# Patient Record
Sex: Female | Born: 1955 | Race: White | Hispanic: No | Marital: Married | Smoking: Former smoker
Health system: Southern US, Community
[De-identification: ages and names within clinical notes are randomized; demographics above are authoritative.]

## PROBLEM LIST (undated history)

## (undated) DIAGNOSIS — Z87442 Personal history of urinary calculi: Secondary | ICD-10-CM

## (undated) DIAGNOSIS — H269 Unspecified cataract: Secondary | ICD-10-CM

## (undated) DIAGNOSIS — H409 Unspecified glaucoma: Secondary | ICD-10-CM

## (undated) DIAGNOSIS — M810 Age-related osteoporosis without current pathological fracture: Secondary | ICD-10-CM

## (undated) DIAGNOSIS — F32A Depression, unspecified: Secondary | ICD-10-CM

## (undated) DIAGNOSIS — F329 Major depressive disorder, single episode, unspecified: Secondary | ICD-10-CM

## (undated) HISTORY — DX: Age-related osteoporosis without current pathological fracture: M81.0

## (undated) HISTORY — PX: HEMORRHOID SURGERY: SHX153

## (undated) HISTORY — PX: ABDOMINAL HYSTERECTOMY: SHX81

## (undated) HISTORY — DX: Major depressive disorder, single episode, unspecified: F32.9

## (undated) HISTORY — PX: CATARACT EXTRACTION: SUR2

## (undated) HISTORY — DX: Depression, unspecified: F32.A

## (undated) HISTORY — DX: Unspecified glaucoma: H40.9

## (undated) HISTORY — DX: Unspecified cataract: H26.9

---
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
## 2009-06-26 LAB — HM COLONOSCOPY

## 2010-05-29 ENCOUNTER — Ambulatory Visit: Payer: Self-pay | Admitting: Family Medicine

## 2010-06-12 ENCOUNTER — Ambulatory Visit: Payer: Self-pay | Admitting: Family Medicine

## 2010-06-20 ENCOUNTER — Ambulatory Visit: Payer: Self-pay | Admitting: Family Medicine

## 2011-07-01 ENCOUNTER — Ambulatory Visit: Payer: Self-pay | Admitting: Family Medicine

## 2011-07-03 ENCOUNTER — Ambulatory Visit: Payer: Self-pay | Admitting: Family Medicine

## 2012-01-08 ENCOUNTER — Ambulatory Visit: Payer: Self-pay | Admitting: Family Medicine

## 2012-07-28 ENCOUNTER — Ambulatory Visit: Payer: Self-pay | Admitting: Family Medicine

## 2013-07-12 ENCOUNTER — Ambulatory Visit: Payer: Self-pay | Admitting: Neurology

## 2013-07-29 ENCOUNTER — Ambulatory Visit: Payer: Self-pay | Admitting: Family Medicine

## 2013-09-13 DIAGNOSIS — G3184 Mild cognitive impairment, so stated: Secondary | ICD-10-CM | POA: Insufficient documentation

## 2014-08-01 ENCOUNTER — Ambulatory Visit
Admit: 2014-08-01 | Disposition: A | Discharge status: Home / Self Care | Payer: Self-pay | Attending: Family Medicine | Admitting: Family Medicine

## 2014-08-01 LAB — HM MAMMOGRAPHY

## 2014-10-28 ENCOUNTER — Other Ambulatory Visit: Payer: Self-pay | Admitting: Family Medicine

## 2015-01-18 ENCOUNTER — Encounter: Payer: Self-pay | Admitting: Family Medicine

## 2015-01-18 ENCOUNTER — Ambulatory Visit (INDEPENDENT_AMBULATORY_CARE_PROVIDER_SITE_OTHER): Payer: 59 | Admitting: Family Medicine

## 2015-01-18 VITALS — BP 130/83 | HR 67 | Temp 98.3°F | Ht 62.0 in | Wt 140.6 lb

## 2015-01-18 DIAGNOSIS — F329 Major depressive disorder, single episode, unspecified: Secondary | ICD-10-CM | POA: Diagnosis not present

## 2015-01-18 DIAGNOSIS — F32A Depression, unspecified: Secondary | ICD-10-CM

## 2015-01-18 DIAGNOSIS — Z23 Encounter for immunization: Secondary | ICD-10-CM

## 2015-01-18 DIAGNOSIS — F339 Major depressive disorder, recurrent, unspecified: Secondary | ICD-10-CM | POA: Insufficient documentation

## 2015-01-18 MED ORDER — OLANZAPINE 2.5 MG PO TABS: 2.5000 mg | ORAL_TABLET | Freq: Every day | ORAL | Status: DC

## 2015-01-18 MED ORDER — FLUOXETINE HCL 10 MG PO CAPS: 10.0000 mg | ORAL_CAPSULE | Freq: Every day | ORAL | Status: DC

## 2015-01-18 NOTE — Assessment & Plan Note (Signed)
Discussed depression care and treatment tapering medications will continue Lamictal 200 mg Will separate olanzapine and fluoxetine and 2 separate components at decreased dose to take olanzapine 2.5 mg a day fluoxetine 10 mg once a day. Patient will continue slow taper as possible.

## 2015-01-18 NOTE — Progress Notes (Signed)
BP 130/83 mmHg  Pulse 67  Temp(Src) 98.3 F (36.8 C)  Ht 5\' 2"  (1.575 m)  Wt 140 lb 9.6 oz (63.776 kg)  BMI 25.71 kg/m2  SpO2 97%   Subjective:    Patient ID: Rhonda Reeves, female    DOB: 22-Dec-1955, 59 y.o.   MRN: [id number]  HPI: Rhonda Reeves is a 59 y.o. female  Chief Complaint  Patient presents with  . Depression    Pt. reports no problems   Depression doing well. Patient sleeping well with no complaints Is retired so no job hassles. But is looking for a job. Has been tapering medications continuing Lamictal 200 mg and decreasing olanzapine and fluoxetine combination 6/25-1 tablet every other day. Wants to continue tapering as she is been doing really well. In family says she is doing well also.  Relevant past medical, surgical, family and social history reviewed and updated as indicated. Interim medical history since our last visit reviewed. Allergies and medications reviewed and updated.  Review of Systems  Constitutional: Negative.  Negative for fatigue.  HENT: Negative.   Respiratory: Negative.   Cardiovascular: Negative.   Psychiatric/Behavioral: Negative for suicidal ideas, behavioral problems, sleep disturbance, dysphoric mood, decreased concentration and agitation. The patient is not nervous/anxious.     Per HPI unless specifically indicated above     Objective:    BP 130/83 mmHg  Pulse 67  Temp(Src) 98.3 F (36.8 C)  Ht 5\' 2"  (1.575 m)  Wt 140 lb 9.6 oz (63.776 kg)  BMI 25.71 kg/m2  SpO2 97%  Wt Readings from Last 3 Encounters:  01/18/15 140 lb 9.6 oz (63.776 kg)  01/17/15 148 lb (67.132 kg)    Physical Exam  Constitutional: She is oriented to person, place, and time. She appears well-developed and well-nourished. No distress.  HENT:  Head: Normocephalic and atraumatic.  Right Ear: Hearing normal.  Left Ear: Hearing normal.  Nose: Nose normal.  Eyes: Conjunctivae and lids are normal. Right eye exhibits no discharge. Left eye exhibits  no discharge. No scleral icterus.  Cardiovascular: Normal rate, regular rhythm and normal heart sounds.   Pulmonary/Chest: Effort normal and breath sounds normal. No respiratory distress.  Musculoskeletal: Normal range of motion.  Neurological: She is alert and oriented to person, place, and time.  Skin: Skin is intact. No rash noted.  Psychiatric: She has a normal mood and affect. Her speech is normal and behavior is normal. Judgment and thought content normal. Cognition and memory are normal.    Results for orders placed or performed in visit on 01/17/15  HM MAMMOGRAPHY  Result Value Ref Range   HM Mammogram PP   HM COLONOSCOPY  Result Value Ref Range   HM Colonoscopy PP       Assessment & Plan:   Problem List Items Addressed This Visit      Other   Depression    Discussed depression care and treatment tapering medications will continue Lamictal 200 mg Will separate olanzapine and fluoxetine and 2 separate components at decreased dose to take olanzapine 2.5 mg a day fluoxetine 10 mg once a day. Patient will continue slow taper as possible.      Relevant Medications   OLANZapine (ZYPREXA) 2.5 MG tablet   FLUoxetine (PROZAC) 10 MG capsule    Other Visit Diagnoses    Need for influenza vaccination    -  Primary    Relevant Orders    Flu Vaccine QUAD 36+ mos PF IM (Fluarix & Fluzone Quad  PF) (Completed)        Follow up plan: Return in about 6 months (around 07/18/2015), or if symptoms worsen or fail to improve, for Physical Exam.

## 2015-03-29 ENCOUNTER — Ambulatory Visit (INDEPENDENT_AMBULATORY_CARE_PROVIDER_SITE_OTHER): Payer: 59

## 2015-03-29 DIAGNOSIS — Z23 Encounter for immunization: Secondary | ICD-10-CM

## 2015-04-05 ENCOUNTER — Ambulatory Visit: Payer: 59

## 2015-07-03 ENCOUNTER — Ambulatory Visit (INDEPENDENT_AMBULATORY_CARE_PROVIDER_SITE_OTHER): Payer: BLUE CROSS/BLUE SHIELD | Admitting: Family Medicine

## 2015-07-03 ENCOUNTER — Encounter: Payer: Self-pay | Admitting: Family Medicine

## 2015-07-03 VITALS — BP 124/80 | HR 70 | Temp 98.0°F | Ht 61.3 in | Wt 132.0 lb

## 2015-07-03 DIAGNOSIS — F329 Major depressive disorder, single episode, unspecified: Secondary | ICD-10-CM

## 2015-07-03 DIAGNOSIS — F32A Depression, unspecified: Secondary | ICD-10-CM

## 2015-07-03 MED ORDER — FLUOXETINE HCL 10 MG PO CAPS: 10.0000 mg | ORAL_CAPSULE | Freq: Every day | ORAL | Status: DC

## 2015-07-03 MED ORDER — OLANZAPINE 2.5 MG PO TABS: 2.5000 mg | ORAL_TABLET | Freq: Every day | ORAL | Status: DC

## 2015-07-03 MED ORDER — LAMOTRIGINE 200 MG PO TABS: ORAL_TABLET | ORAL | Status: DC

## 2015-07-03 NOTE — Assessment & Plan Note (Signed)
Patient stable doing well will stop Zyprexa after review with patient patient will hold Zyprexa in her medicine cabinet and if gets any signs of increasing irritability and poor sleep etc. Will restart Continue other medications long-term

## 2015-07-03 NOTE — Progress Notes (Signed)
   BP 124/80 mmHg  Pulse 70  Temp(Src) 98 F (36.7 C)  Ht 5' 1.3" (1.557 m)  Wt 132 lb (59.875 kg)  BMI 24.70 kg/m2  SpO2 99%   Subjective:    Patient ID: Rhonda Reeves, female    DOB: 1956/03/03, 60 y.o.   MRN: [id number]  HPI: Rhonda Reeves is a 60 y.o. female  Chief Complaint  Patient presents with  . 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 Patient wants to continue reducing dose. Relevant past medical, surgical, family and social history reviewed and updated as indicated. Interim medical history since our last visit reviewed. Allergies and medications reviewed and updated.  Review of Systems  Constitutional: Negative.   Respiratory: Negative.   Cardiovascular: Negative.     Per HPI unless specifically indicated above     Objective:    BP 124/80 mmHg  Pulse 70  Temp(Src) 98 F (36.7 C)  Ht 5' 1.3" (1.557 m)  Wt 132 lb (59.875 kg)  BMI 24.70 kg/m2  SpO2 99%  Wt Readings from Last 3 Encounters:  07/03/15 132 lb (59.875 kg)  01/18/15 140 lb 9.6 oz (63.776 kg)  01/17/15 148 lb (67.132 kg)    Physical Exam  Constitutional: She is oriented to person, place, and time. She appears well-developed and well-nourished. No distress.  HENT:  Head: Normocephalic and atraumatic.  Right Ear: Hearing normal.  Left Ear: Hearing normal.  Nose: Nose normal.  Eyes: Conjunctivae and lids are normal. Right eye exhibits no discharge. Left eye exhibits no discharge. No scleral icterus.  Cardiovascular: Normal rate, regular rhythm and normal heart sounds.   Pulmonary/Chest: Effort normal and breath sounds normal. No respiratory distress.  Musculoskeletal: Normal range of  motion.  Neurological: She is alert and oriented to person, place, and time.  Skin: Skin is intact. No rash noted.  Psychiatric: She has a normal mood and affect. Her speech is normal and behavior is normal. Judgment and thought content normal. Cognition and memory are normal.    Results for orders placed or performed in visit on 01/17/15  HM MAMMOGRAPHY  Result Value Ref Range   HM Mammogram PP   HM COLONOSCOPY  Result Value Ref Range   HM Colonoscopy PP       Assessment & Plan:   Problem List Items Addressed This Visit      Other   Depression - Primary    Patient stable doing well will stop Zyprexa after review with patient patient will hold Zyprexa in her medicine cabinet and if gets any signs of increasing irritability and poor sleep etc. Will restart Continue other medications long-term      Relevant Medications   FLUoxetine (PROZAC) 10 MG capsule   OLANZapine (ZYPREXA) 2.5 MG tablet       Follow up plan: Return for Physical Exam spring.

## 2015-07-05 ENCOUNTER — Encounter: Payer: 59 | Admitting: Family Medicine

## 2015-07-20 ENCOUNTER — Encounter: Payer: Self-pay | Admitting: Family Medicine

## 2015-10-18 ENCOUNTER — Encounter: Payer: BLUE CROSS/BLUE SHIELD | Admitting: Family Medicine

## 2016-01-08 ENCOUNTER — Encounter (INDEPENDENT_AMBULATORY_CARE_PROVIDER_SITE_OTHER): Payer: Self-pay

## 2016-01-30 ENCOUNTER — Encounter: Payer: Self-pay | Admitting: Family Medicine

## 2016-01-30 ENCOUNTER — Ambulatory Visit (INDEPENDENT_AMBULATORY_CARE_PROVIDER_SITE_OTHER): Payer: BLUE CROSS/BLUE SHIELD | Admitting: Family Medicine

## 2016-01-30 VITALS — BP 108/67 | HR 78 | Temp 98.0°F | Ht 61.5 in | Wt 126.8 lb

## 2016-01-30 DIAGNOSIS — Z0000 Encounter for general adult medical examination without abnormal findings: Secondary | ICD-10-CM

## 2016-01-30 DIAGNOSIS — F3341 Major depressive disorder, recurrent, in partial remission: Secondary | ICD-10-CM | POA: Diagnosis not present

## 2016-01-30 DIAGNOSIS — Z23 Encounter for immunization: Secondary | ICD-10-CM

## 2016-01-30 LAB — MICROSCOPIC EXAMINATION

## 2016-01-30 LAB — URINALYSIS, ROUTINE W REFLEX MICROSCOPIC
Bilirubin, UA: NEGATIVE
Glucose, UA: NEGATIVE
Ketones, UA: NEGATIVE
Nitrite, UA: NEGATIVE
Protein, UA: NEGATIVE
RBC, UA: NEGATIVE
Specific Gravity, UA: 1.01 (ref 1.005–1.030)
Urobilinogen, Ur: 0.2 mg/dL (ref 0.2–1.0)
pH, UA: 7 (ref 5.0–7.5)

## 2016-01-30 MED ORDER — LAMOTRIGINE 200 MG PO TABS: ORAL_TABLET | ORAL | 4 refills | Status: DC

## 2016-01-30 MED ORDER — OLANZAPINE 2.5 MG PO TABS: 2.5000 mg | ORAL_TABLET | Freq: Every day | ORAL | 4 refills | Status: DC

## 2016-01-30 MED ORDER — FLUOXETINE HCL 10 MG PO CAPS: 10.0000 mg | ORAL_CAPSULE | Freq: Every day | ORAL | 4 refills | Status: DC

## 2016-01-30 NOTE — Assessment & Plan Note (Signed)
Discussed with patient Will get back up on Zyprexa 2.5 one a day

## 2016-01-30 NOTE — Progress Notes (Signed)
BP 108/67 (BP Location: Left Arm, Patient Position: Sitting, Cuff Size: Normal)   Pulse 78   Temp 98 F (36.7 C)   Ht 5' 1.5" (1.562 m)   Wt 126 lb 12.8 oz (57.5 kg)   SpO2 98%   BMI 23.57 kg/m    Subjective:    Patient ID: Rhonda Reeves, female    DOB: 1955/06/27, 60 y.o.   MRN: [id number]  HPI: Rhonda Reeves is a 60 y.o. female  Chief Complaint  Patient presents with  . Annual Exam   Review patient has been trying to taper off her nerve medications has been taking Zyprexa 2.5 every other day. Patient has noticed recurrence of her symptoms of irritability and poor sleep and an severe frustration. Otherwise doing well is continuing on motor Romie Minus and fluoxetine. Has done really well over the last year with with almost 20 pounds of weight loss. Relevant past medical, surgical, family and social history reviewed and updated as indicated. Interim medical history since our last visit reviewed. Allergies and medications reviewed and updated.  Review of Systems  Constitutional: Negative.   HENT: Negative.   Eyes: Negative.   Respiratory: Negative.   Cardiovascular: Negative.   Gastrointestinal: Negative.   Endocrine: Negative.   Genitourinary: Negative.   Musculoskeletal: Negative.   Skin: Negative.   Allergic/Immunologic: Negative.   Neurological: Negative.   Hematological: Negative.   Psychiatric/Behavioral: Negative.     Per HPI unless specifically indicated above     Objective:    BP 108/67 (BP Location: Left Arm, Patient Position: Sitting, Cuff Size: Normal)   Pulse 78   Temp 98 F (36.7 C)   Ht 5' 1.5" (1.562 m)   Wt 126 lb 12.8 oz (57.5 kg)   SpO2 98%   BMI 23.57 kg/m   Wt Readings from Last 3 Encounters:  01/30/16 126 lb 12.8 oz (57.5 kg)  07/03/15 132 lb (59.9 kg)  01/18/15 140 lb 9.6 oz (63.8 kg)    Physical Exam  Constitutional: She is oriented to person, place, and time. She appears well-developed and well-nourished.  HENT:  Head:  Normocephalic and atraumatic.  Right Ear: External ear normal.  Left Ear: External ear normal.  Nose: Nose normal.  Mouth/Throat: Oropharynx is clear and moist.  Eyes: Conjunctivae and EOM are normal. Pupils are equal, round, and reactive to light.  Neck: Normal range of motion. Neck supple. Carotid bruit is not present.  Cardiovascular: Normal rate, regular rhythm and normal heart sounds.   No murmur heard. Pulmonary/Chest: Effort normal and breath sounds normal. She exhibits no mass. Right breast exhibits no mass, no skin change and no tenderness. Left breast exhibits no mass, no skin change and no tenderness. Breasts are symmetrical.  Abdominal: Soft. Bowel sounds are normal. There is no hepatosplenomegaly.  Musculoskeletal: Normal range of motion.  Neurological: She is alert and oriented to person, place, and time.  Skin: No rash noted.  Psychiatric: She has a normal mood and affect. Her behavior is normal. Judgment and thought content normal.        Assessment & Plan:   Problem List Items Addressed This Visit      Other   Depression    Discussed with patient Will get back up on Zyprexa 2.5 one a day       Relevant Medications   OLANZapine (ZYPREXA) 2.5 MG tablet   lamoTRIgine (LAMICTAL) 200 MG tablet   FLUoxetine (PROZAC) 10 MG capsule    Other Visit Diagnoses  Need for influenza vaccination    -  Primary   Relevant Orders   Flu Vaccine QUAD 36+ mos PF IM (Fluarix & Fluzone Quad PF) (Completed)   Annual physical exam       Relevant Orders   CBC with Differential/Platelet   Comprehensive metabolic panel   Lipid panel   TSH   Urinalysis, Routine w reflex microscopic (not at [facility])       Follow up plan: Return in about 6 months (around 07/30/2016) for med check.

## 2016-01-31 ENCOUNTER — Encounter: Payer: Self-pay | Admitting: Family Medicine

## 2016-01-31 LAB — CBC WITH DIFFERENTIAL/PLATELET
Basophils Absolute: 0 10*3/uL (ref 0.0–0.2)
Basos: 0 %
EOS (ABSOLUTE): 0.2 10*3/uL (ref 0.0–0.4)
Eos: 3 %
Hematocrit: 41.6 % (ref 34.0–46.6)
Hemoglobin: 14.2 g/dL (ref 11.1–15.9)
Immature Grans (Abs): 0 10*3/uL (ref 0.0–0.1)
Immature Granulocytes: 0 %
Lymphocytes Absolute: 2.5 10*3/uL (ref 0.7–3.1)
Lymphs: 35 %
MCH: 30.6 pg (ref 26.6–33.0)
MCHC: 34.1 g/dL (ref 31.5–35.7)
MCV: 90 fL (ref 79–97)
Monocytes Absolute: 0.5 10*3/uL (ref 0.1–0.9)
Monocytes: 6 %
Neutrophils Absolute: 3.9 10*3/uL (ref 1.4–7.0)
Neutrophils: 56 %
Platelets: 285 10*3/uL (ref 150–379)
RBC: 4.64 x10E6/uL (ref 3.77–5.28)
RDW: 12.8 % (ref 12.3–15.4)
WBC: 7.1 10*3/uL (ref 3.4–10.8)

## 2016-01-31 LAB — COMPREHENSIVE METABOLIC PANEL
ALT: 18 IU/L (ref 0–32)
AST: 19 IU/L (ref 0–40)
Albumin/Globulin Ratio: 2.3 — ABNORMAL HIGH (ref 1.2–2.2)
Albumin: 4.9 g/dL — ABNORMAL HIGH (ref 3.6–4.8)
Alkaline Phosphatase: 61 IU/L (ref 39–117)
BUN/Creatinine Ratio: 21 (ref 12–28)
BUN: 19 mg/dL (ref 8–27)
Bilirubin Total: 0.2 mg/dL (ref 0.0–1.2)
CO2: 26 mmol/L (ref 18–29)
Calcium: 9.7 mg/dL (ref 8.7–10.3)
Chloride: 102 mmol/L (ref 96–106)
Creatinine, Ser: 0.9 mg/dL (ref 0.57–1.00)
GFR calc Af Amer: 80 mL/min/{1.73_m2} (ref >59)
GFR calc non Af Amer: 69 mL/min/{1.73_m2} (ref >59)
Globulin, Total: 2.1 g/dL (ref 1.5–4.5)
Glucose: 79 mg/dL (ref 65–99)
Potassium: 4.2 mmol/L (ref 3.5–5.2)
Sodium: 145 mmol/L — ABNORMAL HIGH (ref 134–144)
Total Protein: 7 g/dL (ref 6.0–8.5)

## 2016-01-31 LAB — LIPID PANEL
Chol/HDL Ratio: 2.8 ratio units (ref 0.0–4.4)
Cholesterol, Total: 223 mg/dL — ABNORMAL HIGH (ref 100–199)
HDL: 79 mg/dL (ref >39)
LDL Calculated: 131 mg/dL — ABNORMAL HIGH (ref 0–99)
Triglycerides: 67 mg/dL (ref 0–149)
VLDL Cholesterol Cal: 13 mg/dL (ref 5–40)

## 2016-01-31 LAB — TSH: TSH: 1.33 u[IU]/mL (ref 0.450–4.500)

## 2016-03-10 ENCOUNTER — Telehealth: Payer: Self-pay | Admitting: Family Medicine

## 2016-03-10 NOTE — Telephone Encounter (Signed)
Rhonda Reeves from [facility] called in regards to the office visit on 01/30/16. The pt stated this was suppose to be coded as a CPE but it isn't going through to [facility] as a cpe.

## 2016-03-10 NOTE — Telephone Encounter (Signed)
Alwyn Ren can you assist with this?

## 2016-03-18 ENCOUNTER — Encounter: Payer: Self-pay | Admitting: Family Medicine

## 2016-03-18 ENCOUNTER — Ambulatory Visit (INDEPENDENT_AMBULATORY_CARE_PROVIDER_SITE_OTHER): Payer: BLUE CROSS/BLUE SHIELD | Admitting: Family Medicine

## 2016-03-18 DIAGNOSIS — F3341 Major depressive disorder, recurrent, in partial remission: Secondary | ICD-10-CM

## 2016-03-18 MED ORDER — LAMOTRIGINE 200 MG PO TABS: ORAL_TABLET | ORAL | 4 refills | Status: DC

## 2016-03-18 MED ORDER — FLUOXETINE HCL 10 MG PO CAPS: 10.0000 mg | ORAL_CAPSULE | Freq: Every day | ORAL | 4 refills | Status: DC

## 2016-03-18 MED ORDER — OLANZAPINE 2.5 MG PO TABS
2.5000 mg | ORAL_TABLET | Freq: Every day | ORAL | 4 refills | Status: DC
Start: 2016-03-18 — End: 2017-02-19

## 2016-03-18 NOTE — Assessment & Plan Note (Signed)
Reviewed medication dosing and treatment will continue current medications long-term

## 2016-03-18 NOTE — Progress Notes (Signed)
BP 122/81 (BP Location: Right Arm, Patient Position: Sitting, Cuff Size: Normal)   Pulse 82   Temp 98.6 F (37 C)   Wt 127 lb 3.2 oz (57.7 kg)   SpO2 97%   BMI 23.65 kg/m    Subjective:    Patient ID: Rhonda Reeves, female    DOB: 12-13-1955, 60 y.o.   MRN: [id number]  HPI: Rhonda Reeves is a 60 y.o. female  Chief Complaint  Patient presents with  . Medication follow up    pt states that she is changing insurances for next year and needs to change her pharmacy to a mail order pharmacy instead of a drug store,   Patient needs update on medications for above has been doing well reviewed medications. Discuss dangers of stopping medication and patient agrees and remembers the 2 previous times she stopped her medications Reviewed dosing Relevant past medical, surgical, family and social history reviewed and updated as indicated. Interim medical history since our last visit reviewed. Allergies and medications reviewed and updated.  Review of Systems  Constitutional: Negative.   Respiratory: Negative.   Cardiovascular: Negative.     Per HPI unless specifically indicated above     Objective:    BP 122/81 (BP Location: Right Arm, Patient Position: Sitting, Cuff Size: Normal)   Pulse 82   Temp 98.6 F (37 C)   Wt 127 lb 3.2 oz (57.7 kg)   SpO2 97%   BMI 23.65 kg/m   Wt Readings from Last 3 Encounters:  03/18/16 127 lb 3.2 oz (57.7 kg)  01/30/16 126 lb 12.8 oz (57.5 kg)  07/03/15 132 lb (59.9 kg)    Physical Exam  Constitutional: She is oriented to person, place, and time. She appears well-developed and well-nourished. No distress.  HENT:  Head: Normocephalic and atraumatic.  Right Ear: Hearing normal.  Left Ear: Hearing normal.  Nose: Nose normal.  Eyes: Conjunctivae and lids are normal. Right eye exhibits no discharge. Left eye exhibits no discharge. No scleral icterus.  Pulmonary/Chest: Effort normal. No respiratory distress.  Musculoskeletal: Normal range of  motion.  Neurological: She is alert and oriented to person, place, and time.  Skin: Skin is intact. No rash noted.  Psychiatric: She has a normal mood and affect. Her speech is normal and behavior is normal. Judgment and thought content normal. Cognition and memory are normal.    Results for orders placed or performed in visit on 01/30/16  Microscopic Examination  Result Value Ref Range   WBC, UA 6-10 (A) 0 - 5 /hpf   RBC, UA 0-2 0 - 2 /hpf   Epithelial Cells (non renal) 0-10 0 - 10 /hpf   Bacteria, UA Few None seen/Few  CBC with Differential/Platelet  Result Value Ref Range   WBC 7.1 3.4 - 10.8 x10E3/uL   RBC 4.64 3.77 - 5.28 x10E6/uL   Hemoglobin 14.2 11.1 - 15.9 g/dL   Hematocrit 41.6 34.0 - 46.6 %   MCV 90 79 - 97 fL   MCH 30.6 26.6 - 33.0 pg   MCHC 34.1 31.5 - 35.7 g/dL   RDW 12.8 12.3 - 15.4 %   Platelets 285 150 - 379 x10E3/uL   Neutrophils 56 Not Estab. %   Lymphs 35 Not Estab. %   Monocytes 6 Not Estab. %   Eos 3 Not Estab. %   Basos 0 Not Estab. %   Neutrophils Absolute 3.9 1.4 - 7.0 x10E3/uL   Lymphocytes Absolute 2.5 0.7 - 3.1 x10E3/uL  Monocytes Absolute 0.5 0.1 - 0.9 x10E3/uL   EOS (ABSOLUTE) 0.2 0.0 - 0.4 x10E3/uL   Basophils Absolute 0.0 0.0 - 0.2 x10E3/uL   Immature Granulocytes 0 Not Estab. %   Immature Grans (Abs) 0.0 0.0 - 0.1 x10E3/uL  Comprehensive metabolic panel  Result Value Ref Range   Glucose 79 65 - 99 mg/dL   BUN 19 8 - 27 mg/dL   Creatinine, Ser 0.90 0.57 - 1.00 mg/dL   GFR calc non Af Amer 69 >59 mL/min/1.73   GFR calc Af Amer 80 >59 mL/min/1.73   BUN/Creatinine Ratio 21 12 - 28   Sodium 145 (H) 134 - 144 mmol/L   Potassium 4.2 3.5 - 5.2 mmol/L   Chloride 102 96 - 106 mmol/L   CO2 26 18 - 29 mmol/L   Calcium 9.7 8.7 - 10.3 mg/dL   Total Protein 7.0 6.0 - 8.5 g/dL   Albumin 4.9 (H) 3.6 - 4.8 g/dL   Globulin, Total 2.1 1.5 - 4.5 g/dL   Albumin/Globulin Ratio 2.3 (H) 1.2 - 2.2   Bilirubin Total 0.2 0.0 - 1.2 mg/dL   Alkaline  Phosphatase 61 39 - 117 IU/L   AST 19 0 - 40 IU/L   ALT 18 0 - 32 IU/L  Lipid panel  Result Value Ref Range   Cholesterol, Total 223 (H) 100 - 199 mg/dL   Triglycerides 67 0 - 149 mg/dL   HDL 79 >39 mg/dL   VLDL Cholesterol Cal 13 5 - 40 mg/dL   LDL Calculated 131 (H) 0 - 99 mg/dL   Chol/HDL Ratio 2.8 0.0 - 4.4 ratio units  TSH  Result Value Ref Range   TSH 1.330 0.450 - 4.500 uIU/mL  Urinalysis, Routine w reflex microscopic (not at [facility])  Result Value Ref Range   Specific Gravity, UA 1.010 1.005 - 1.030   pH, UA 7.0 5.0 - 7.5   Color, UA Yellow Yellow   Appearance Ur Clear Clear   Leukocytes, UA 3+ (A) Negative   Protein, UA Negative Negative/Trace   Glucose, UA Negative Negative   Ketones, UA Negative Negative   RBC, UA Negative Negative   Bilirubin, UA Negative Negative   Urobilinogen, Ur 0.2 0.2 - 1.0 mg/dL   Nitrite, UA Negative Negative   Microscopic Examination See below:       Assessment & Plan:   Problem List Items Addressed This Visit      Other   Depression    Reviewed medication dosing and treatment will continue current medications long-term      Relevant Medications   OLANZapine (ZYPREXA) 2.5 MG tablet   lamoTRIgine (LAMICTAL) 200 MG tablet   FLUoxetine (PROZAC) 10 MG capsule       Follow up plan: Return in about 6 months (around 09/15/2016) for BMP.

## 2016-07-16 ENCOUNTER — Encounter: Payer: Self-pay | Admitting: Family Medicine

## 2016-07-30 ENCOUNTER — Ambulatory Visit: Payer: BLUE CROSS/BLUE SHIELD | Admitting: Family Medicine

## 2016-08-05 ENCOUNTER — Encounter: Payer: Self-pay | Admitting: Family Medicine

## 2016-08-05 ENCOUNTER — Ambulatory Visit (INDEPENDENT_AMBULATORY_CARE_PROVIDER_SITE_OTHER): Payer: Self-pay | Admitting: Family Medicine

## 2016-08-05 VITALS — BP 112/75 | HR 73 | Ht 62.0 in | Wt 128.0 lb

## 2016-08-05 DIAGNOSIS — Z79899 Other long term (current) drug therapy: Secondary | ICD-10-CM

## 2016-08-05 DIAGNOSIS — F3342 Major depressive disorder, recurrent, in full remission: Secondary | ICD-10-CM

## 2016-08-05 NOTE — Assessment & Plan Note (Signed)
The current medical regimen is effective;  continue present plan and medications.  

## 2016-08-05 NOTE — Progress Notes (Signed)
BP 112/75   Pulse 73   Ht 5\' 2"  (1.575 m)   Wt 128 lb (58.1 kg)   SpO2 97%   BMI 23.41 kg/m    Subjective:    Patient ID: Rhonda Reeves, female    DOB: October 23, 1955, 61 y.o.   MRN: [id number]  HPI: Rhonda Reeves is a 61 y.o. female  Chief Complaint  Patient presents with  . Follow-up  Patient doing well with medications in spite of incredible home stress. Able to sleep and get through the day without problems. Patient looking for a new job parents moved into an assisted living facility.  Relevant past medical, surgical, family and social history reviewed and updated as indicated. Interim medical history since our last visit reviewed. Allergies and medications reviewed and updated.  Review of Systems  Constitutional: Negative.   Respiratory: Negative.   Cardiovascular: Negative.     Per HPI unless specifically indicated above     Objective:    BP 112/75   Pulse 73   Ht 5\' 2"  (1.575 m)   Wt 128 lb (58.1 kg)   SpO2 97%   BMI 23.41 kg/m   Wt Readings from Last 3 Encounters:  08/05/16 128 lb (58.1 kg)  03/18/16 127 lb 3.2 oz (57.7 kg)  01/30/16 126 lb 12.8 oz (57.5 kg)    Physical Exam  Constitutional: She is oriented to person, place, and time. She appears well-developed and well-nourished.  HENT:  Head: Normocephalic and atraumatic.  Eyes: Conjunctivae and EOM are normal.  Neck: Normal range of motion.  Cardiovascular: Normal rate, regular rhythm and normal heart sounds.   Pulmonary/Chest: Effort normal and breath sounds normal.  Musculoskeletal: Normal range of motion.  Neurological: She is alert and oriented to person, place, and time.  Skin: No erythema.  Psychiatric: She has a normal mood and affect. Her behavior is normal. Judgment and thought content normal.    Results for orders placed or performed in visit on 01/30/16  Microscopic Examination  Result Value Ref Range   WBC, UA 6-10 (A) 0 - 5 /hpf   RBC, UA 0-2 0 - 2 /hpf   Epithelial Cells (non  renal) 0-10 0 - 10 /hpf   Bacteria, UA Few None seen/Few  CBC with Differential/Platelet  Result Value Ref Range   WBC 7.1 3.4 - 10.8 x10E3/uL   RBC 4.64 3.77 - 5.28 x10E6/uL   Hemoglobin 14.2 11.1 - 15.9 g/dL   Hematocrit 41.6 34.0 - 46.6 %   MCV 90 79 - 97 fL   MCH 30.6 26.6 - 33.0 pg   MCHC 34.1 31.5 - 35.7 g/dL   RDW 12.8 12.3 - 15.4 %   Platelets 285 150 - 379 x10E3/uL   Neutrophils 56 Not Estab. %   Lymphs 35 Not Estab. %   Monocytes 6 Not Estab. %   Eos 3 Not Estab. %   Basos 0 Not Estab. %   Neutrophils Absolute 3.9 1.4 - 7.0 x10E3/uL   Lymphocytes Absolute 2.5 0.7 - 3.1 x10E3/uL   Monocytes Absolute 0.5 0.1 - 0.9 x10E3/uL   EOS (ABSOLUTE) 0.2 0.0 - 0.4 x10E3/uL   Basophils Absolute 0.0 0.0 - 0.2 x10E3/uL   Immature Granulocytes 0 Not Estab. %   Immature Grans (Abs) 0.0 0.0 - 0.1 x10E3/uL  Comprehensive metabolic panel  Result Value Ref Range   Glucose 79 65 - 99 mg/dL   BUN 19 8 - 27 mg/dL   Creatinine, Ser 0.90 0.57 - 1.00 mg/dL  GFR calc non Af Amer 69 >59 mL/min/1.73   GFR calc Af Amer 80 >59 mL/min/1.73   BUN/Creatinine Ratio 21 12 - 28   Sodium 145 (H) 134 - 144 mmol/L   Potassium 4.2 3.5 - 5.2 mmol/L   Chloride 102 96 - 106 mmol/L   CO2 26 18 - 29 mmol/L   Calcium 9.7 8.7 - 10.3 mg/dL   Total Protein 7.0 6.0 - 8.5 g/dL   Albumin 4.9 (H) 3.6 - 4.8 g/dL   Globulin, Total 2.1 1.5 - 4.5 g/dL   Albumin/Globulin Ratio 2.3 (H) 1.2 - 2.2   Bilirubin Total 0.2 0.0 - 1.2 mg/dL   Alkaline Phosphatase 61 39 - 117 IU/L   AST 19 0 - 40 IU/L   ALT 18 0 - 32 IU/L  Lipid panel  Result Value Ref Range   Cholesterol, Total 223 (H) 100 - 199 mg/dL   Triglycerides 67 0 - 149 mg/dL   HDL 79 >39 mg/dL   VLDL Cholesterol Cal 13 5 - 40 mg/dL   LDL Calculated 131 (H) 0 - 99 mg/dL   Chol/HDL Ratio 2.8 0.0 - 4.4 ratio units  TSH  Result Value Ref Range   TSH 1.330 0.450 - 4.500 uIU/mL  Urinalysis, Routine w reflex microscopic (not at [facility])  Result Value Ref Range    Specific Gravity, UA 1.010 1.005 - 1.030   pH, UA 7.0 5.0 - 7.5   Color, UA Yellow Yellow   Appearance Ur Clear Clear   Leukocytes, UA 3+ (A) Negative   Protein, UA Negative Negative/Trace   Glucose, UA Negative Negative   Ketones, UA Negative Negative   RBC, UA Negative Negative   Bilirubin, UA Negative Negative   Urobilinogen, Ur 0.2 0.2 - 1.0 mg/dL   Nitrite, UA Negative Negative   Microscopic Examination See below:       Assessment & Plan:   Problem List Items Addressed This Visit      Other   Depression    The current medical regimen is effective;  continue present plan and medications.        Other Visit Diagnoses    Medication management    -  Primary       Follow up plan: Return in about 6 months (around 02/04/2017) for Medicine  check.

## 2017-02-05 ENCOUNTER — Ambulatory Visit: Payer: Self-pay | Admitting: Family Medicine

## 2017-02-19 ENCOUNTER — Ambulatory Visit (INDEPENDENT_AMBULATORY_CARE_PROVIDER_SITE_OTHER): Payer: 59 | Admitting: Family Medicine

## 2017-02-19 ENCOUNTER — Encounter: Payer: Self-pay | Admitting: Family Medicine

## 2017-02-19 DIAGNOSIS — F419 Anxiety disorder, unspecified: Secondary | ICD-10-CM | POA: Diagnosis not present

## 2017-02-19 DIAGNOSIS — F3341 Major depressive disorder, recurrent, in partial remission: Secondary | ICD-10-CM

## 2017-02-19 MED ORDER — LAMOTRIGINE 200 MG PO TABS: ORAL_TABLET | ORAL | 1 refills | Status: DC

## 2017-02-19 MED ORDER — FLUOXETINE HCL 10 MG PO CAPS: 10.0000 mg | ORAL_CAPSULE | Freq: Every day | ORAL | 1 refills | Status: DC

## 2017-02-19 MED ORDER — OLANZAPINE 2.5 MG PO TABS: 2.5000 mg | ORAL_TABLET | Freq: Every day | ORAL | 1 refills | Status: DC

## 2017-02-19 NOTE — Addendum Note (Signed)
Addended by: Golden Pop A on: 02/19/2017 04:57 PM   Modules accepted: Orders

## 2017-02-19 NOTE — Progress Notes (Signed)
BP 130/84   Pulse 65   Wt 131 lb (59.4 kg)   SpO2 98%   BMI 23.96 kg/m    Subjective:    Patient ID: Rhonda Reeves, female    DOB: 23-Apr-1955, 61 y.o.   MRN: [id number]  HPI: Rhonda Reeves is a 61 y.o. female  Chief Complaint  Patient presents with  . Follow-up  Chronic depression anxiety. Patient doing well on medication. Having a lot of stress with father who is dying not eating in a nursing home mother also with dementia in the same nursing home. Patient's weight is staying stable is in a stable job now with benefits and ready to schedule for physical exam.  Relevant past medical, surgical, family and social history reviewed and updated as indicated. Interim medical history since our last visit reviewed. Allergies and medications reviewed and updated.  Review of Systems  Constitutional: Negative.   Respiratory: Negative.   Cardiovascular: Negative.     Per HPI unless specifically indicated above     Objective:    BP 130/84   Pulse 65   Wt 131 lb (59.4 kg)   SpO2 98%   BMI 23.96 kg/m   Wt Readings from Last 3 Encounters:  02/19/17 131 lb (59.4 kg)  08/05/16 128 lb (58.1 kg)  03/18/16 127 lb 3.2 oz (57.7 kg)    Physical Exam  Constitutional: She is oriented to person, place, and time. She appears well-developed and well-nourished.  HENT:  Head: Normocephalic and atraumatic.  Eyes: Conjunctivae and EOM are normal.  Neck: Normal range of motion.  Cardiovascular: Normal rate, regular rhythm and normal heart sounds.   Pulmonary/Chest: Effort normal and breath sounds normal.  Musculoskeletal: Normal range of motion.  Neurological: She is alert and oriented to person, place, and time.  Skin: No erythema.  Psychiatric: She has a normal mood and affect. Her behavior is normal. Judgment and thought content normal.    Results for orders placed or performed in visit on 01/30/16  Microscopic Examination  Result Value Ref Range   WBC, UA 6-10 (A) 0 - 5 /hpf    RBC, UA 0-2 0 - 2 /hpf   Epithelial Cells (non renal) 0-10 0 - 10 /hpf   Bacteria, UA Few None seen/Few  CBC with Differential/Platelet  Result Value Ref Range   WBC 7.1 3.4 - 10.8 x10E3/uL   RBC 4.64 3.77 - 5.28 x10E6/uL   Hemoglobin 14.2 11.1 - 15.9 g/dL   Hematocrit 41.6 34.0 - 46.6 %   MCV 90 79 - 97 fL   MCH 30.6 26.6 - 33.0 pg   MCHC 34.1 31.5 - 35.7 g/dL   RDW 12.8 12.3 - 15.4 %   Platelets 285 150 - 379 x10E3/uL   Neutrophils 56 Not Estab. %   Lymphs 35 Not Estab. %   Monocytes 6 Not Estab. %   Eos 3 Not Estab. %   Basos 0 Not Estab. %   Neutrophils Absolute 3.9 1.4 - 7.0 x10E3/uL   Lymphocytes Absolute 2.5 0.7 - 3.1 x10E3/uL   Monocytes Absolute 0.5 0.1 - 0.9 x10E3/uL   EOS (ABSOLUTE) 0.2 0.0 - 0.4 x10E3/uL   Basophils Absolute 0.0 0.0 - 0.2 x10E3/uL   Immature Granulocytes 0 Not Estab. %   Immature Grans (Abs) 0.0 0.0 - 0.1 x10E3/uL  Comprehensive metabolic panel  Result Value Ref Range   Glucose 79 65 - 99 mg/dL   BUN 19 8 - 27 mg/dL   Creatinine, Ser 0.90 0.57 -  1.00 mg/dL   GFR calc non Af Amer 69 >59 mL/min/1.73   GFR calc Af Amer 80 >59 mL/min/1.73   BUN/Creatinine Ratio 21 12 - 28   Sodium 145 (H) 134 - 144 mmol/L   Potassium 4.2 3.5 - 5.2 mmol/L   Chloride 102 96 - 106 mmol/L   CO2 26 18 - 29 mmol/L   Calcium 9.7 8.7 - 10.3 mg/dL   Total Protein 7.0 6.0 - 8.5 g/dL   Albumin 4.9 (H) 3.6 - 4.8 g/dL   Globulin, Total 2.1 1.5 - 4.5 g/dL   Albumin/Globulin Ratio 2.3 (H) 1.2 - 2.2   Bilirubin Total 0.2 0.0 - 1.2 mg/dL   Alkaline Phosphatase 61 39 - 117 IU/L   AST 19 0 - 40 IU/L   ALT 18 0 - 32 IU/L  Lipid panel  Result Value Ref Range   Cholesterol, Total 223 (H) 100 - 199 mg/dL   Triglycerides 67 0 - 149 mg/dL   HDL 79 >39 mg/dL   VLDL Cholesterol Cal 13 5 - 40 mg/dL   LDL Calculated 131 (H) 0 - 99 mg/dL   Chol/HDL Ratio 2.8 0.0 - 4.4 ratio units  TSH  Result Value Ref Range   TSH 1.330 0.450 - 4.500 uIU/mL  Urinalysis, Routine w reflex  microscopic (not at [facility])  Result Value Ref Range   Specific Gravity, UA 1.010 1.005 - 1.030   pH, UA 7.0 5.0 - 7.5   Color, UA Yellow Yellow   Appearance Ur Clear Clear   Leukocytes, UA 3+ (A) Negative   Protein, UA Negative Negative/Trace   Glucose, UA Negative Negative   Ketones, UA Negative Negative   RBC, UA Negative Negative   Bilirubin, UA Negative Negative   Urobilinogen, Ur 0.2 0.2 - 1.0 mg/dL   Nitrite, UA Negative Negative   Microscopic Examination See below:       Assessment & Plan:   Problem List Items Addressed This Visit      Other   Depression    The current medical regimen is effective;  continue present plan and medications.       Relevant Medications   FLUoxetine (PROZAC) 10 MG capsule   lamoTRIgine (LAMICTAL) 200 MG tablet   OLANZapine (ZYPREXA) 2.5 MG tablet   Anxiety    Discuss anxiety around caregiving death and dying parents. Patient has positive care and hospice involved already. Reviewed care with dementia also.      Relevant Medications   FLUoxetine (PROZAC) 10 MG capsule       Follow up plan: Return for Physical Exam.

## 2017-02-19 NOTE — Assessment & Plan Note (Signed)
Discuss anxiety around caregiving death and dying parents. Patient has positive care and hospice involved already. Reviewed care with dementia also.

## 2017-02-19 NOTE — Assessment & Plan Note (Signed)
The current medical regimen is effective;  continue present plan and medications.  

## 2017-03-31 ENCOUNTER — Ambulatory Visit: Payer: 59 | Admitting: Family Medicine

## 2017-03-31 ENCOUNTER — Encounter: Payer: Self-pay | Admitting: Family Medicine

## 2017-03-31 VITALS — BP 134/84 | HR 67 | Ht 62.99 in | Wt 131.0 lb

## 2017-03-31 DIAGNOSIS — Z114 Encounter for screening for human immunodeficiency virus [HIV]: Secondary | ICD-10-CM

## 2017-03-31 DIAGNOSIS — F3341 Major depressive disorder, recurrent, in partial remission: Secondary | ICD-10-CM

## 2017-03-31 DIAGNOSIS — Z1239 Encounter for other screening for malignant neoplasm of breast: Secondary | ICD-10-CM

## 2017-03-31 DIAGNOSIS — F419 Anxiety disorder, unspecified: Secondary | ICD-10-CM

## 2017-03-31 DIAGNOSIS — F3342 Major depressive disorder, recurrent, in full remission: Secondary | ICD-10-CM

## 2017-03-31 DIAGNOSIS — Z1159 Encounter for screening for other viral diseases: Secondary | ICD-10-CM

## 2017-03-31 DIAGNOSIS — Z0000 Encounter for general adult medical examination without abnormal findings: Secondary | ICD-10-CM

## 2017-03-31 LAB — URINALYSIS, ROUTINE W REFLEX MICROSCOPIC
Bilirubin, UA: NEGATIVE
Glucose, UA: NEGATIVE
Nitrite, UA: NEGATIVE
Protein, UA: NEGATIVE
RBC, UA: NEGATIVE
Specific Gravity, UA: 1.01 (ref 1.005–1.030)
Urobilinogen, Ur: 0.2 mg/dL (ref 0.2–1.0)
pH, UA: 7 (ref 5.0–7.5)

## 2017-03-31 LAB — MICROSCOPIC EXAMINATION: Bacteria, UA: NONE SEEN

## 2017-03-31 MED ORDER — OLANZAPINE 2.5 MG PO TABS: 2.5000 mg | ORAL_TABLET | Freq: Every day | ORAL | 4 refills | Status: DC

## 2017-03-31 MED ORDER — FLUOXETINE HCL 10 MG PO CAPS: 10.0000 mg | ORAL_CAPSULE | Freq: Every day | ORAL | 4 refills | Status: DC

## 2017-03-31 MED ORDER — LAMOTRIGINE 200 MG PO TABS: ORAL_TABLET | ORAL | 4 refills | Status: DC

## 2017-03-31 NOTE — Assessment & Plan Note (Signed)
The current medical regimen is effective;  continue present plan and medications.  

## 2017-03-31 NOTE — Progress Notes (Signed)
BP 134/84   Pulse 67   Ht 5' 2.99" (1.6 m)   Wt 131 lb (59.4 kg)   SpO2 99%   BMI 23.21 kg/m    Subjective:    Patient ID: Rhonda Reeves, female    DOB: 1955-07-11, 61 y.o.   MRN: [id number]  HPI: Rhonda Reeves is a 61 y.o. female  Chief Complaint  Patient presents with  . Annual Exam  Patient all in all doing well stress anxiety nerves are stable. Patient's had an extra stressful time with the passing of her father. Patient with no side effects from medications. Weight is been stable on Zyprexa. Sleep is doing okay.   Relevant past medical, surgical, family and social history reviewed and updated as indicated. Interim medical history since our last visit reviewed. Allergies and medications reviewed and updated.  Review of Systems  Constitutional: Negative.   HENT: Negative.   Eyes: Negative.   Respiratory: Negative.   Cardiovascular: Negative.   Gastrointestinal: Negative.   Endocrine: Negative.   Genitourinary: Negative.   Musculoskeletal: Negative.   Skin: Negative.   Allergic/Immunologic: Negative.   Neurological: Negative.   Hematological: Negative.   Psychiatric/Behavioral: Negative.     Per HPI unless specifically indicated above     Objective:    BP 134/84   Pulse 67   Ht 5' 2.99" (1.6 m)   Wt 131 lb (59.4 kg)   SpO2 99%   BMI 23.21 kg/m   Wt Readings from Last 3 Encounters:  03/31/17 131 lb (59.4 kg)  02/19/17 131 lb (59.4 kg)  08/05/16 128 lb (58.1 kg)    Physical Exam  Constitutional: She is oriented to person, place, and time. She appears well-developed and well-nourished.  HENT:  Head: Normocephalic and atraumatic.  Right Ear: External ear normal.  Left Ear: External ear normal.  Nose: Nose normal.  Mouth/Throat: Oropharynx is clear and moist.  Eyes: Conjunctivae and EOM are normal. Pupils are equal, round, and reactive to light.  Neck: Normal range of motion. Neck supple. Carotid bruit is not present.  Cardiovascular: Normal  rate, regular rhythm and normal heart sounds.  No murmur heard. Pulmonary/Chest: Effort normal and breath sounds normal. She exhibits no mass. Right breast exhibits no mass, no skin change and no tenderness. Left breast exhibits no mass, no skin change and no tenderness. Breasts are symmetrical.  Abdominal: Soft. Bowel sounds are normal. There is no hepatosplenomegaly.  Musculoskeletal: Normal range of motion.  Neurological: She is alert and oriented to person, place, and time.  Skin: No rash noted.  Psychiatric: She has a normal mood and affect. Her behavior is normal. Judgment and thought content normal.    Results for orders placed or performed in visit on 01/30/16  Microscopic Examination  Result Value Ref Range   WBC, UA 6-10 (A) 0 - 5 /hpf   RBC, UA 0-2 0 - 2 /hpf   Epithelial Cells (non renal) 0-10 0 - 10 /hpf   Bacteria, UA Few None seen/Few  CBC with Differential/Platelet  Result Value Ref Range   WBC 7.1 3.4 - 10.8 x10E3/uL   RBC 4.64 3.77 - 5.28 x10E6/uL   Hemoglobin 14.2 11.1 - 15.9 g/dL   Hematocrit 41.6 34.0 - 46.6 %   MCV 90 79 - 97 fL   MCH 30.6 26.6 - 33.0 pg   MCHC 34.1 31.5 - 35.7 g/dL   RDW 12.8 12.3 - 15.4 %   Platelets 285 150 - 379 x10E3/uL   Neutrophils  56 Not Estab. %   Lymphs 35 Not Estab. %   Monocytes 6 Not Estab. %   Eos 3 Not Estab. %   Basos 0 Not Estab. %   Neutrophils Absolute 3.9 1.4 - 7.0 x10E3/uL   Lymphocytes Absolute 2.5 0.7 - 3.1 x10E3/uL   Monocytes Absolute 0.5 0.1 - 0.9 x10E3/uL   EOS (ABSOLUTE) 0.2 0.0 - 0.4 x10E3/uL   Basophils Absolute 0.0 0.0 - 0.2 x10E3/uL   Immature Granulocytes 0 Not Estab. %   Immature Grans (Abs) 0.0 0.0 - 0.1 x10E3/uL  Comprehensive metabolic panel  Result Value Ref Range   Glucose 79 65 - 99 mg/dL   BUN 19 8 - 27 mg/dL   Creatinine, Ser 0.90 0.57 - 1.00 mg/dL   GFR calc non Af Amer 69 >59 mL/min/1.73   GFR calc Af Amer 80 >59 mL/min/1.73   BUN/Creatinine Ratio 21 12 - 28   Sodium 145 (H) 134 - 144  mmol/L   Potassium 4.2 3.5 - 5.2 mmol/L   Chloride 102 96 - 106 mmol/L   CO2 26 18 - 29 mmol/L   Calcium 9.7 8.7 - 10.3 mg/dL   Total Protein 7.0 6.0 - 8.5 g/dL   Albumin 4.9 (H) 3.6 - 4.8 g/dL   Globulin, Total 2.1 1.5 - 4.5 g/dL   Albumin/Globulin Ratio 2.3 (H) 1.2 - 2.2   Bilirubin Total 0.2 0.0 - 1.2 mg/dL   Alkaline Phosphatase 61 39 - 117 IU/L   AST 19 0 - 40 IU/L   ALT 18 0 - 32 IU/L  Lipid panel  Result Value Ref Range   Cholesterol, Total 223 (H) 100 - 199 mg/dL   Triglycerides 67 0 - 149 mg/dL   HDL 79 >39 mg/dL   VLDL Cholesterol Cal 13 5 - 40 mg/dL   LDL Calculated 131 (H) 0 - 99 mg/dL   Chol/HDL Ratio 2.8 0.0 - 4.4 ratio units  TSH  Result Value Ref Range   TSH 1.330 0.450 - 4.500 uIU/mL  Urinalysis, Routine w reflex microscopic (not at [facility])  Result Value Ref Range   Specific Gravity, UA 1.010 1.005 - 1.030   pH, UA 7.0 5.0 - 7.5   Color, UA Yellow Yellow   Appearance Ur Clear Clear   Leukocytes, UA 3+ (A) Negative   Protein, UA Negative Negative/Trace   Glucose, UA Negative Negative   Ketones, UA Negative Negative   RBC, UA Negative Negative   Bilirubin, UA Negative Negative   Urobilinogen, Ur 0.2 0.2 - 1.0 mg/dL   Nitrite, UA Negative Negative   Microscopic Examination See below:       Assessment & Plan:   Problem List Items Addressed This Visit      Other   Depression - Primary    The current medical regimen is effective;  continue present plan and medications.       Relevant Medications   lamoTRIgine (LAMICTAL) 200 MG tablet   OLANZapine (ZYPREXA) 2.5 MG tablet   FLUoxetine (PROZAC) 10 MG capsule   Anxiety    The current medical regimen is effective;  continue present plan and medications.       Relevant Medications   FLUoxetine (PROZAC) 10 MG capsule    Other Visit Diagnoses    Annual physical exam       Relevant Orders   CBC with Differential/Platelet   Comprehensive metabolic panel   Lipid panel   TSH   Urinalysis, Routine w  reflex microscopic   Breast cancer  screening       Relevant Orders   MM DIGITAL SCREENING BILATERAL   Encounter for screening for HIV       Relevant Orders   HIV antibody (with reflex)   Need for hepatitis C screening test       Relevant Orders   Hepatitis C Antibody       Follow up plan: Return in about 6 months (around 09/29/2017) for BMP.

## 2017-04-01 ENCOUNTER — Encounter: Payer: Self-pay | Admitting: Family Medicine

## 2017-04-01 LAB — HIV ANTIBODY (ROUTINE TESTING W REFLEX): HIV Screen 4th Generation wRfx: NONREACTIVE

## 2017-04-01 LAB — COMPREHENSIVE METABOLIC PANEL
ALT: 19 IU/L (ref 0–32)
AST: 21 IU/L (ref 0–40)
Albumin/Globulin Ratio: 1.9 (ref 1.2–2.2)
Albumin: 4.8 g/dL (ref 3.6–4.8)
Alkaline Phosphatase: 75 IU/L (ref 39–117)
BUN/Creatinine Ratio: 13 (ref 12–28)
BUN: 12 mg/dL (ref 8–27)
Bilirubin Total: 0.3 mg/dL (ref 0.0–1.2)
CO2: 27 mmol/L (ref 20–29)
Calcium: 10.3 mg/dL (ref 8.7–10.3)
Chloride: 98 mmol/L (ref 96–106)
Creatinine, Ser: 0.94 mg/dL (ref 0.57–1.00)
GFR calc Af Amer: 75 mL/min/{1.73_m2} (ref >59)
GFR calc non Af Amer: 65 mL/min/{1.73_m2} (ref >59)
Globulin, Total: 2.5 g/dL (ref 1.5–4.5)
Glucose: 82 mg/dL (ref 65–99)
Potassium: 4.2 mmol/L (ref 3.5–5.2)
Sodium: 141 mmol/L (ref 134–144)
Total Protein: 7.3 g/dL (ref 6.0–8.5)

## 2017-04-01 LAB — CBC WITH DIFFERENTIAL/PLATELET
Basophils Absolute: 0 10*3/uL (ref 0.0–0.2)
Basos: 0 %
EOS (ABSOLUTE): 0.1 10*3/uL (ref 0.0–0.4)
Eos: 1 %
Hematocrit: 44 % (ref 34.0–46.6)
Hemoglobin: 15 g/dL (ref 11.1–15.9)
Immature Grans (Abs): 0 10*3/uL (ref 0.0–0.1)
Immature Granulocytes: 0 %
Lymphocytes Absolute: 2.7 10*3/uL (ref 0.7–3.1)
Lymphs: 38 %
MCH: 30 pg (ref 26.6–33.0)
MCHC: 34.1 g/dL (ref 31.5–35.7)
MCV: 88 fL (ref 79–97)
Monocytes Absolute: 0.4 10*3/uL (ref 0.1–0.9)
Monocytes: 5 %
Neutrophils Absolute: 3.8 10*3/uL (ref 1.4–7.0)
Neutrophils: 56 %
Platelets: 399 10*3/uL — ABNORMAL HIGH (ref 150–379)
RBC: 5 x10E6/uL (ref 3.77–5.28)
RDW: 12.6 % (ref 12.3–15.4)
WBC: 7.1 10*3/uL (ref 3.4–10.8)

## 2017-04-01 LAB — LIPID PANEL
Chol/HDL Ratio: 2.7 ratio (ref 0.0–4.4)
Cholesterol, Total: 248 mg/dL — ABNORMAL HIGH (ref 100–199)
HDL: 93 mg/dL (ref >39)
LDL Calculated: 140 mg/dL — ABNORMAL HIGH (ref 0–99)
Triglycerides: 73 mg/dL (ref 0–149)
VLDL Cholesterol Cal: 15 mg/dL (ref 5–40)

## 2017-04-01 LAB — TSH: TSH: 1.7 u[IU]/mL (ref 0.450–4.500)

## 2017-04-01 LAB — HEPATITIS C ANTIBODY: Hep C Virus Ab: 0.1 s/co ratio (ref 0.0–0.9)

## 2017-07-29 ENCOUNTER — Encounter (INDEPENDENT_AMBULATORY_CARE_PROVIDER_SITE_OTHER): Payer: Self-pay | Admitting: Ophthalmology

## 2017-08-04 ENCOUNTER — Encounter (INDEPENDENT_AMBULATORY_CARE_PROVIDER_SITE_OTHER): Payer: PRIVATE HEALTH INSURANCE | Admitting: Ophthalmology

## 2017-08-04 DIAGNOSIS — H35372 Puckering of macula, left eye: Secondary | ICD-10-CM | POA: Diagnosis not present

## 2017-08-04 DIAGNOSIS — H2513 Age-related nuclear cataract, bilateral: Secondary | ICD-10-CM

## 2017-08-04 DIAGNOSIS — H43813 Vitreous degeneration, bilateral: Secondary | ICD-10-CM

## 2017-08-17 ENCOUNTER — Other Ambulatory Visit: Payer: Self-pay | Admitting: Family Medicine

## 2017-08-17 DIAGNOSIS — F3341 Major depressive disorder, recurrent, in partial remission: Secondary | ICD-10-CM

## 2017-08-18 NOTE — Telephone Encounter (Addendum)
Fluoxetine (Prozac) refill Last OV: 03/31/17 Last Refill:#90 4 RF 03/31/18 [facility]acy:[facility] [place] [address]  Dr. Jeananne Rama  Olanzapine refill Last OV: 03/31/17 Last Refill:#90 4 RF 03/31/17  Lamotrigine (Lamictal) refill Last OV: 03/31/17 Last Refill:#90 tab 4 RF 03/31/17

## 2017-08-20 ENCOUNTER — Other Ambulatory Visit: Payer: Self-pay | Admitting: Family Medicine

## 2017-08-20 DIAGNOSIS — F3341 Major depressive disorder, recurrent, in partial remission: Secondary | ICD-10-CM

## 2017-08-21 ENCOUNTER — Other Ambulatory Visit: Payer: Self-pay | Admitting: Family Medicine

## 2017-08-21 ENCOUNTER — Encounter: Payer: Self-pay | Admitting: Family Medicine

## 2017-08-21 ENCOUNTER — Telehealth: Payer: Self-pay | Admitting: Family Medicine

## 2017-08-21 DIAGNOSIS — F3341 Major depressive disorder, recurrent, in partial remission: Secondary | ICD-10-CM

## 2017-08-21 MED ORDER — FLUOXETINE HCL 10 MG PO CAPS: 10.0000 mg | ORAL_CAPSULE | Freq: Every day | ORAL | 1 refills | Status: DC

## 2017-08-21 MED ORDER — OLANZAPINE 2.5 MG PO TABS: 2.5000 mg | ORAL_TABLET | Freq: Every day | ORAL | 1 refills | Status: DC

## 2017-08-21 MED ORDER — LAMOTRIGINE 200 MG PO TABS: ORAL_TABLET | ORAL | 1 refills | Status: DC

## 2017-08-21 NOTE — Telephone Encounter (Signed)
Copied from [place] [id number]. Topic: Inquiry >> Aug 21, 2017  1:33 PM Pricilla Handler wrote: Reason for CRM: Patient called requesting refills for three medications. Patient is almost out of all three. Patient states that the pharmacy faxed over requests twice this week, but have not received a response. Patient needs:  FLUoxetine (PROZAC) 10 MG capsule, LamoTRIgine (LAMICTAL) 200 MG tablet, and OLANZapine (ZYPREXA) 2.5 MG tablet Refilled. Patient's preferred pharmacy is [id number] [address], [place] [phone] (Phone) [phone] (Fax).       Thank You!!!

## 2017-08-21 NOTE — Telephone Encounter (Signed)
Call placed to pt.  Left voice message to inquire if she wants to have the Rx's of Fluoxetine, Olanzapine, and Lamotrigine [facility] [address] [facility] [place], [facility] [place].  LMTCB.

## 2017-08-21 NOTE — Telephone Encounter (Signed)
Already filled in another encounter

## 2017-09-27 ENCOUNTER — Encounter: Payer: Self-pay | Admitting: Family Medicine

## 2017-10-01 ENCOUNTER — Ambulatory Visit: Payer: 59 | Admitting: Family Medicine

## 2017-11-18 ENCOUNTER — Ambulatory Visit: Payer: Self-pay | Admitting: *Deleted

## 2017-11-18 ENCOUNTER — Other Ambulatory Visit: Payer: Self-pay

## 2017-11-18 ENCOUNTER — Emergency Department: Payer: 59

## 2017-11-18 ENCOUNTER — Inpatient Hospital Stay
Admission: EM | Admit: 2017-11-18 | Discharge: 2017-11-22 | DRG: 419 | Disposition: A | Discharge status: Home / Self Care | Payer: 59 | Attending: Internal Medicine | Admitting: Internal Medicine

## 2017-11-18 DIAGNOSIS — R1011 Right upper quadrant pain: Secondary | ICD-10-CM

## 2017-11-18 DIAGNOSIS — K8050 Calculus of bile duct without cholangitis or cholecystitis without obstruction: Secondary | ICD-10-CM | POA: Diagnosis not present

## 2017-11-18 DIAGNOSIS — Z87891 Personal history of nicotine dependence: Secondary | ICD-10-CM | POA: Diagnosis not present

## 2017-11-18 DIAGNOSIS — M810 Age-related osteoporosis without current pathological fracture: Secondary | ICD-10-CM | POA: Diagnosis present

## 2017-11-18 DIAGNOSIS — K8051 Calculus of bile duct without cholangitis or cholecystitis with obstruction: Secondary | ICD-10-CM

## 2017-11-18 DIAGNOSIS — K8063 Calculus of gallbladder and bile duct with acute cholecystitis with obstruction: Principal | ICD-10-CM | POA: Diagnosis present

## 2017-11-18 DIAGNOSIS — F329 Major depressive disorder, single episode, unspecified: Secondary | ICD-10-CM | POA: Diagnosis present

## 2017-11-18 LAB — COMPREHENSIVE METABOLIC PANEL
ALT: 417 U/L — ABNORMAL HIGH (ref 0–44)
AST: 425 U/L — ABNORMAL HIGH (ref 15–41)
Albumin: 4.4 g/dL (ref 3.5–5.0)
Alkaline Phosphatase: 206 U/L — ABNORMAL HIGH (ref 38–126)
Anion gap: 9 (ref 5–15)
BUN: 12 mg/dL (ref 8–23)
CO2: 24 mmol/L (ref 22–32)
Calcium: 9.2 mg/dL (ref 8.9–10.3)
Chloride: 105 mmol/L (ref 98–111)
Creatinine, Ser: 0.69 mg/dL (ref 0.44–1.00)
GFR calc Af Amer: >60 mL/min (ref >60)
GFR calc non Af Amer: >60 mL/min (ref >60)
Glucose, Bld: 137 mg/dL — ABNORMAL HIGH (ref 70–99)
Potassium: 3.7 mmol/L (ref 3.5–5.1)
Sodium: 138 mmol/L (ref 135–145)
Total Bilirubin: 2 mg/dL — ABNORMAL HIGH (ref 0.3–1.2)
Total Protein: 7.2 g/dL (ref 6.5–8.1)

## 2017-11-18 LAB — URINALYSIS, COMPLETE (UACMP) WITH MICROSCOPIC
Bacteria, UA: NONE SEEN
Bilirubin Urine: NEGATIVE
Glucose, UA: NEGATIVE mg/dL
Hgb urine dipstick: NEGATIVE
Ketones, ur: NEGATIVE mg/dL
Leukocytes, UA: NEGATIVE
Nitrite: NEGATIVE
Protein, ur: NEGATIVE mg/dL
Specific Gravity, Urine: 1.001 — ABNORMAL LOW (ref 1.005–1.030)
Squamous Epithelial / LPF: NONE SEEN (ref 0–5)
WBC, UA: NONE SEEN WBC/hpf (ref 0–5)
pH: 7 (ref 5.0–8.0)

## 2017-11-18 LAB — CBC
HCT: 42.8 % (ref 35.0–47.0)
Hemoglobin: 15 g/dL (ref 12.0–16.0)
MCH: 31.2 pg (ref 26.0–34.0)
MCHC: 34.9 g/dL (ref 32.0–36.0)
MCV: 89.4 fL (ref 80.0–100.0)
Platelets: 276 10*3/uL (ref 150–440)
RBC: 4.79 MIL/uL (ref 3.80–5.20)
RDW: 12.7 % (ref 11.5–14.5)
WBC: 7 10*3/uL (ref 3.6–11.0)

## 2017-11-18 LAB — LIPASE, BLOOD: Lipase: 49 U/L (ref 11–51)

## 2017-11-18 MED ORDER — LAMOTRIGINE 100 MG PO TABS
200.0000 mg | ORAL_TABLET | Freq: Every day | ORAL | Status: DC
  Administered 2017-11-18 – 2017-11-22 (×5): 200 mg via ORAL
  Filled 2017-11-18 (×5): qty 2

## 2017-11-18 MED ORDER — MORPHINE SULFATE (PF) 4 MG/ML IV SOLN
4.0000 mg | INTRAVENOUS | Status: DC | PRN
  Administered 2017-11-18: 4 mg via INTRAVENOUS

## 2017-11-18 MED ORDER — ONDANSETRON HCL 4 MG/2ML IJ SOLN
4.0000 mg | Freq: Four times a day (QID) | INTRAMUSCULAR | Status: DC | PRN
  Administered 2017-11-19: 4 mg via INTRAVENOUS
  Filled 2017-11-18: qty 2

## 2017-11-18 MED ORDER — POLYETHYLENE GLYCOL 3350 17 G PO PACK: 17.0000 g | PACK | Freq: Every day | ORAL | Status: DC | PRN

## 2017-11-18 MED ORDER — ACETAMINOPHEN 325 MG PO TABS
650.0000 mg | ORAL_TABLET | Freq: Four times a day (QID) | ORAL | Status: DC | PRN
  Administered 2017-11-19 – 2017-11-21 (×4): 650 mg via ORAL
  Filled 2017-11-18 (×4): qty 2

## 2017-11-18 MED ORDER — SODIUM CHLORIDE 0.9 % IV BOLUS
1000.0000 mL | Freq: Once | INTRAVENOUS | Status: AC
  Administered 2017-11-18: 1000 mL via INTRAVENOUS

## 2017-11-18 MED ORDER — OLANZAPINE 2.5 MG PO TABS
2.5000 mg | ORAL_TABLET | Freq: Every day | ORAL | Status: DC
  Administered 2017-11-18 – 2017-11-21 (×4): 2.5 mg via ORAL
  Filled 2017-11-18 (×5): qty 1

## 2017-11-18 MED ORDER — SODIUM CHLORIDE 0.9 % IV SOLN
INTRAVENOUS | Status: DC
  Administered 2017-11-18 – 2017-11-19 (×2): via INTRAVENOUS

## 2017-11-18 MED ORDER — OXYCODONE HCL 5 MG PO TABS
5.0000 mg | ORAL_TABLET | ORAL | Status: DC | PRN
  Administered 2017-11-19 (×2): 5 mg via ORAL
  Filled 2017-11-18 (×2): qty 1

## 2017-11-18 MED ORDER — FLUOXETINE HCL 10 MG PO CAPS
10.0000 mg | ORAL_CAPSULE | Freq: Every day | ORAL | Status: DC
  Administered 2017-11-18 – 2017-11-22 (×5): 10 mg via ORAL
  Filled 2017-11-18 (×5): qty 1

## 2017-11-18 MED ORDER — MORPHINE SULFATE (PF) 4 MG/ML IV SOLN
4.0000 mg | INTRAVENOUS | Status: DC | PRN
  Filled 2017-11-18: qty 1

## 2017-11-18 MED ORDER — ALBUTEROL SULFATE (2.5 MG/3ML) 0.083% IN NEBU: 2.5000 mg | INHALATION_SOLUTION | RESPIRATORY_TRACT | Status: DC | PRN

## 2017-11-18 MED ORDER — ONDANSETRON HCL 4 MG PO TABS: 4.0000 mg | ORAL_TABLET | Freq: Four times a day (QID) | ORAL | Status: DC | PRN

## 2017-11-18 MED ORDER — ONDANSETRON HCL 4 MG/2ML IJ SOLN
4.0000 mg | Freq: Once | INTRAMUSCULAR | Status: AC
  Administered 2017-11-18: 4 mg via INTRAVENOUS
  Filled 2017-11-18: qty 2

## 2017-11-18 MED ORDER — ENOXAPARIN SODIUM 40 MG/0.4ML ~~LOC~~ SOLN
40.0000 mg | SUBCUTANEOUS | Status: DC
  Administered 2017-11-18 – 2017-11-20 (×3): 40 mg via SUBCUTANEOUS
  Filled 2017-11-18 (×4): qty 0.4

## 2017-11-18 MED ORDER — ACETAMINOPHEN 650 MG RE SUPP: 650.0000 mg | Freq: Four times a day (QID) | RECTAL | Status: DC | PRN

## 2017-11-18 NOTE — Consult Note (Signed)
Jonathon Bellows , MD [address] [place], [place], [place], [address] [address], [place], [place], [place], [address] Phone: [phone]  Fax: [phone]  Consultation  Referring Provider:  ER  Primary Care Physician:  Guadalupe Maple, MD Primary Gastroenterologist:  None          Reason for Consultation:     Joaquim Lai in Common bile duct  Date of Admission:  11/18/2017 Date of Consultation:  11/18/2017         HPI:   ELSI STELZER is a 62 y.o. female presented to the ER 2 hours back with RUQ pain .  She states that she developed epigastric pain on last Thursday which persisted for 1 day and subsequently resolved.  She had recurrence of severe pain of similar nature which developed last night which was very intense and brought her to the hospital.  She denies any nausea vomiting at this time.  Presently the pain feels less intense.  Denies any fever.  No similar issue in the past.  No family history of gallbladder issues.  Presently no other complaints.   RUQ USG performed shows multiple gall stones , atleast 1 stone in the CBD seen with intrahepatic ductal dilation. CBD 9.3 mm .    Hepatic Function Latest Ref Rng & Units 11/18/2017 03/31/2017 01/30/2016  Total Protein 6.5 - 8.1 g/dL 7.2 7.3 7.0  Albumin 3.5 - 5.0 g/dL 4.4 4.8 4.9(H)  AST 15 - 41 U/L 425(H) 21 19  ALT 0 - 44 U/L 417(H) 19 18  Alk Phosphatase 38 - 126 U/L 206(H) 75 61  Total Bilirubin 0.3 - 1.2 mg/dL 2.0(H) 0.3 0.2     Past Medical History:  Diagnosis Date  . Depression   . Osteoporosis     Past Surgical History:  Procedure Laterality Date  . ABDOMINAL HYSTERECTOMY    . HEMORRHOID SURGERY      Prior to Admission medications   Medication Sig Start Date End Date Taking? Authorizing Provider  FLUoxetine (PROZAC) 10 MG capsule Take 1 capsule (10 mg total) by mouth daily. 08/21/17  Yes Guadalupe Maple, MD  lamoTRIgine (LAMICTAL) 200 MG tablet Take 1 tablet by mouth  daily 08/21/17  Yes Crissman, Jeannette How, MD  OLANZapine (ZYPREXA) 2.5 MG tablet Take 1 tablet (2.5 mg total) by mouth at bedtime. 08/21/17  Yes Guadalupe Maple, MD    Family History  Problem Relation Age of Onset  . Osteoporosis Mother   . Graves' disease Mother   . Osteoporosis Father   . Hernia Father   . Heart disease Father   . Kidney Stones Father   . Hypotension Father      Social History   Tobacco Use  . Smoking status: Former Research scientist (life sciences)  . Smokeless tobacco: Never Used  Substance Use Topics  . Alcohol use: Yes    Alcohol/week: 0.6 oz    Types: 1 Cans of beer per week  . Drug use: No    Allergies as of 11/18/2017 - Review Complete 11/18/2017  Allergen Reaction Noted  . Sulfa antibiotics  01/17/2015    Review of Systems:    All systems reviewed and negative except where noted in HPI.   Physical Exam:  Vital signs in last 24 hours: Temp:  [98.6 F (37 C)] 98.6 F (37 C) (07/31 0944) Pulse Rate:  [72-79] 72 (07/31 1100) Resp:  [17] 17 (07/31 0944) BP: (114-142)/(74-80) 114/80 (07/31 1100) SpO2:  [96 %] 96 % (07/31 1100)  Weight:  [126 lb (57.2 kg)] 126 lb (57.2 kg) (07/31 0945)   General:   Pleasant, cooperative in NAD Head:  Normocephalic and atraumatic. Eyes:   No icterus.   Conjunctiva pink. PERRLA. Ears:  Normal auditory acuity. Neck:  Supple; no masses or thyroidomegaly Lungs: Respirations even and unlabored. Lungs clear to auscultation bilaterally.   No wheezes, crackles, or rhonchi.  Heart:  Regular rate and rhythm;  Without murmur, clicks, rubs or gallops Abdomen:  Soft, nondistended, mild epigastric tenderness . Normal bowel sounds. No appreciable masses or hepatomegaly.  No rebound or guarding.  Neurologic:  Alert and oriented x3;  grossly normal neurologically. Skin:  Intact without significant lesions or rashes. Cervical Nodes:  No significant cervical adenopathy. Psych:  Alert and cooperative. Normal affect.  LAB RESULTS: Recent Labs    11/18/17 1001  WBC 7.0  HGB 15.0  HCT 42.8   PLT 276   BMET Recent Labs    11/18/17 1001  NA 138  K 3.7  CL 105  CO2 24  GLUCOSE 137*  BUN 12  CREATININE 0.69  CALCIUM 9.2   LFT Recent Labs    11/18/17 1001  PROT 7.2  ALBUMIN 4.4  AST 425*  ALT 417*  ALKPHOS 206*  BILITOT 2.0*   PT/INR No results for input(s): LABPROT, INR in the last 72 hours.  STUDIES: US Abdomen Limited Ruq  Result Date: 11/18/2017 CLINICAL DATA:  Right upper quadrant pain for the past 6 days. EXAM: ULTRASOUND ABDOMEN LIMITED RIGHT UPPER QUADRANT COMPARISON:  None in PACs FINDINGS: Gallbladder: The gallbladder is adequately distended. There are multiple echogenic mobile shadowing stones with the largest measuring 1.7 cm in diameter. There is no gallbladder wall thickening, pericholecystic fluid, or positive sonographic Murphy's sign. Common bile duct: Diameter: 9.3 cm. There are intraluminal shadowing stones present measuring up to 1 cm in diameter. Liver: No focal lesion identified. Within normal limits in parenchymal echogenicity. There is intrahepatic ductal dilation. Portal vein is patent on color Doppler imaging with normal direction of blood flow towards the liver. IMPRESSION: Multiple gallstones without sonographic evidence of acute cholecystitis. There is at least 1 common bile duct stone measuring 1 cm in diameter. There is intrahepatic ductal dilation. The hepatic parenchyma appears normal. Electronically Signed   By: David  Martinique M.D.   On: 11/18/2017 12:13      Impression / Plan:   JANIT CUTTER is a 62 y.o. y/o female  Presents to the ER with biliary colic, choledocholithiasis . No evidence of cholangitis.    Plan  1. Watch for fever, if she does get fever then will need antibiotics for cholangitis  And urgent ERCP,otherwise Dr Allen Norris swill  do ERCP on Friday .   2. She will need a Cholecystectomy after ERCP- suggest to consult surgery and they can determine if they would like to do it before discharge.   I have discussed  alternative options, risks & benefits,  which include, but are not limited to, bleeding, infection,acuet pancreatitis,  perforation,respiratory complication & drug reaction.  The patient agrees with this plan & written consent will be obtained.     Thank you for involving me in the care of this patient.      LOS: 0 days   Jonathon Bellows, MD  11/18/2017, 1:27 PM

## 2017-11-18 NOTE — Consult Note (Signed)
Patient ID: Rhonda Reeves, female   DOB: 1956/01/20, 62 y.o.   MRN: [id number]  HPI Rhonda Reeves is a 62 y.o. female asked to see in consultation by Dr. Darvin Neighbours.  Came into the emergency room today complaining of epigastric abdominal pain.  The pain she stated started about 4 to 5 days ago and it was intermittent.  Yesterday the pain was really severe and doubled her over.  She reports that the pain was about 10 out of 10 sharp in nature.  There was no specific alleviating or aggravating factors.  She did develop some decreased appetite but no emesis no evidence of cholangitis no evidence of biliary obstruction.  She did have an sounds that I have personally reviewed showing evidence of dilation of the common bile duct with a 1 cm stone within the common bile duct.  She also had cholelithiasis.  PMP revealed an increase in the alkaline phosphatase bilirubin of 2 and increasing AST and ALT.  Her white count was normal and not she is pain-free. she is able to perform more than 4 METS of activity without any shortness of air or chest pain. Abdominal surgery was abdominal hysterectomy several years ago.  HPI  Past Medical History:  Diagnosis Date  . Depression   . Osteoporosis     Past Surgical History:  Procedure Laterality Date  . ABDOMINAL HYSTERECTOMY    . HEMORRHOID SURGERY      Family History  Problem Relation Age of Onset  . Osteoporosis Mother   . Graves' disease Mother   . Osteoporosis Father   . Hernia Father   . Heart disease Father   . Kidney Stones Father   . Hypotension Father     Social History Social History   Tobacco Use  . Smoking status: Former Research scientist (life sciences)  . Smokeless tobacco: Never Used  Substance Use Topics  . Alcohol use: Yes    Alcohol/week: 0.6 oz    Types: 1 Cans of beer per week  . Drug use: No    Allergies  Allergen Reactions  . Sulfa Antibiotics     Current Facility-Administered Medications  Medication Dose Route Frequency Provider Last Rate  Last Dose  . 0.9 %  sodium chloride infusion   Intravenous Continuous Hillary Bow, MD 100 mL/hr at 11/18/17 1542    . acetaminophen (TYLENOL) tablet 650 mg  650 mg Oral Q6H PRN Hillary Bow, MD       Or  . acetaminophen (TYLENOL) suppository 650 mg  650 mg Rectal Q6H PRN Sudini, Srikar, MD      . albuterol (PROVENTIL) (2.5 MG/3ML) 0.083% nebulizer solution 2.5 mg  2.5 mg Nebulization Q2H PRN Sudini, Srikar, MD      . enoxaparin (LOVENOX) injection 40 mg  40 mg Subcutaneous Q24H Sudini, Srikar, MD      . FLUoxetine (PROZAC) capsule 10 mg  10 mg Oral Daily Hillary Bow, MD   10 mg at 11/18/17 1740  . lamoTRIgine (LAMICTAL) tablet 200 mg  200 mg Oral Daily Hillary Bow, MD   200 mg at 11/18/17 1740  . morphine 4 MG/ML injection 4 mg  4 mg Intravenous Q3H PRN Hillary Bow, MD   4 mg at 11/18/17 1406  . OLANZapine (ZYPREXA) tablet 2.5 mg  2.5 mg Oral QHS Sudini, Srikar, MD      . ondansetron (ZOFRAN) tablet 4 mg  4 mg Oral Q6H PRN Hillary Bow, MD       Or  . ondansetron [facility]) injection 4  mg  4 mg Intravenous Q6H PRN Sudini, Alveta Heimlich, MD      . oxyCODONE (Oxy IR/ROXICODONE) immediate release tablet 5 mg  5 mg Oral Q4H PRN Sudini, Srikar, MD      . polyethylene glycol (MIRALAX / GLYCOLAX) packet 17 g  17 g Oral Daily PRN Hillary Bow, MD         Review of Systems Full ROS  was asked and was negative except for the information on the HPI  Physical Exam Blood pressure 100/68, pulse 72, temperature 97.9 F (36.6 C), temperature source Oral, resp. rate 16, height 5\' 2"  (1.575 m), weight 126 lb (57.2 kg), SpO2 95 %. CONSTITUTIONAL: NAD EYES: Pupils are equal, round, and reactive to light, Sclera are non-icteric. EARS, NOSE, MOUTH AND THROAT: The oropharynx is clear. The oral mucosa is pink and moist. Hearing is intact to voice. LYMPH NODES:  Lymph nodes in the neck are normal. RESPIRATORY:  Lungs are clear. There is normal respiratory effort, with equal breath sounds bilaterally,  and without pathologic use of accessory muscles. CARDIOVASCULAR: Heart is regular without murmurs, gallops, or rubs. GI: The abdomen is  soft, nontender, and nondistended. There are no palpable masses. There is no hepatosplenomegaly. There are normal bowel sounds in all quadrants. GU: Rectal deferred.   MUSCULOSKELETAL: Normal muscle strength and tone. No cyanosis or edema.   SKIN: Turgor is good and there are no pathologic skin lesions or ulcers. NEUROLOGIC: Motor and sensation is grossly normal. Cranial nerves are grossly intact. PSYCH:  Oriented to person, place and time. Affect is normal.  Data Reviewed  I have personally reviewed the patient's imaging, laboratory findings and medical records.    Assessment/Plan 62 year old female with choledocholithiasis evident on ultrasound and increase in the LFTs and bilirubin.    Currently patient without any evidence of sepsis or cholangitis.  GI has recommended an ERCP which I agree with.  We will be happy to remove the gallbladder after she completes the ERCP.  We will discuss with Dr. Burt Knack since he is the surgeon on-call over the weekend and asked him if he is willing to perform this.  In the meantime we will continue to follow her. The risks, benefits, complications, treatment options, and expected outcomes were discussed with the patient. The possibilities of bleeding, recurrent infection, finding a normal gallbladder, perforation of viscus organs, damage to surrounding structures, bile leak, abscess formation, needing a drain placed, the need for additional procedures, reaction to medication, pulmonary aspiration,  failure to diagnose a condition, the possible need to convert to an open procedure, and creating a complication requiring transfusion or operation were discussed with the patient. The patient and/or family concurred with the proposed plan. We tentatively will plan for lap chole on Saturday or Sunday.  No need for emergent surgical  intervention at this time  Caroleen Hamman, MD [facility] 11/18/2017, 8:59 PM

## 2017-11-18 NOTE — ED Triage Notes (Signed)
Pt c/o having intermittent sharp upper abd pain since Thursday, states in the last 2 night the pain has worsened. Denies N/V/D/fever. The pain nonradiating.

## 2017-11-18 NOTE — H&P (Signed)
[facility] NAME: Rhonda Reeves    MR#:  [id number]  DATE OF BIRTH:  1955/06/06  DATE OF ADMISSION:  11/18/2017  PRIMARY CARE PHYSICIAN: Guadalupe Maple, MD   REQUESTING/REFERRING PHYSICIAN: Dr. Quentin Cornwall  CHIEF COMPLAINT:   Chief Complaint  Patient presents with  . Abdominal Pain    HISTORY OF PRESENT ILLNESS:  Rhonda Reeves  is a 62 y.o. female with a known history of osteoporosis, depression presents to the hospital complaining of 4 days of intermittent abdominal pain which is acutely worsened today.  Patient has right upper quadrant and epigastric area abdominal pain related to food but also at times when she had this while sleeping.  Today her liver function test along with bilirubin have been found to be elevated.  Afebrile.  Gallstones on ultrasound with common bile duct 1 cm stone.  Patient is afebrile.  Case discussed with GI Dr. Allen Norris from emergency room.  She is being admitted for ERCP.  PAST MEDICAL HISTORY:   Past Medical History:  Diagnosis Date  . Depression   . Osteoporosis     PAST SURGICAL HISTORY:   Past Surgical History:  Procedure Laterality Date  . ABDOMINAL HYSTERECTOMY    . HEMORRHOID SURGERY      SOCIAL HISTORY:   Social History   Tobacco Use  . Smoking status: Former Research scientist (life sciences)  . Smokeless tobacco: Never Used  Substance Use Topics  . Alcohol use: Yes    Alcohol/week: 0.6 oz    Types: 1 Cans of beer per week    FAMILY HISTORY:   Family History  Problem Relation Age of Onset  . Osteoporosis Mother   . Graves' disease Mother   . Osteoporosis Father   . Hernia Father   . Heart disease Father   . Kidney Stones Father   . Hypotension Father     DRUG ALLERGIES:   Allergies  Allergen Reactions  . Sulfa Antibiotics     REVIEW OF SYSTEMS:   Review of Systems  Constitutional: Positive for malaise/fatigue. Negative for chills, fever and weight loss.  HENT: Negative for hearing loss  and nosebleeds.   Eyes: Negative for blurred vision, double vision and pain.  Respiratory: Negative for cough, hemoptysis, sputum production, shortness of breath and wheezing.   Cardiovascular: Negative for chest pain, palpitations, orthopnea and leg swelling.  Gastrointestinal: Positive for abdominal pain and nausea. Negative for constipation, diarrhea and vomiting.  Genitourinary: Negative for dysuria and hematuria.  Musculoskeletal: Negative for back pain, falls and myalgias.  Skin: Negative for rash.  Neurological: Negative for dizziness, tremors, sensory change, speech change, focal weakness, seizures and headaches.  Endo/Heme/Allergies: Does not bruise/bleed easily.  Psychiatric/Behavioral: Negative for depression and memory loss. The patient is not nervous/anxious.     MEDICATIONS AT HOME:   Prior to Admission medications   Medication Sig Start Date End Date Taking? Authorizing Provider  FLUoxetine (PROZAC) 10 MG capsule Take 1 capsule (10 mg total) by mouth daily. 08/21/17  Yes Guadalupe Maple, MD  lamoTRIgine (LAMICTAL) 200 MG tablet Take 1 tablet by mouth  daily 08/21/17  Yes Crissman, Jeannette How, MD  OLANZapine (ZYPREXA) 2.5 MG tablet Take 1 tablet (2.5 mg total) by mouth at bedtime. 08/21/17  Yes Crissman, Jeannette How, MD     VITAL SIGNS:  Blood pressure 114/80, pulse 72, temperature 98.6 F (37 C), temperature source Oral, resp. rate 17, height 5\' 2"  (1.575 m), weight 57.2 kg (126 lb), SpO2  96 %.  PHYSICAL EXAMINATION:  Physical Exam  GENERAL:  62 y.o.-year-old patient lying in the bed with no acute distress.  EYES: Pupils equal, round, reactive to light and accommodation. No scleral icterus. Extraocular muscles intact.  HEENT: Head atraumatic, normocephalic. Oropharynx and nasopharynx clear. No oropharyngeal erythema, moist oral mucosa  NECK:  Supple, no jugular venous distention. No thyroid enlargement, no tenderness.  LUNGS: Normal breath sounds bilaterally, no wheezing, rales,  rhonchi. No use of accessory muscles of respiration.  CARDIOVASCULAR: S1, S2 normal. No murmurs, rubs, or gallops.  ABDOMEN: Soft, right upper quadrant and epigastric area tenderness, nondistended. Bowel sounds present. No organomegaly or mass.  EXTREMITIES: No pedal edema, cyanosis, or clubbing. + 2 pedal & radial pulses b/l.   NEUROLOGIC: Cranial nerves II through XII are intact. No focal Motor or sensory deficits appreciated b/l PSYCHIATRIC: The patient is alert and oriented x 3. Good affect.  SKIN: No obvious rash, lesion, or ulcer.   LABORATORY PANEL:   CBC Recent Labs  Lab 11/18/17 1001  WBC 7.0  HGB 15.0  HCT 42.8  PLT 276   ------------------------------------------------------------------------------------------------------------------  Chemistries  Recent Labs  Lab 11/18/17 1001  NA 138  K 3.7  CL 105  CO2 24  GLUCOSE 137*  BUN 12  CREATININE 0.69  CALCIUM 9.2  AST 425*  ALT 417*  ALKPHOS 206*  BILITOT 2.0*   ------------------------------------------------------------------------------------------------------------------  Cardiac Enzymes No results for input(s): TROPONINI in the last 168 hours. ------------------------------------------------------------------------------------------------------------------  RADIOLOGY:  US Abdomen Limited Ruq  Result Date: 11/18/2017 CLINICAL DATA:  Right upper quadrant pain for the past 6 days. EXAM: ULTRASOUND ABDOMEN LIMITED RIGHT UPPER QUADRANT COMPARISON:  None in PACs FINDINGS: Gallbladder: The gallbladder is adequately distended. There are multiple echogenic mobile shadowing stones with the largest measuring 1.7 cm in diameter. There is no gallbladder wall thickening, pericholecystic fluid, or positive sonographic Murphy's sign. Common bile duct: Diameter: 9.3 cm. There are intraluminal shadowing stones present measuring up to 1 cm in diameter. Liver: No focal lesion identified. Within normal limits in parenchymal  echogenicity. There is intrahepatic ductal dilation. Portal vein is patent on color Doppler imaging with normal direction of blood flow towards the liver. IMPRESSION: Multiple gallstones without sonographic evidence of acute cholecystitis. There is at least 1 common bile duct stone measuring 1 cm in diameter. There is intrahepatic ductal dilation. The hepatic parenchyma appears normal. Electronically Signed   By: David  Martinique M.D.   On: 11/18/2017 12:13     IMPRESSION AND PLAN:   *Choledocholithiasis with multiple gallstones in the gallbladder.  Case discussed with GI.  Patient will have ERCP tomorrow.  Pain medications added as needed.  We will put her on regular diet.  N.p.o. after midnight.  No need for IV antibiotics.  Would start Zosyn if she has any fever.  We will also consult surgery for cholecystectomy  *Transaminitis due to CBD stone.  Repeat labs in the morning.  *Depression.  Continue home medications.  *DVT prophylaxis with Lovenox  All the records are reviewed and case discussed with ED provider. Management plans discussed with the patient, family and they are in agreement.  CODE STATUS: Full code  TOTAL TIME TAKING CARE OF THIS PATIENT: 40 minutes.   Leia Alf Nicosha Struve M.D on 11/18/2017 at 1:55 PM  Between 7am to 6pm - Pager - [phone]  After 6pm go to [email or web address] - password [email or web address] [facility]  Office  [phone]  CC: Primary care physician; Guadalupe Maple,  MD  Note: This dictation was prepared with Dragon dictation along with smaller phrase technology. Any transcriptional errors that result from this process are unintentional.

## 2017-11-18 NOTE — ED Provider Notes (Signed)
[facility] Emergency Department Provider Note    First MD Initiated Contact with Patient 11/18/17 1050     (approximate)  I have reviewed the triage vital signs and the nursing notes.   HISTORY  Chief Complaint Abdominal Pain    HPI Rhonda Reeves is a 62 y.o. female presents the ER chief complaint of intermittent right upper quadrant abdominal pain for the past several nights.  This is worsened after eating.  Describes it is crampy and aching pain.  Denies any fevers.  Does feel nauseated.  No vomiting.  Not on any blood thinners.  Denies any flank pain diarrhea or dysuria.    Past Medical History:  Diagnosis Date  . Depression   . Osteoporosis    Family History  Problem Relation Age of Onset  . Osteoporosis Mother   . Graves' disease Mother   . Osteoporosis Father   . Hernia Father   . Heart disease Father   . Kidney Stones Father   . Hypotension Father    Past Surgical History:  Procedure Laterality Date  . ABDOMINAL HYSTERECTOMY    . HEMORRHOID SURGERY     Patient Active Problem List   Diagnosis Date Noted  . Anxiety 02/19/2017  . Depression 01/18/2015      Prior to Admission medications   Medication Sig Start Date End Date Taking? Authorizing Provider  FLUoxetine (PROZAC) 10 MG capsule Take 1 capsule (10 mg total) by mouth daily. 08/21/17  Yes Guadalupe Maple, MD  lamoTRIgine (LAMICTAL) 200 MG tablet Take 1 tablet by mouth  daily 08/21/17  Yes Crissman, Jeannette How, MD  OLANZapine (ZYPREXA) 2.5 MG tablet Take 1 tablet (2.5 mg total) by mouth at bedtime. 08/21/17  Yes Guadalupe Maple, MD    Allergies Sulfa antibiotics    Social History Social History   Tobacco Use  . Smoking status: Former Research scientist (life sciences)  . Smokeless tobacco: Never Used  Substance Use Topics  . Alcohol use: Yes    Alcohol/week: 0.6 oz    Types: 1 Cans of beer per week  . Drug use: No    Review of Systems Patient denies headaches, rhinorrhea, blurry vision,  numbness, shortness of breath, chest pain, edema, cough, abdominal pain, nausea, vomiting, diarrhea, dysuria, fevers, rashes or hallucinations unless otherwise stated above in HPI. ____________________________________________   PHYSICAL EXAM:  VITAL SIGNS: Vitals:   11/18/17 0944 11/18/17 1100  BP: (!) 142/74 114/80  Pulse: 79 72  Resp: 17   Temp: 98.6 F (37 C)   SpO2: 96% 96%    Constitutional: Alert and oriented.  Eyes: Conjunctivae are normal.  Head: Atraumatic. Nose: No congestion/rhinnorhea. Mouth/Throat: Mucous membranes are moist.   Neck: No stridor. Painless ROM.  Cardiovascular: Normal rate, regular rhythm. Grossly normal heart sounds.  Good peripheral circulation. Respiratory: Normal respiratory effort.  No retractions. Lungs CTAB. Gastrointestinal: Soft and nontender. No distention. No abdominal bruits. No CVA tenderness. Genitourinary:  Musculoskeletal: No lower extremity tenderness nor edema.  No joint effusions. Neurologic:  Normal speech and language. No gross focal neurologic deficits are appreciated. No facial droop Skin:  Skin is warm, dry and intact. No rash noted. Psychiatric: Mood and affect are normal. Speech and behavior are normal.  ____________________________________________   LABS (all labs ordered are listed, but only abnormal results are displayed)  Results for orders placed or performed during the hospital encounter of 11/18/17 (from the past 24 hour(s))  Lipase, blood     Status: None   Collection  Time: 11/18/17 10:01 AM  Result Value Ref Range   Lipase 49 11 - 51 U/L  Comprehensive metabolic panel     Status: Abnormal   Collection Time: 11/18/17 10:01 AM  Result Value Ref Range   Sodium 138 135 - 145 mmol/L   Potassium 3.7 3.5 - 5.1 mmol/L   Chloride 105 98 - 111 mmol/L   CO2 24 22 - 32 mmol/L   Glucose, Bld 137 (H) 70 - 99 mg/dL   BUN 12 8 - 23 mg/dL   Creatinine, Ser 0.69 0.44 - 1.00 mg/dL   Calcium 9.2 8.9 - 10.3 mg/dL   Total  Protein 7.2 6.5 - 8.1 g/dL   Albumin 4.4 3.5 - 5.0 g/dL   AST 425 (H) 15 - 41 U/L   ALT 417 (H) 0 - 44 U/L   Alkaline Phosphatase 206 (H) 38 - 126 U/L   Total Bilirubin 2.0 (H) 0.3 - 1.2 mg/dL   GFR calc non Af Amer >60 >60 mL/min   GFR calc Af Amer >60 >60 mL/min   Anion gap 9 5 - 15  CBC     Status: None   Collection Time: 11/18/17 10:01 AM  Result Value Ref Range   WBC 7.0 3.6 - 11.0 K/uL   RBC 4.79 3.80 - 5.20 MIL/uL   Hemoglobin 15.0 12.0 - 16.0 g/dL   HCT 42.8 35.0 - 47.0 %   MCV 89.4 80.0 - 100.0 fL   MCH 31.2 26.0 - 34.0 pg   MCHC 34.9 32.0 - 36.0 g/dL   RDW 12.7 11.5 - 14.5 %   Platelets 276 150 - 440 K/uL  Urinalysis, Complete w Microscopic     Status: Abnormal   Collection Time: 11/18/17 10:01 AM  Result Value Ref Range   Color, Urine YELLOW (A) YELLOW   APPearance CLEAR (A) CLEAR   Specific Gravity, Urine 1.001 (L) 1.005 - 1.030   pH 7.0 5.0 - 8.0   Glucose, UA NEGATIVE NEGATIVE mg/dL   Hgb urine dipstick NEGATIVE NEGATIVE   Bilirubin Urine NEGATIVE NEGATIVE   Ketones, ur NEGATIVE NEGATIVE mg/dL   Protein, ur NEGATIVE NEGATIVE mg/dL   Nitrite NEGATIVE NEGATIVE   Leukocytes, UA NEGATIVE NEGATIVE   RBC / HPF 0-5 0 - 5 RBC/hpf   WBC, UA NONE SEEN 0 - 5 WBC/hpf   Bacteria, UA NONE SEEN NONE SEEN   Squamous Epithelial / LPF NONE SEEN 0 - 5   ____________________________________________ ____________________________________________  RADIOLOGY  I personally reviewed all radiographic images ordered to evaluate for the above acute complaints and reviewed radiology reports and findings.  These findings were personally discussed with the patient.  Please see medical record for radiology report.  ____________________________________________   PROCEDURES  Procedure(s) performed:  Procedures    Critical Care performed: no ____________________________________________   INITIAL IMPRESSION / ASSESSMENT AND PLAN / ED COURSE  Pertinent labs & imaging results  that were available during my care of the patient were reviewed by me and considered in my medical decision making (see chart for details).   DDX: Cholelithiasis, cholecystitis, pancreatitis, hepatitis, enteritis  Rhonda Reeves is a 62 y.o. who presents to the ED with symptoms as described above.  Based on her transaminitis and elevated bilirubin will order ultrasound as I am concerned for cholelithiasis.  She is afebrile and otherwise non toxic at this time.  The patient will be placed on continuous pulse oximetry and telemetry for monitoring.  Laboratory evaluation will be sent to evaluate for the  above complaints.     Clinical Course as of Nov 19 1318  Wed Nov 18, 2017  1232 Ultrasound does show evidence of CBD stone with intrahepatic dilatation which explain the patient's presentation.  We will touch base with GI.   [PR]  1317 Discussed case with Dr. Verl Blalock who agrees to see patient for ERCP.  She does not have any evidence of cholangitis or infective process at this time.  Patient will be admitted to hospital for IV fluids as well as IV pain medication.   [PR]    Clinical Course User Index [PR] Merlyn Lot, MD     As part of my medical decision making, I reviewed the following data within the [facility] notes reviewed and incorporated, Labs reviewed, notes from prior ED visits and [place] Controlled Substance Database   ____________________________________________   FINAL CLINICAL IMPRESSION(S) / ED DIAGNOSES  Final diagnoses:  RUQ pain  Calculus of bile duct without cholecystitis with obstruction      NEW MEDICATIONS STARTED DURING THIS VISIT:  New Prescriptions   No medications on file     Note:  This document was prepared using Dragon voice recognition software and may include unintentional dictation errors.    Merlyn Lot, MD 11/18/17 1320

## 2017-11-18 NOTE — Telephone Encounter (Signed)
Pt called with abdominal pain above the belly button; the pain has been intermittent since Thursday 11/12/17; she also states that she spoke with a nurse on 11/18/17 and was instructed to go to ED (spoke with nurse Amy); recommendations made per nurse triage protocol to include going to the ED; she refuses and would like to be seen in the office; her call back number is [phone], and she states that it is "ok to leave a message"; spoke with [place] at [place] regarding this issue.  Reason for Disposition . [1] SEVERE pain (e.g., excruciating) AND [2] present > 1 hour  Answer Assessment - Initial Assessment Questions 1. LOCATION: "Where does it hurt?"      Above bellow button on both sides 2. RADIATION: "Does the pain shoot anywhere else?" (e.g., chest, back)    Starts in middle of rib cage and goes out to both sides 3. ONSET: "When did the pain begin?" (e.g., minutes, hours or days ago)      11/12/17 4. SUDDEN: "Gradual or sudden onset?"     gradual 5. PATTERN "Does the pain come and go, or is it constant?"    - If constant: "Is it getting better, staying the same, or worsening?"      (Note: Constant means the pain never goes away completely; most serious pain is constant and it progresses)     - If intermittent: "How long does it last?" "Do you have pain now?"     (Note: Intermittent means the pain goes away completely between bouts)     intermittent 6. SEVERITY: "How bad is the pain?"  (e.g., Scale 1-10; mild, moderate, or severe)   - MILD (1-3): doesn't interfere with normal activities, abdomen soft and not tender to touch    - MODERATE (4-7): interferes with normal activities or awakens from sleep, tender to touch    - SEVERE (8-10): excruciating pain, doubled over, unable to do any normal activities      excruciating 7. RECURRENT SYMPTOM: "Have you ever had this type of abdominal pain before?" If so, ask: "When was the last time?" and "What happened that time?"      no 8. CAUSE: "What  do you think is causing the abdominal pain?"     unsure 9. RELIEVING/AGGRAVATING FACTORS: "What makes it better or worse?" (e.g., movement, antacids, bowel movement)     Laying on right side makes it worse; movement makes it worse 10. OTHER SYMPTOMS: "Has there been any vomiting, diarrhea, constipation, or urine problems?"       no 11. PREGNANCY: "Is there any chance you are pregnant?" "When was your last menstrual period?"       no  Protocols used: ABDOMINAL PAIN - [facility]

## 2017-11-18 NOTE — Telephone Encounter (Signed)
Pt went to ER and has been admitted

## 2017-11-19 LAB — COMPREHENSIVE METABOLIC PANEL
ALT: 386 U/L — ABNORMAL HIGH (ref 0–44)
AST: 270 U/L — ABNORMAL HIGH (ref 15–41)
Albumin: 3.7 g/dL (ref 3.5–5.0)
Alkaline Phosphatase: 188 U/L — ABNORMAL HIGH (ref 38–126)
Anion gap: 6 (ref 5–15)
BUN: 12 mg/dL (ref 8–23)
CO2: 27 mmol/L (ref 22–32)
Calcium: 8.3 mg/dL — ABNORMAL LOW (ref 8.9–10.3)
Chloride: 110 mmol/L (ref 98–111)
Creatinine, Ser: 0.83 mg/dL (ref 0.44–1.00)
GFR calc Af Amer: >60 mL/min (ref >60)
GFR calc non Af Amer: >60 mL/min (ref >60)
Glucose, Bld: 108 mg/dL — ABNORMAL HIGH (ref 70–99)
Potassium: 3.6 mmol/L (ref 3.5–5.1)
Sodium: 143 mmol/L (ref 135–145)
Total Bilirubin: 3.1 mg/dL — ABNORMAL HIGH (ref 0.3–1.2)
Total Protein: 6.2 g/dL — ABNORMAL LOW (ref 6.5–8.1)

## 2017-11-19 MED ORDER — POTASSIUM CHLORIDE IN NACL 20-0.45 MEQ/L-% IV SOLN
INTRAVENOUS | Status: AC
  Administered 2017-11-19 – 2017-11-20 (×2): via INTRAVENOUS
  Filled 2017-11-19 (×2): qty 1000

## 2017-11-19 NOTE — Progress Notes (Signed)
CC: choledocho Subjective: Bili increasing, Had emesis this am  Objective: Vital signs in last 24 hours: Temp:  [97.8 F (36.6 C)-99 F (37.2 C)] 99 F (37.2 C) (08/01 1255) Pulse Rate:  [65-85] 79 (08/01 1255) Resp:  [15-20] 15 (08/01 1255) BP: (98-144)/(65-79) 98/65 (08/01 1255) SpO2:  [95 %-100 %] 96 % (08/01 1255) Weight:  [130 lb 15.3 oz (59.4 kg)] 130 lb 15.3 oz (59.4 kg) (08/01 0500) Last BM Date: 11/17/17  Intake/Output from previous day: 07/31 0701 - 08/01 0700 In: 1226 [I.V.:1226] Out: 600 [Urine:200; Emesis/NG output:400] Intake/Output this shift: Total I/O In: 597 [I.V.:597] Out: -   Physical exam: NAD , awake Abd: soft, mild TTP epigastric area, no peritonitis or murphy sign Ext: no edema and well perfused  Lab Results: CBC  Recent Labs    11/18/17 1001  WBC 7.0  HGB 15.0  HCT 42.8  PLT 276   BMET Recent Labs    11/18/17 1001 11/19/17 0301  NA 138 143  K 3.7 3.6  CL 105 110  CO2 24 27  GLUCOSE 137* 108*  BUN 12 12  CREATININE 0.69 0.83  CALCIUM 9.2 8.3*   PT/INR No results for input(s): LABPROT, INR in the last 72 hours. ABG No results for input(s): PHART, HCO3 in the last 72 hours.  Invalid input(s): PCO2, PO2  Studies/Results: US Abdomen Limited Ruq  Result Date: 11/18/2017 CLINICAL DATA:  Right upper quadrant pain for the past 6 days. EXAM: ULTRASOUND ABDOMEN LIMITED RIGHT UPPER QUADRANT COMPARISON:  None in PACs FINDINGS: Gallbladder: The gallbladder is adequately distended. There are multiple echogenic mobile shadowing stones with the largest measuring 1.7 cm in diameter. There is no gallbladder wall thickening, pericholecystic fluid, or positive sonographic Murphy's sign. Common bile duct: Diameter: 9.3 cm. There are intraluminal shadowing stones present measuring up to 1 cm in diameter. Liver: No focal lesion identified. Within normal limits in parenchymal echogenicity. There is intrahepatic ductal dilation. Portal vein is patent on  color Doppler imaging with normal direction of blood flow towards the liver. IMPRESSION: Multiple gallstones without sonographic evidence of acute cholecystitis. There is at least 1 common bile duct stone measuring 1 cm in diameter. There is intrahepatic ductal dilation. The hepatic parenchyma appears normal. Electronically Signed   By: David  Martinique M.D.   On: 11/18/2017 12:13    Anti-infectives: Anti-infectives (From admission, onward)   None      Assessment/Plan:  Choledocholithiasis w Sympt cholelithiasis ERCP in am Change clear liquids only for now Tentatively will place her on the schedule for Saturday No need for emergent surgical intervention at this time  Caroleen Hamman, MD, FACS  11/19/2017

## 2017-11-19 NOTE — Progress Notes (Signed)
Rhonda Reeves , MD [address], [place], [place], [place], [address] [address] [place], [place], [place], [address] Phone: [phone]  Fax: Ovid is being followed for Cholidocholithiasis  Day 1 of follow up   Subjective: Doing well no complaints, no fever.   Objective: Vital signs in last 24 hours: Vitals:   11/18/17 2023 11/19/17 0303 11/19/17 0500 11/19/17 1255  BP: 100/68 135/76  98/65  Pulse: 72 85  79  Resp: '16 18  15  '$ Temp: 97.9 F (36.6 C) 97.8 F (36.6 C)  99 F (37.2 C)  TempSrc: Oral Oral  Oral  SpO2: 95% 96%  96%  Weight:   130 lb 15.3 oz (59.4 kg)   Height:       Weight change:   Intake/Output Summary (Last 24 hours) at 11/19/2017 1332 Last data filed at 11/19/2017 1120 Gross per 24 hour  Intake 1823 ml  Output 600 ml  Net 1223 ml     Exam: Heart:: Regular rate and rhythm, S1S2 present or without murmur or extra heart sounds Lungs: normal, clear to auscultation and clear to auscultation and percussion Abdomen: soft, nontender, normal bowel sounds   Lab Results: Hepatic Function Latest Ref Rng & Units 11/19/2017 11/18/2017 03/31/2017  Total Protein 6.5 - 8.1 g/dL 6.2(L) 7.2 7.3  Albumin 3.5 - 5.0 g/dL 3.7 4.4 4.8  AST 15 - 41 U/L 270(H) 425(H) 21  ALT 0 - 44 U/L 386(H) 417(H) 19  Alk Phosphatase 38 - 126 U/L 188(H) 206(H) 75  Total Bilirubin 0.3 - 1.2 mg/dL 3.1(H) 2.0(H) 0.3    Micro Results: No results found for this or any previous visit (from the past 240 hour(s)). Studies/Results: US Abdomen Limited Ruq  Result Date: 11/18/2017 CLINICAL DATA:  Right upper quadrant pain for the past 6 days. EXAM: ULTRASOUND ABDOMEN LIMITED RIGHT UPPER QUADRANT COMPARISON:  None in PACs FINDINGS: Gallbladder: The gallbladder is adequately distended. There are multiple echogenic mobile shadowing stones with the largest measuring 1.7 cm in diameter. There is no gallbladder wall thickening, pericholecystic fluid, or positive  sonographic Murphy's sign. Common bile duct: Diameter: 9.3 cm. There are intraluminal shadowing stones present measuring up to 1 cm in diameter. Liver: No focal lesion identified. Within normal limits in parenchymal echogenicity. There is intrahepatic ductal dilation. Portal vein is patent on color Doppler imaging with normal direction of blood flow towards the liver. IMPRESSION: Multiple gallstones without sonographic evidence of acute cholecystitis. There is at least 1 common bile duct stone measuring 1 cm in diameter. There is intrahepatic ductal dilation. The hepatic parenchyma appears normal. Electronically Signed   By: David  Martinique M.D.   On: 11/18/2017 12:13   Medications: I have reviewed the patient's current medications. Scheduled Meds: . enoxaparin (LOVENOX) injection  40 mg Subcutaneous Q24H  . FLUoxetine  10 mg Oral Daily  . lamoTRIgine  200 mg Oral Daily  . OLANZapine  2.5 mg Oral QHS   Continuous Infusions: . [START ON 11/20/2017] 0.45 % NaCl with KCl 20 mEq / L 50 mL/hr at 11/19/17 1118   PRN Meds:.acetaminophen **OR** acetaminophen, albuterol, morphine injection, ondansetron **OR** ondansetron (ZOFRAN) IV, oxyCODONE, polyethylene glycol   Assessment: Active Problems:   Choledocholithiasis   Rhonda Reeves is a 62 y.o. y/o female  Presents to the ER with biliary colic, choledocholithiasis . No evidence of cholangitis.    Plan  1. Watch for fever, if she does get fever then will need antibiotics for  cholangitis  And urgent ERCP,otherwise Dr Allen Norris scheduled to   do ERCP tomorrow  .   2. Dr Dahlia Byes has planned Cholecystectomy on Saturday .     LOS: 1 day   Rhonda Bellows, MD 11/19/2017, 1:32 PM

## 2017-11-19 NOTE — Progress Notes (Signed)
[facility] at [place] NAME: Rhonda Reeves    MR#:  [id number]  DATE OF BIRTH:  11-13-1955  SUBJECTIVE:  CHIEF COMPLAINT:   Chief Complaint  Patient presents with  . Abdominal Pain   Continues to have intermittent abdominal pain.  One episode of vomiting.  Waiting for ERCP.  Afebrile.  REVIEW OF SYSTEMS:    Review of Systems  Constitutional: Positive for malaise/fatigue. Negative for chills and fever.  HENT: Negative for sore throat.   Eyes: Negative for blurred vision, double vision and pain.  Respiratory: Negative for cough, hemoptysis, shortness of breath and wheezing.   Cardiovascular: Negative for chest pain, palpitations, orthopnea and leg swelling.  Gastrointestinal: Positive for abdominal pain and vomiting. Negative for constipation, diarrhea, heartburn and nausea.  Genitourinary: Negative for dysuria and hematuria.  Musculoskeletal: Negative for back pain and joint pain.  Skin: Negative for rash.  Neurological: Negative for sensory change, speech change, focal weakness and headaches.  Endo/Heme/Allergies: Does not bruise/bleed easily.  Psychiatric/Behavioral: Negative for depression. The patient is not nervous/anxious.     DRUG ALLERGIES:   Allergies  Allergen Reactions  . Sulfa Antibiotics     VITALS:  Blood pressure 98/65, pulse 79, temperature 99 F (37.2 C), temperature source Oral, resp. rate 15, height 5\' 2"  (1.575 m), weight 59.4 kg (130 lb 15.3 oz), SpO2 96 %.  PHYSICAL EXAMINATION:   Physical Exam  GENERAL:  62 y.o.-year-old patient lying in the bed with no acute distress.  EYES: Pupils equal, round, reactive to light and accommodation. No scleral icterus. Extraocular muscles intact.  HEENT: Head atraumatic, normocephalic. Oropharynx and nasopharynx clear.  NECK:  Supple, no jugular venous distention. No thyroid enlargement, no tenderness.  LUNGS: Normal breath sounds bilaterally, no wheezing, rales, rhonchi.  No use of accessory muscles of respiration.  CARDIOVASCULAR: S1, S2 normal. No murmurs, rubs, or gallops.  ABDOMEN: Soft, right upper quadrant and epigastric area tenderness, nondistended. Bowel sounds present. No organomegaly or mass.  EXTREMITIES: No cyanosis, clubbing or edema b/l.    NEUROLOGIC: Cranial nerves II through XII are intact. No focal Motor or sensory deficits b/l.   PSYCHIATRIC: The patient is alert and oriented x 3.  SKIN: No obvious rash, lesion, or ulcer.   LABORATORY PANEL:   CBC Recent Labs  Lab 11/18/17 1001  WBC 7.0  HGB 15.0  HCT 42.8  PLT 276   ------------------------------------------------------------------------------------------------------------------ Chemistries  Recent Labs  Lab 11/19/17 0301  NA 143  K 3.6  CL 110  CO2 27  GLUCOSE 108*  BUN 12  CREATININE 0.83  CALCIUM 8.3*  AST 270*  ALT 386*  ALKPHOS 188*  BILITOT 3.1*   ------------------------------------------------------------------------------------------------------------------  Cardiac Enzymes No results for input(s): TROPONINI in the last 168 hours. ------------------------------------------------------------------------------------------------------------------  RADIOLOGY:  US Abdomen Limited Ruq  Result Date: 11/18/2017 CLINICAL DATA:  Right upper quadrant pain for the past 6 days. EXAM: ULTRASOUND ABDOMEN LIMITED RIGHT UPPER QUADRANT COMPARISON:  None in PACs FINDINGS: Gallbladder: The gallbladder is adequately distended. There are multiple echogenic mobile shadowing stones with the largest measuring 1.7 cm in diameter. There is no gallbladder wall thickening, pericholecystic fluid, or positive sonographic Murphy's sign. Common bile duct: Diameter: 9.3 cm. There are intraluminal shadowing stones present measuring up to 1 cm in diameter. Liver: No focal lesion identified. Within normal limits in parenchymal echogenicity. There is intrahepatic ductal dilation. Portal vein is  patent on color Doppler imaging with normal direction of blood flow towards the  liver. IMPRESSION: Multiple gallstones without sonographic evidence of acute cholecystitis. There is at least 1 common bile duct stone measuring 1 cm in diameter. There is intrahepatic ductal dilation. The hepatic parenchyma appears normal. Electronically Signed   By: David  Martinique M.D.   On: 11/18/2017 12:13     ASSESSMENT AND PLAN:   *Choledocholithiasis with multiple gallstones in the gallbladder.  Case discussed with GI. Scheduled for ERCP in the morning Pain medications added as needed.  Start IV Zosyn if any febrile episodes  Patient to have cholecystectomy after ERCP  *Transaminitis due to CBD stone.   Repeat liver function test tomorrow  *Depression.  Continue home medications.  *DVT prophylaxis with Lovenox  All the records are reviewed and case discussed with Care Management/Social Worker Management plans discussed with the patient, family and they are in agreement.  CODE STATUS: Full code  DVT Prophylaxis: SCDs  TOTAL TIME TAKING CARE OF THIS PATIENT: 35 minutes.   POSSIBLE D/C IN 2-3 DAYS, DEPENDING ON CLINICAL CONDITION.  Leia Alf Colon Rueth M.D on 11/19/2017 at 1:07 PM  Between 7am to 6pm - Pager - [phone]  After 6pm go to [email or web address] - password [email or web address] [facility]  Office  [phone]  CC: Primary care physician; Guadalupe Maple, MD  Note: This dictation was prepared with Dragon dictation along with smaller phrase technology. Any transcriptional errors that result from this process are unintentional.

## 2017-11-20 ENCOUNTER — Encounter
Admission: EM | Disposition: A | Discharge status: Home / Self Care | Payer: Self-pay | Source: Home / Self Care | Attending: Internal Medicine

## 2017-11-20 ENCOUNTER — Inpatient Hospital Stay: Payer: 59 | Admitting: Anesthesiology

## 2017-11-20 ENCOUNTER — Inpatient Hospital Stay: Payer: 59

## 2017-11-20 HISTORY — PX: ENDOSCOPIC RETROGRADE CHOLANGIOPANCREATOGRAPHY (ERCP) WITH PROPOFOL: SHX5810

## 2017-11-20 LAB — COMPREHENSIVE METABOLIC PANEL
ALT: 241 U/L — ABNORMAL HIGH (ref 0–44)
AST: 93 U/L — ABNORMAL HIGH (ref 15–41)
Albumin: 3.3 g/dL — ABNORMAL LOW (ref 3.5–5.0)
Alkaline Phosphatase: 169 U/L — ABNORMAL HIGH (ref 38–126)
Anion gap: 6 (ref 5–15)
BUN: 8 mg/dL (ref 8–23)
CO2: 27 mmol/L (ref 22–32)
Calcium: 8.4 mg/dL — ABNORMAL LOW (ref 8.9–10.3)
Chloride: 110 mmol/L (ref 98–111)
Creatinine, Ser: 0.62 mg/dL (ref 0.44–1.00)
GFR calc Af Amer: >60 mL/min (ref >60)
GFR calc non Af Amer: >60 mL/min (ref >60)
Glucose, Bld: 93 mg/dL (ref 70–99)
Potassium: 3.8 mmol/L (ref 3.5–5.1)
Sodium: 143 mmol/L (ref 135–145)
Total Bilirubin: 1.1 mg/dL (ref 0.3–1.2)
Total Protein: 5.7 g/dL — ABNORMAL LOW (ref 6.5–8.1)

## 2017-11-20 SURGERY — ENDOSCOPIC RETROGRADE CHOLANGIOPANCREATOGRAPHY (ERCP) WITH PROPOFOL
Anesthesia: General

## 2017-11-20 MED ORDER — PROPOFOL 500 MG/50ML IV EMUL
INTRAVENOUS | Status: AC
  Filled 2017-11-20: qty 50

## 2017-11-20 MED ORDER — PROPOFOL 10 MG/ML IV BOLUS
INTRAVENOUS | Status: AC
  Filled 2017-11-20: qty 20

## 2017-11-20 MED ORDER — PROPOFOL 10 MG/ML IV BOLUS
INTRAVENOUS | Status: DC | PRN
  Administered 2017-11-20: 60 mg via INTRAVENOUS
  Administered 2017-11-20: 30 mg via INTRAVENOUS
  Administered 2017-11-20: 10 mg via INTRAVENOUS

## 2017-11-20 MED ORDER — SODIUM CHLORIDE 0.9 % IV SOLN
INTRAVENOUS | Status: DC
  Administered 2017-11-20: 13:00:00 via INTRAVENOUS

## 2017-11-20 MED ORDER — LIDOCAINE HCL (PF) 2 % IJ SOLN
INTRAMUSCULAR | Status: AC
  Filled 2017-11-20: qty 10

## 2017-11-20 MED ORDER — SODIUM CHLORIDE 0.9 % IV SOLN
3.0000 g | Freq: Four times a day (QID) | INTRAVENOUS | Status: DC
  Administered 2017-11-20 – 2017-11-22 (×5): 3 g via INTRAVENOUS
  Filled 2017-11-20 (×10): qty 3

## 2017-11-20 MED ORDER — INDOMETHACIN 50 MG RE SUPP
100.0000 mg | Freq: Once | RECTAL | Status: AC
  Administered 2017-11-20: 100 mg via RECTAL

## 2017-11-20 MED ORDER — LIDOCAINE HCL (CARDIAC) PF 100 MG/5ML IV SOSY
PREFILLED_SYRINGE | INTRAVENOUS | Status: DC | PRN
  Administered 2017-11-20: 100 mg via INTRAVENOUS

## 2017-11-20 MED ORDER — PROPOFOL 500 MG/50ML IV EMUL
INTRAVENOUS | Status: DC | PRN
  Administered 2017-11-20: 120 ug/kg/min via INTRAVENOUS

## 2017-11-20 MED ORDER — INDOMETHACIN 50 MG RE SUPP
RECTAL | Status: AC
  Filled 2017-11-20: qty 1

## 2017-11-20 NOTE — Anesthesia Preprocedure Evaluation (Addendum)
Anesthesia Evaluation  Patient identified by MRN, date of birth, ID band Patient awake    Reviewed: Allergy & Precautions, H&P , NPO status , reviewed documented beta blocker date and time   Airway Mallampati: II  TM Distance: >3 FB Neck ROM: full    Dental  (+) Chipped   Pulmonary former smoker,    Pulmonary exam normal        Cardiovascular Normal cardiovascular exam     Neuro/Psych PSYCHIATRIC DISORDERS Anxiety Depression    GI/Hepatic   Endo/Other    Renal/GU      Musculoskeletal   Abdominal   Peds  Hematology   Anesthesia Other Findings Past Medical History: No date: Depression No date: Osteoporosis  Past Surgical History: No date: ABDOMINAL HYSTERECTOMY No date: HEMORRHOID SURGERY  BMI    Body Mass Index:  24.15 kg/m      Reproductive/Obstetrics                            Anesthesia Physical Anesthesia Plan  ASA: II  Anesthesia Plan: General   Post-op Pain Management:    Induction: Intravenous  PONV Risk Score and Plan: 3 and Treatment may vary due to age or medical condition and TIVA  Airway Management Planned: Nasal Cannula and Natural Airway  Additional Equipment:   Intra-op Plan:   Post-operative Plan:   Informed Consent: I have reviewed the patients History and Physical, chart, labs and discussed the procedure including the risks, benefits and alternatives for the proposed anesthesia with the patient or authorized representative who has indicated his/her understanding and acceptance.   Dental Advisory Given  Plan Discussed with:   Anesthesia Plan Comments:        Anesthesia Quick Evaluation

## 2017-11-20 NOTE — Transfer of Care (Signed)
Immediate Anesthesia Transfer of Care Note  Patient: Rhonda Reeves  Procedure(s) Performed: ENDOSCOPIC RETROGRADE CHOLANGIOPANCREATOGRAPHY (ERCP) WITH PROPOFOL (N/A )  Patient Location: PACU and Endoscopy Unit  Anesthesia Type:General  Level of Consciousness: awake  Airway & Oxygen Therapy: Patient Spontanous Breathing  Post-op Assessment: Report given to RN  Post vital signs: stable  Last Vitals:  Vitals Value Taken Time  BP    Temp    Pulse    Resp    SpO2      Last Pain:  Vitals:   11/20/17 0827  TempSrc:   PainSc: 0-No pain      Patients Stated Pain Goal: 1 (47/34/03 7096)  Complications: No apparent anesthesia complications

## 2017-11-20 NOTE — Progress Notes (Signed)
[facility] at [place] NAME: Talayla Doyel    MR#:  [id number]  DATE OF BIRTH:  [birth year]  SUBJECTIVE:  CHIEF COMPLAINT:   Chief Complaint  Patient presents with  . Abdominal Pain   Abdominal pain improved.  ERCP later today.  Afebrile.  REVIEW OF SYSTEMS:    Review of Systems  Constitutional: Positive for malaise/fatigue. Negative for chills and fever.  HENT: Negative for sore throat.   Eyes: Negative for blurred vision, double vision and pain.  Respiratory: Negative for cough, hemoptysis, shortness of breath and wheezing.   Cardiovascular: Negative for chest pain, palpitations, orthopnea and leg swelling.  Gastrointestinal: Positive for abdominal pain and vomiting. Negative for constipation, diarrhea, heartburn and nausea.  Genitourinary: Negative for dysuria and hematuria.  Musculoskeletal: Negative for back pain and joint pain.  Skin: Negative for rash.  Neurological: Negative for sensory change, speech change, focal weakness and headaches.  Endo/Heme/Allergies: Does not bruise/bleed easily.  Psychiatric/Behavioral: Negative for depression. The patient is not nervous/anxious.     DRUG ALLERGIES:   Allergies  Allergen Reactions  . Sulfa Antibiotics     VITALS:  Blood pressure 117/62, pulse 86, temperature 98.6 F (37 C), temperature source Oral, resp. rate 18, height 5\' 2"  (1.575 m), weight 59.9 kg (132 lb 0.9 oz), SpO2 98 %.  PHYSICAL EXAMINATION:   Physical Exam  GENERAL:  62 y.o.-year-old patient lying in the bed with no acute distress.  EYES: Pupils equal, round, reactive to light and accommodation. No scleral icterus. Extraocular muscles intact.  HEENT: Head atraumatic, normocephalic. Oropharynx and nasopharynx clear.  NECK:  Supple, no jugular venous distention. No thyroid enlargement, no tenderness.  LUNGS: Normal breath sounds bilaterally, no wheezing, rales, rhonchi. No use of accessory muscles of respiration.   CARDIOVASCULAR: S1, S2 normal. No murmurs, rubs, or gallops.  ABDOMEN: Soft, right upper quadrant and epigastric area tenderness, nondistended. Bowel sounds present. No organomegaly or mass.  EXTREMITIES: No cyanosis, clubbing or edema b/l.    NEUROLOGIC: Cranial nerves II through XII are intact. No focal Motor or sensory deficits b/l. PSYCHIATRIC: The patient is alert and oriented x 3.  SKIN: No obvious rash, lesion, or ulcer.   LABORATORY PANEL:   CBC Recent Labs  Lab 11/18/17 1001  WBC 7.0  HGB 15.0  HCT 42.8  PLT 276   ------------------------------------------------------------------------------------------------------------------ Chemistries  Recent Labs  Lab 11/20/17 0311  NA 143  K 3.8  CL 110  CO2 27  GLUCOSE 93  BUN 8  CREATININE 0.62  CALCIUM 8.4*  AST 93*  ALT 241*  ALKPHOS 169*  BILITOT 1.1   ------------------------------------------------------------------------------------------------------------------  Cardiac Enzymes No results for input(s): TROPONINI in the last 168 hours. ------------------------------------------------------------------------------------------------------------------  RADIOLOGY:  US Abdomen Limited Ruq  Result Date: 11/18/2017 CLINICAL DATA:  Right upper quadrant pain for the past 6 days. EXAM: ULTRASOUND ABDOMEN LIMITED RIGHT UPPER QUADRANT COMPARISON:  None in PACs FINDINGS: Gallbladder: The gallbladder is adequately distended. There are multiple echogenic mobile shadowing stones with the largest measuring 1.7 cm in diameter. There is no gallbladder wall thickening, pericholecystic fluid, or positive sonographic Murphy's sign. Common bile duct: Diameter: 9.3 cm. There are intraluminal shadowing stones present measuring up to 1 cm in diameter. Liver: No focal lesion identified. Within normal limits in parenchymal echogenicity. There is intrahepatic ductal dilation. Portal vein is patent on color Doppler imaging with normal  direction of blood flow towards the liver. IMPRESSION: Multiple gallstones without sonographic evidence of acute cholecystitis.  There is at least 1 common bile duct stone measuring 1 cm in diameter. There is intrahepatic ductal dilation. The hepatic parenchyma appears normal. Electronically Signed   By: David  Martinique M.D.   On: 11/18/2017 12:13     ASSESSMENT AND PLAN:   *Choledocholithiasis with multiple gallstones in the gallbladder.  Case discussed with GI. ERCP today  Patient to have cholecystectomy after ERCP. likely on Saturday  *Transaminitis due to CBD stone.   Repeat liver function test tomorrow  *Depression.  Continue home medications.  *DVT prophylaxis with Lovenox  All the records are reviewed and case discussed with Care Management/Social Worker Management plans discussed with the patient, family and they are in agreement.  CODE STATUS: Full code  DVT Prophylaxis: SCDs  TOTAL TIME TAKING CARE OF THIS PATIENT: 35 minutes.   POSSIBLE D/C IN 2-3 DAYS, DEPENDING ON CLINICAL CONDITION.  Leia Alf Samaa Ueda M.D on 11/20/2017 at 12:07 PM  Between 7am to 6pm - Pager - [phone]  After 6pm go to [email or web address] - password [email or web address] [facility]  Office  [phone]  CC: Primary care physician; Guadalupe Maple, MD  Note: This dictation was prepared with Dragon dictation along with smaller phrase technology. Any transcriptional errors that result from this process are unintentional.

## 2017-11-20 NOTE — Progress Notes (Signed)
Patient has been discussed with Dr. Dahlia Byes.  Patient is to have an ERCP today by Dr. Allen Norris who she has not yet met.  The results of the ERCP will determine the timing of a laparoscopic cholecystectomy with cholangiography.  Also endplate will be her laboratory values.  I discussed with her and her husband the rationale for this approach and the likelihood that completion of a laparoscopic cholecystectomy is high and that it can likely be done sometime this weekend but the timing of which will depend on or availability as well as her labs post ERCP.  Multiple questions were answered for them.  I discussed the options of outpatient surgery and observation and I also discussed the risks of bleeding infection recurrence conversion to an open procedure open common bile duct exploration with multiple drains as well as retained common bile duct stone.  She and her husband had multiple questions which were answered to the best my ability and will review studies as above for plans tomorrow or Sunday.

## 2017-11-20 NOTE — Op Note (Signed)
[facility] Patient Name: Rhonda Reeves Procedure Date: 11/20/2017 12:44 PM MRN: [id number] Account #: [id number] Date of Birth: [birth year] Admit Type: Inpatient Age: 62 Room: [facility] ENDO ROOM 4 Gender: Female Note Status: Finalized Procedure:            ERCP Indications:          Common bile duct stone(s) Providers:            Lucilla Lame MD, MD Medicines:            Propofol per Anesthesia Complications:        No immediate complications. Procedure:            Pre-Anesthesia Assessment:                       - Prior to the procedure, a History and Physical was                        performed, and patient medications and allergies were                        reviewed. The patient's tolerance of previous                        anesthesia was also reviewed. The risks and benefits of                        the procedure and the sedation options and risks were                        discussed with the patient. All questions were                        answered, and informed consent was obtained. Prior                        Anticoagulants: The patient has taken no previous                        anticoagulant or antiplatelet agents. ASA Grade                        Assessment: II - A patient with mild systemic disease.                        After reviewing the risks and benefits, the patient was                        deemed in satisfactory condition to undergo the                        procedure.                       After obtaining informed consent, the scope was passed                        under direct vision. Throughout the procedure, the                        patient's blood pressure, pulse,  and oxygen saturations                        were monitored continuously. The Duodenoscope was                        introduced through the mouth, and used to inject                        contrast into and used to cannulate the bile duct. The                         ERCP was accomplished without difficulty. The patient                        tolerated the procedure well. Findings:      The scout film was normal. The esophagus was successfully intubated       under direct vision. The scope was advanced to a normal major papilla in       the descending duodenum without detailed examination of the pharynx,       larynx and associated structures, and upper GI tract. The upper GI tract       was grossly normal. The bile duct was deeply cannulated with the       short-nosed traction sphincterotome. Contrast was injected. I personally       interpreted the bile duct images. There was brisk flow of contrast       through the ducts. Image quality was excellent. Contrast extended to the       entire biliary tree. The lower third of the main bile duct contained two       stones mm. A straight Roadrunner wire was passed into the biliary tree.       A 6 mm biliary sphincterotomy was made with a traction (standard)       sphincterotome using ERBE electrocautery. The sphincterotomy oozed       blood. The biliary tree was swept with a 15 mm balloon starting at the       bifurcation. Two stones were removed. No stones remained. Impression:           - Choledocholithiasis was found. Complete removal was                        accomplished by biliary sphincterotomy and balloon                        extraction.                       - A biliary sphincterotomy was performed.                       - The biliary tree was swept. Recommendation:       - Watch for pancreatitis, bleeding, perforation, and                        cholangitis.                       - Return patient to hospital ward for ongoing care.                       -  Continue present medications. Procedure Code(s):    --- Professional ---                       [phone], Endoscopic retrograde cholangiopancreatography                        (ERCP); with removal of calculi/debris from                         biliary/pancreatic duct(s)                       43262, Endoscopic retrograde cholangiopancreatography                        (ERCP); with sphincterotomy/papillotomy                       [phone], Endoscopic catheterization of the biliary ductal                        system, radiological supervision and interpretation Diagnosis Code(s):    --- Professional ---                       K80.50, Calculus of bile duct without cholangitis or                        cholecystitis without obstruction CPT copyright 2017 American Medical Association. All rights reserved. The codes documented in this report are preliminary and upon coder review may  be revised to meet current compliance requirements. Lucilla Lame MD, MD 11/20/2017 1:35:01 PM This report has been signed electronically. Number of Addenda: 0 Note Initiated On: 11/20/2017 12:44 PM      [facility]

## 2017-11-20 NOTE — Anesthesia Post-op Follow-up Note (Signed)
Anesthesia QCDR form completed.        

## 2017-11-20 NOTE — Progress Notes (Signed)
CC: choledocho Subjective: Feeling better, no pain, bili now normal. AVSS  Objective: Vital signs in last 24 hours: Temp:  [98.2 F (36.8 C)-99 F (37.2 C)] 98.6 F (37 C) (08/02 0421) Pulse Rate:  [79-86] 86 (08/02 0421) Resp:  [15-20] 18 (08/02 0421) BP: (98-117)/(61-65) 117/62 (08/02 0421) SpO2:  [96 %-98 %] 98 % (08/02 0421) Weight:  [132 lb 0.9 oz (59.9 kg)] 132 lb 0.9 oz (59.9 kg) (08/02 0452) Last BM Date: 11/17/17  Intake/Output from previous day: 08/01 0701 - 08/02 0700 In: 1984 [P.O.:510; I.V.:1474] Out: 2900 [Urine:2900] Intake/Output this shift: No intake/output data recorded.  Physical exam:  NAD, alert Abd: soft, nt, no peritonitis, no murphy Ext: no edema  And well perfused  Lab Results: CBC  Recent Labs    11/18/17 1001  WBC 7.0  HGB 15.0  HCT 42.8  PLT 276   BMET Recent Labs    11/19/17 0301 11/20/17 0311  NA 143 143  K 3.6 3.8  CL 110 110  CO2 27 27  GLUCOSE 108* 93  BUN 12 8  CREATININE 0.83 0.62  CALCIUM 8.3* 8.4*   PT/INR No results for input(s): LABPROT, INR in the last 72 hours. ABG No results for input(s): PHART, HCO3 in the last 72 hours.  Invalid input(s): PCO2, PO2  Studies/Results: US Abdomen Limited Ruq  Result Date: 11/18/2017 CLINICAL DATA:  Right upper quadrant pain for the past 6 days. EXAM: ULTRASOUND ABDOMEN LIMITED RIGHT UPPER QUADRANT COMPARISON:  None in PACs FINDINGS: Gallbladder: The gallbladder is adequately distended. There are multiple echogenic mobile shadowing stones with the largest measuring 1.7 cm in diameter. There is no gallbladder wall thickening, pericholecystic fluid, or positive sonographic Murphy's sign. Common bile duct: Diameter: 9.3 cm. There are intraluminal shadowing stones present measuring up to 1 cm in diameter. Liver: No focal lesion identified. Within normal limits in parenchymal echogenicity. There is intrahepatic ductal dilation. Portal vein is patent on color Doppler imaging with  normal direction of blood flow towards the liver. IMPRESSION: Multiple gallstones without sonographic evidence of acute cholecystitis. There is at least 1 common bile duct stone measuring 1 cm in diameter. There is intrahepatic ductal dilation. The hepatic parenchyma appears normal. Electronically Signed   By: David  Martinique M.D.   On: 11/18/2017 12:13    Anti-infectives: Anti-infectives (From admission, onward)   None      Assessment/Plan:  choledocho for ERCP today We will perform cholecystectomy over the weekend, Dr. Burt Knack is covering for me and he will post the case at his discretion.  D/W the pt and family in detail and they agree  Caroleen Hamman, MD, [facility]  11/20/2017

## 2017-11-21 ENCOUNTER — Inpatient Hospital Stay: Payer: 59 | Admitting: Anesthesiology

## 2017-11-21 ENCOUNTER — Encounter
Admission: EM | Disposition: A | Discharge status: Home / Self Care | Payer: Self-pay | Source: Home / Self Care | Attending: Internal Medicine

## 2017-11-21 ENCOUNTER — Inpatient Hospital Stay: Payer: 59

## 2017-11-21 HISTORY — PX: CHOLECYSTECTOMY: SHX55

## 2017-11-21 LAB — CBC WITH DIFFERENTIAL/PLATELET
Basophils Absolute: 0 10*3/uL (ref 0–0.1)
Basophils Relative: 0 %
Eosinophils Absolute: 0.2 10*3/uL (ref 0–0.7)
Eosinophils Relative: 3 %
HCT: 39.1 % (ref 35.0–47.0)
Hemoglobin: 13.8 g/dL (ref 12.0–16.0)
Lymphocytes Relative: 22 %
Lymphs Abs: 1.3 10*3/uL (ref 1.0–3.6)
MCH: 31.7 pg (ref 26.0–34.0)
MCHC: 35.3 g/dL (ref 32.0–36.0)
MCV: 89.7 fL (ref 80.0–100.0)
Monocytes Absolute: 0.4 10*3/uL (ref 0.2–0.9)
Monocytes Relative: 8 %
Neutro Abs: 3.8 10*3/uL (ref 1.4–6.5)
Neutrophils Relative %: 67 %
Platelets: 227 10*3/uL (ref 150–440)
RBC: 4.36 MIL/uL (ref 3.80–5.20)
RDW: 12.5 % (ref 11.5–14.5)
WBC: 5.8 10*3/uL (ref 3.6–11.0)

## 2017-11-21 LAB — COMPREHENSIVE METABOLIC PANEL
ALT: 200 U/L — ABNORMAL HIGH (ref 0–44)
AST: 96 U/L — ABNORMAL HIGH (ref 15–41)
Albumin: 3.5 g/dL (ref 3.5–5.0)
Alkaline Phosphatase: 210 U/L — ABNORMAL HIGH (ref 38–126)
Anion gap: 6 (ref 5–15)
BUN: 7 mg/dL — ABNORMAL LOW (ref 8–23)
CO2: 27 mmol/L (ref 22–32)
Calcium: 8.8 mg/dL — ABNORMAL LOW (ref 8.9–10.3)
Chloride: 111 mmol/L (ref 98–111)
Creatinine, Ser: 0.59 mg/dL (ref 0.44–1.00)
GFR calc Af Amer: >60 mL/min (ref >60)
GFR calc non Af Amer: >60 mL/min (ref >60)
Glucose, Bld: 85 mg/dL (ref 70–99)
Potassium: 3.6 mmol/L (ref 3.5–5.1)
Sodium: 144 mmol/L (ref 135–145)
Total Bilirubin: 2.9 mg/dL — ABNORMAL HIGH (ref 0.3–1.2)
Total Protein: 6.5 g/dL (ref 6.5–8.1)

## 2017-11-21 LAB — SURGICAL PCR SCREEN
MRSA, PCR: NEGATIVE
Staphylococcus aureus: NEGATIVE

## 2017-11-21 LAB — LIPASE, BLOOD: Lipase: 153 U/L — ABNORMAL HIGH (ref 11–51)

## 2017-11-21 SURGERY — LAPAROSCOPIC CHOLECYSTECTOMY WITH INTRAOPERATIVE CHOLANGIOGRAM
Anesthesia: General

## 2017-11-21 MED ORDER — SUGAMMADEX SODIUM 200 MG/2ML IV SOLN
INTRAVENOUS | Status: AC
  Filled 2017-11-21: qty 2

## 2017-11-21 MED ORDER — DEXAMETHASONE SODIUM PHOSPHATE 10 MG/ML IJ SOLN
INTRAMUSCULAR | Status: AC
  Filled 2017-11-21: qty 1

## 2017-11-21 MED ORDER — BUPIVACAINE-EPINEPHRINE (PF) 0.25% -1:200000 IJ SOLN
INTRAMUSCULAR | Status: DC | PRN
  Administered 2017-11-21: 30 mL

## 2017-11-21 MED ORDER — PROPOFOL 10 MG/ML IV BOLUS
INTRAVENOUS | Status: DC | PRN
  Administered 2017-11-21: 150 mg via INTRAVENOUS

## 2017-11-21 MED ORDER — LACTATED RINGERS IV SOLN
INTRAVENOUS | Status: DC | PRN
  Administered 2017-11-21: 11:00:00 via INTRAVENOUS

## 2017-11-21 MED ORDER — FENTANYL CITRATE (PF) 100 MCG/2ML IJ SOLN
INTRAMUSCULAR | Status: AC
  Filled 2017-11-21: qty 2

## 2017-11-21 MED ORDER — PROPOFOL 10 MG/ML IV BOLUS
INTRAVENOUS | Status: AC
  Filled 2017-11-21: qty 20

## 2017-11-21 MED ORDER — ONDANSETRON HCL 4 MG/2ML IJ SOLN
INTRAMUSCULAR | Status: DC | PRN
  Administered 2017-11-21: 4 mg via INTRAVENOUS

## 2017-11-21 MED ORDER — KETOROLAC TROMETHAMINE 30 MG/ML IJ SOLN
INTRAMUSCULAR | Status: DC | PRN
  Administered 2017-11-21: 30 mg via INTRAVENOUS

## 2017-11-21 MED ORDER — ROCURONIUM BROMIDE 100 MG/10ML IV SOLN
INTRAVENOUS | Status: DC | PRN
  Administered 2017-11-21: 40 mg via INTRAVENOUS

## 2017-11-21 MED ORDER — DEXAMETHASONE SODIUM PHOSPHATE 10 MG/ML IJ SOLN
INTRAMUSCULAR | Status: DC | PRN
  Administered 2017-11-21: 10 mg via INTRAVENOUS

## 2017-11-21 MED ORDER — KETOROLAC TROMETHAMINE 30 MG/ML IJ SOLN
INTRAMUSCULAR | Status: AC
  Filled 2017-11-21: qty 1

## 2017-11-21 MED ORDER — EPHEDRINE SULFATE 50 MG/ML IJ SOLN
INTRAMUSCULAR | Status: DC | PRN
  Administered 2017-11-21 (×3): 10 mg via INTRAVENOUS

## 2017-11-21 MED ORDER — ONDANSETRON HCL 4 MG/2ML IJ SOLN: 4.0000 mg | Freq: Once | INTRAMUSCULAR | Status: DC | PRN

## 2017-11-21 MED ORDER — FENTANYL CITRATE (PF) 100 MCG/2ML IJ SOLN: 25.0000 ug | INTRAMUSCULAR | Status: DC | PRN

## 2017-11-21 MED ORDER — LIDOCAINE HCL (PF) 2 % IJ SOLN
INTRAMUSCULAR | Status: AC
  Filled 2017-11-21: qty 10

## 2017-11-21 MED ORDER — FENTANYL CITRATE (PF) 100 MCG/2ML IJ SOLN
INTRAMUSCULAR | Status: DC | PRN
  Administered 2017-11-21: 100 ug via INTRAVENOUS
  Administered 2017-11-21 (×2): 50 ug via INTRAVENOUS

## 2017-11-21 MED ORDER — SUGAMMADEX SODIUM 200 MG/2ML IV SOLN
INTRAVENOUS | Status: DC | PRN
  Administered 2017-11-21: 200 mg via INTRAVENOUS

## 2017-11-21 MED ORDER — BUPIVACAINE-EPINEPHRINE (PF) 0.25% -1:200000 IJ SOLN
INTRAMUSCULAR | Status: AC
  Filled 2017-11-21: qty 30

## 2017-11-21 MED ORDER — ACETAMINOPHEN 10 MG/ML IV SOLN
INTRAVENOUS | Status: DC | PRN
  Administered 2017-11-21: 1000 mg via INTRAVENOUS

## 2017-11-21 MED ORDER — LIDOCAINE HCL (CARDIAC) PF 100 MG/5ML IV SOSY
PREFILLED_SYRINGE | INTRAVENOUS | Status: DC | PRN
  Administered 2017-11-21: 100 mg via INTRAVENOUS

## 2017-11-21 MED ORDER — ACETAMINOPHEN 10 MG/ML IV SOLN
INTRAVENOUS | Status: AC
  Filled 2017-11-21: qty 100

## 2017-11-21 MED ORDER — ONDANSETRON HCL 4 MG/2ML IJ SOLN
INTRAMUSCULAR | Status: AC
  Filled 2017-11-21: qty 2

## 2017-11-21 MED ORDER — IOTHALAMATE MEGLUMINE 60 % INJ SOLN
INTRAMUSCULAR | Status: DC | PRN
  Administered 2017-11-21: 15 mL

## 2017-11-21 SURGICAL SUPPLY — 43 items
ADHESIVE MASTISOL STRL (MISCELLANEOUS) ×3 IMPLANT
APPLIER CLIP ROT 10 11.4 M/L (STAPLE) ×3
BLADE SURG SZ11 CARB STEEL (BLADE) ×3 IMPLANT
CANISTER SUCT 1200ML W/VALVE (MISCELLANEOUS) ×3 IMPLANT
CATH CHOLANGI 4FR 420404F (CATHETERS) ×3 IMPLANT
CHLORAPREP W/TINT 26ML (MISCELLANEOUS) ×3 IMPLANT
CLIP APPLIE ROT 10 11.4 M/L (STAPLE) ×1 IMPLANT
CLOSURE WOUND 1/2 X4 (GAUZE/BANDAGES/DRESSINGS) ×1
CONRAY 60ML FOR OR (MISCELLANEOUS) ×3 IMPLANT
DRAPE C-ARM XRAY 36X54 (DRAPES) ×3 IMPLANT
ELECT REM PT RETURN 9FT ADLT (ELECTROSURGICAL) ×3
ELECTRODE REM PT RTRN 9FT ADLT (ELECTROSURGICAL) ×1 IMPLANT
GLOVE BIO SURGEON STRL SZ8 (GLOVE) ×18 IMPLANT
GOWN STRL REUS W/ TWL LRG LVL3 (GOWN DISPOSABLE) ×3 IMPLANT
GOWN STRL REUS W/TWL LRG LVL3 (GOWN DISPOSABLE) ×6
IRRIGATION STRYKERFLOW (MISCELLANEOUS) ×1 IMPLANT
IRRIGATOR STRYKERFLOW (MISCELLANEOUS) ×3
IV CATH ANGIO 12GX3 LT BLUE (NEEDLE) ×3 IMPLANT
IV NS 1000ML (IV SOLUTION) ×2
IV NS 1000ML BAXH (IV SOLUTION) ×1 IMPLANT
JACKSON PRATT 10 (INSTRUMENTS) IMPLANT
KIT TURNOVER KIT A (KITS) ×3 IMPLANT
LABEL OR SOLS (LABEL) ×3 IMPLANT
NEEDLE HYPO 22GX1.5 SAFETY (NEEDLE) ×3 IMPLANT
NEEDLE VERESS 14GA 120MM (NEEDLE) ×3 IMPLANT
NS IRRIG 500ML POUR BTL (IV SOLUTION) ×3 IMPLANT
PACK LAP CHOLECYSTECTOMY (MISCELLANEOUS) ×3 IMPLANT
POUCH SPECIMEN RETRIEVAL 10MM (ENDOMECHANICALS) ×3 IMPLANT
SCISSORS METZENBAUM CVD 33 (INSTRUMENTS) ×3 IMPLANT
SLEEVE ENDOPATH XCEL 5M (ENDOMECHANICALS) ×6 IMPLANT
SPONGE GAUZE 2X2 8PLY STER LF (GAUZE/BANDAGES/DRESSINGS) ×3
SPONGE GAUZE 2X2 8PLY STRL LF (GAUZE/BANDAGES/DRESSINGS) ×6 IMPLANT
SPONGE LAP 18X18 RF (DISPOSABLE) ×3 IMPLANT
SPONGE VERSALON 4X4 4PLY (MISCELLANEOUS) IMPLANT
STRIP CLOSURE SKIN 1/2X4 (GAUZE/BANDAGES/DRESSINGS) ×2 IMPLANT
SUT MNCRL 4-0 (SUTURE) ×2
SUT MNCRL 4-0 27XMFL (SUTURE) ×1
SUT VICRYL 0 AB UR-6 (SUTURE) ×3 IMPLANT
SUTURE MNCRL 4-0 27XMF (SUTURE) ×1 IMPLANT
SYR 20CC LL (SYRINGE) ×3 IMPLANT
TROCAR XCEL NON-BLD 11X100MML (ENDOMECHANICALS) ×3 IMPLANT
TROCAR XCEL NON-BLD 5MMX100MML (ENDOMECHANICALS) ×3 IMPLANT
TUBING INSUFFLATION (TUBING) ×3 IMPLANT

## 2017-11-21 NOTE — Progress Notes (Signed)
Patient for laparoscopic cholecystectomy this morning.  This will be delayed due to emergency surgery as planned.  Her lipase is slightly elevated but she feels well and is ready for surgery.  She had no questions concerning the risks that we discussed yesterday.  Planning laparoscopic cholecystectomy with cholangiography later today. Discussed with husband in hallway later.

## 2017-11-21 NOTE — Anesthesia Preprocedure Evaluation (Signed)
Anesthesia Evaluation  Patient identified by MRN, date of birth, ID band Patient awake    Reviewed: Allergy & Precautions, NPO status , Patient's Chart, lab work & pertinent test results  History of Anesthesia Complications Negative for: history of anesthetic complications  Airway Mallampati: II       Dental   Pulmonary neg sleep apnea, neg COPD, former smoker,           Cardiovascular (-) hypertension(-) Past MI and (-) CHF (-) dysrhythmias (-) Valvular Problems/Murmurs     Neuro/Psych neg Seizures Anxiety Depression    GI/Hepatic Neg liver ROS, neg GERD  ,  Endo/Other  neg diabetes  Renal/GU negative Renal ROS     Musculoskeletal   Abdominal   Peds  Hematology   Anesthesia Other Findings   Reproductive/Obstetrics                             Anesthesia Physical Anesthesia Plan  ASA: II and emergent  Anesthesia Plan: General   Post-op Pain Management:    Induction: Intravenous  PONV Risk Score and Plan: 3 and Dexamethasone, Ondansetron and Midazolam  Airway Management Planned: Oral ETT  Additional Equipment:   Intra-op Plan:   Post-operative Plan:   Informed Consent: I have reviewed the patients History and Physical, chart, labs and discussed the procedure including the risks, benefits and alternatives for the proposed anesthesia with the patient or authorized representative who has indicated his/her understanding and acceptance.     Plan Discussed with:   Anesthesia Plan Comments:         Anesthesia Quick Evaluation

## 2017-11-21 NOTE — Anesthesia Procedure Notes (Signed)
Procedure Name: Intubation Date/Time: 11/21/2017 11:20 AM Performed by: Milus Height, CRNA Pre-anesthesia Checklist: Patient identified, Patient being monitored, Timeout performed, Emergency Drugs available and Suction available Patient Re-evaluated:Patient Re-evaluated prior to induction Oxygen Delivery Method: Circle system utilized Preoxygenation: Pre-oxygenation with 100% oxygen Induction Type: IV induction Ventilation: Mask ventilation without difficulty Laryngoscope Size: Mac and 3 Grade View: Grade I Tube type: Oral Tube size: 7.0 mm Number of attempts: 1 Airway Equipment and Method: Stylet Placement Confirmation: ETT inserted through vocal cords under direct vision,  positive ETCO2 and breath sounds checked- equal and bilateral Secured at: 20 cm Tube secured with: Tape Dental Injury: Teeth and Oropharynx as per pre-operative assessment

## 2017-11-21 NOTE — Progress Notes (Signed)
[place] [facility] [place] NAME: Rhonda Reeves    MR#:  [id number]  DATE OF BIRTH:  [birth year]  SUBJECTIVE:   patient s/p choley this am  REVIEW OF SYSTEMS:    Review of Systems  Constitutional: Negative for fever, chills weight loss HENT: Negative for ear pain, nosebleeds, congestion, facial swelling, rhinorrhea, neck pain, neck stiffness and ear discharge.   Respiratory: Negative for cough, shortness of breath, wheezing  Cardiovascular: Negative for chest pain, palpitations and leg swelling.  Gastrointestinal: Negative for heartburn, abdominal pain, vomiting, diarrhea or consitpation Genitourinary: Negative for dysuria, urgency, frequency, hematuria Musculoskeletal: Negative for back pain or joint pain Neurological: Negative for dizziness, seizures, syncope, focal weakness,  numbness and headaches.  Hematological: Does not bruise/bleed easily.  Psychiatric/Behavioral: Negative for hallucinations, confusion, dysphoric mood    Tolerating Diet: yes      DRUG ALLERGIES:   Allergies  Allergen Reactions  . Sulfa Antibiotics     VITALS:  Blood pressure 126/65, pulse (!) 116, temperature (!) 97.3 F (36.3 C), resp. rate 14, height 5\' 2"  (1.575 m), weight 132 lb 0.9 oz (59.9 kg), SpO2 100 %.  PHYSICAL EXAMINATION:  Constitutional: Appears well-developed and well-nourished. No distress. HENT: Normocephalic. Marland Kitchen Oropharynx is clear and moist.  Eyes: Conjunctivae and EOM are normal. PERRLA, no scleral icterus.  Neck: Normal ROM. Neck supple. No JVD. No tracheal deviation. CVS: RRR, S1/S2 +, no murmurs, no gallops, no carotid bruit.  Pulmonary: Effort and breath sounds normal, no stridor, rhonchi, wheezes, rales.  Abdominal: Soft. BS +,  no distension,++mild tenderness,no rebound or guarding.  Musculoskeletal: Normal range of motion. No edema and no tenderness.  Neuro: Alert. CN 2-12 grossly intact. No focal deficits. Skin: Skin is warm and  dry. No rash noted. Psychiatric: Normal mood and affect.      LABORATORY PANEL:   CBC Recent Labs  Lab 11/21/17 0414  WBC 5.8  HGB 13.8  HCT 39.1  PLT 227   ------------------------------------------------------------------------------------------------------------------  Chemistries  Recent Labs  Lab 11/21/17 0414  NA 144  K 3.6  CL 111  CO2 27  GLUCOSE 85  BUN 7*  CREATININE 0.59  CALCIUM 8.8*  AST 96*  ALT 200*  ALKPHOS 210*  BILITOT 2.9*   ------------------------------------------------------------------------------------------------------------------  Cardiac Enzymes No results for input(s): TROPONINI in the last 168 hours. ------------------------------------------------------------------------------------------------------------------  RADIOLOGY:  Dg Cholangiogram Operative  Result Date: 11/21/2017 CLINICAL DATA:  Intraoperative cholangiogram during laparoscopic cholecystectomy. EXAM: INTRAOPERATIVE CHOLANGIOGRAM FLUOROSCOPY TIME:  27 seconds COMPARISON:  Right upper quadrant abdominal ultrasound - 11/18/2017 FINDINGS: Intraoperative cholangiographic images of the right upper abdominal quadrant during laparoscopic cholecystectomy are provided for review. Surgical clips overlie the expected location of the gallbladder fossa. Contrast injection demonstrates selective cannulation of the central aspect of the cystic duct. There is passage of contrast through the central aspect of the cystic duct with filling of a moderately dilated common bile duct. There is eventual passage of contrast though the CBD and into the descending portion of the duodenum. There is reflux of injected contrast into the common hepatic duct and central aspect of a mildly dilated intrahepatic biliary system. There are no discrete filling defects within the opacified portions of the biliary system to suggest the presence of choledocholithiasis. IMPRESSION: No definite evidence of occlusive  choledocholithiasis, however there was delayed passage of contrast through the distal aspect of CBD, note is made of mild dilatation of the intrahepatic biliary system and choledocholithiasis was suspected on preceding right  upper quadrant abdominal ultrasound. Correlation with the operative report is advised. Further evaluation with ERCP could be performed as indicated. Electronically Signed   By: Sandi Mariscal M.D.   On: 11/21/2017 12:01   Dg C-arm 1-60 Min-no Report  Result Date: 11/20/2017 Fluoroscopy was utilized by the requesting physician.  No radiographic interpretation.     ASSESSMENT AND PLAN:   62 year old female with history of depression who presented to the ER due to abdominal pain.  1.  Choledocholithiasis: Patient is status post ERCP and cholecystectomy and doing well. 2.  Transaminitis which is improving  3.  Depression: Continue Prozac and Lamictal   Management plans discussed with the patient and she is in agreement.  CODE STATUS: full  TOTAL TIME TAKING CARE OF THIS PATIENT: 21 minutes.     POSSIBLE D/C tomorrow, DEPENDING ON CLINICAL CONDITION.   Rhonda Reeves M.D on 11/21/2017 at 12:33 PM  Between 7am to 6pm - Pager - [phone] After 6pm go to [email or web address] - password [email or web address] [facility]  Office  [phone]  CC: Primary care physician; Rhonda Maple, MD  Note: This dictation was prepared with Dragon dictation along with smaller phrase technology. Any transcriptional errors that result from this process are unintentional.

## 2017-11-21 NOTE — Transfer of Care (Signed)
Immediate Anesthesia Transfer of Care Note  Patient: Rhonda Reeves  Procedure(s) Performed: LAPAROSCOPIC CHOLECYSTECTOMY WITH INTRAOPERATIVE CHOLANGIOGRAM (N/A )  Patient Location: PACU  Anesthesia Type:General  Level of Consciousness: sedated and drowsy  Airway & Oxygen Therapy: Patient Spontanous Breathing and Patient connected to face mask oxygen  Post-op Assessment: Report given to RN and Post -op Vital signs reviewed and stable  Post vital signs: Reviewed and stable  Last Vitals:  Vitals Value Taken Time  BP 126/65 11/21/2017 12:17 PM  Temp 36.3 C 11/21/2017 12:17 PM  Pulse 114 11/21/2017 12:19 PM  Resp 17 11/21/2017 12:19 PM  SpO2 100 % 11/21/2017 12:19 PM  Vitals shown include unvalidated device data.  Last Pain:  Vitals:   11/21/17 1027  TempSrc: Oral  PainSc:       Patients Stated Pain Goal: 1 (59/09/31 1216)  Complications: No apparent anesthesia complications

## 2017-11-21 NOTE — Progress Notes (Signed)
Noted she is going for cholecystectomy today .   I will sign off.  Please call me if any further GI concerns or questions.  We would like to thank you for the opportunity to participate in the care of Rhonda Reeves.

## 2017-11-21 NOTE — Anesthesia Postprocedure Evaluation (Signed)
Anesthesia Post Note  Patient: Rhonda Reeves  Procedure(s) Performed: LAPAROSCOPIC CHOLECYSTECTOMY WITH INTRAOPERATIVE CHOLANGIOGRAM (N/A )  Patient location during evaluation: PACU Anesthesia Type: General Level of consciousness: awake and alert Pain management: pain level controlled Vital Signs Assessment: post-procedure vital signs reviewed and stable Respiratory status: spontaneous breathing and respiratory function stable Cardiovascular status: stable Anesthetic complications: no     Last Vitals:  Vitals:   11/21/17 1303 11/21/17 1323  BP: 117/60 112/60  Pulse: 99 97  Resp: 19 20  Temp:  36.6 C  SpO2: 96% 96%    Last Pain:  Vitals:   11/21/17 1323  TempSrc: Oral  PainSc:                  KEPHART,WILLIAM K

## 2017-11-21 NOTE — Anesthesia Post-op Follow-up Note (Signed)
Anesthesia QCDR form completed.        

## 2017-11-21 NOTE — Progress Notes (Signed)
Preoperative Review   Patient is met in the preoperative holding area. The history is reviewed in the chart and with the patient. I personally reviewed the options and rationale as well as the risks of this procedure that have been previously discussed with the patient. All questions asked by the patient and/or family were answered to their satisfaction.  Patient agrees to proceed with this procedure at this time.  Izik Bingman E Zissel Biederman M.D. FACS  

## 2017-11-21 NOTE — Op Note (Signed)
Laparoscopic Cholecystectomy  Pre-operative Diagnosis: Choledocholithiasis  Post-operative Diagnosis: Same  Procedure: Laparoscopic cholecystectomy with C arm fluoroscopic cholangiography  Surgeon: Rhonda Reeves. Rhonda Knack, MD FACS  Anesthesia: Gen. with endotracheal tube  Assistant: Surgical tech  Procedure Details  The patient was seen again in the Holding Room. The benefits, complications, treatment options, and expected outcomes were discussed with the patient. The risks of bleeding, infection, recurrence of symptoms, failure to resolve symptoms, bile duct damage, bile duct leak, retained common bile duct stone, bowel injury, any of which could require further surgery and/or ERCP, stent, or papillotomy were reviewed with the patient. The likelihood of improving the patient's symptoms with return to their baseline status is good.  The patient had an ERCP with stone extraction and papillotomy yesterday.  The patient and/or family concurred with the proposed plan, giving informed consent.  The patient was taken to Operating Room, identified as Rhonda Reeves and the procedure verified as Laparoscopic Cholecystectomy.  A Time Out was held and the above information confirmed.  Prior to the induction of general anesthesia, antibiotic prophylaxis was administered. VTE prophylaxis was in place. General endotracheal anesthesia was then administered and tolerated well. After the induction, the abdomen was prepped with Chloraprep and draped in the sterile fashion. The patient was positioned in the supine position.  Local anesthetic  was injected into the skin near the umbilicus and an incision made. The Veress needle was placed. Pneumoperitoneum was then created with CO2 and tolerated well without any adverse changes in the patient's vital signs. A 70mm port was placed in the periumbilical position and the abdominal cavity was explored.  Two 5-mm ports were placed in the right upper quadrant and a 12 mm  epigastric port was placed all under direct vision. All skin incisions  were infiltrated with a local anesthetic agent before making the incision and placing the trocars.   The patient was positioned  in reverse Trendelenburg, tilted slightly to the patient's left.  The gallbladder was identified, the fundus grasped and retracted cephalad. Adhesions were lysed bluntly. The infundibulum was grasped and retracted laterally, exposing the peritoneum overlying the triangle of Calot. This was then divided and exposed in a blunt fashion. A critical view of the cystic duct and cystic artery was obtained.  The cystic duct was clearly identified and bluntly dissected.   The cystic lymphatics were doubly clipped and divided.  This allowed for good visualization of the cystic duct as it entered the infundibulum of the gallbladder.  Here it was clipped and incised in through a separate incision and Angiocath was placed and Rhonda Reeves catheter was placed into the cystic duct and clipped.  C-arm fluoroscopic angiography demonstrated good proximal flow of the intrahepatic ducts.  There was good flow into the duodenum without intraluminal filling defects.  The common bile duct seem to be dilated but there were no intraluminal filling defects.  The cystic duct is been cannulated.  The  The cystic duct catheter was removed and the cystic duct was doubly clipped and divided.  The cystic artery was then doubly clipped and divided.  The gallbladder was taken from the gallbladder fossa in a retrograde fashion with the electrocautery. The gallbladder was removed and placed in an Endocatch bag. The liver bed was irrigated and inspected. Hemostasis was achieved with the electrocautery. Copious irrigation was utilized and was repeatedly aspirated until clear.  The gallbladder and Endocatch sac were then removed through the epigastric port site.   Inspection of the right upper  quadrant was performed. No bleeding, bile duct  injury or leak, or bowel injury was noted. Pneumoperitoneum was released.  The epigastric port site was closed with figure-of-eight 0 Vicryl sutures. 4-0 subcuticular Monocryl was used to close the skin. Steristrips and Mastisol and sterile dressings were  applied.  The patient was then extubated and brought to the recovery room in stable condition. Sponge, lap, and needle counts were correct at closure and at the conclusion of the case.   Findings: Acute cholecystitis with dilated common bile duct.  No stones on cholangiogram.  Estimated Blood Loss: Minimal         Drains: None         Specimens: Gallbladder           Complications: none               Rhonda Reeves E. Rhonda Knack, MD, FACS

## 2017-11-22 ENCOUNTER — Encounter: Payer: Self-pay | Admitting: Surgery

## 2017-11-22 LAB — CBC WITH DIFFERENTIAL/PLATELET
Basophils Absolute: 0 10*3/uL (ref 0–0.1)
Basophils Relative: 0 %
Eosinophils Absolute: 0 10*3/uL (ref 0–0.7)
Eosinophils Relative: 0 %
HCT: 37.4 % (ref 35.0–47.0)
Hemoglobin: 13.1 g/dL (ref 12.0–16.0)
Lymphocytes Relative: 16 %
Lymphs Abs: 1 10*3/uL (ref 1.0–3.6)
MCH: 31.6 pg (ref 26.0–34.0)
MCHC: 35.1 g/dL (ref 32.0–36.0)
MCV: 90.1 fL (ref 80.0–100.0)
Monocytes Absolute: 0.4 10*3/uL (ref 0.2–0.9)
Monocytes Relative: 6 %
Neutro Abs: 5 10*3/uL (ref 1.4–6.5)
Neutrophils Relative %: 78 %
Platelets: 249 10*3/uL (ref 150–440)
RBC: 4.15 MIL/uL (ref 3.80–5.20)
RDW: 12.5 % (ref 11.5–14.5)
WBC: 6.4 10*3/uL (ref 3.6–11.0)

## 2017-11-22 LAB — COMPREHENSIVE METABOLIC PANEL
ALT: 166 U/L — ABNORMAL HIGH (ref 0–44)
AST: 61 U/L — ABNORMAL HIGH (ref 15–41)
Albumin: 3.6 g/dL (ref 3.5–5.0)
Alkaline Phosphatase: 197 U/L — ABNORMAL HIGH (ref 38–126)
Anion gap: 8 (ref 5–15)
BUN: 8 mg/dL (ref 8–23)
CO2: 26 mmol/L (ref 22–32)
Calcium: 8.6 mg/dL — ABNORMAL LOW (ref 8.9–10.3)
Chloride: 108 mmol/L (ref 98–111)
Creatinine, Ser: 0.64 mg/dL (ref 0.44–1.00)
GFR calc Af Amer: >60 mL/min (ref >60)
GFR calc non Af Amer: >60 mL/min (ref >60)
Glucose, Bld: 102 mg/dL — ABNORMAL HIGH (ref 70–99)
Potassium: 3.7 mmol/L (ref 3.5–5.1)
Sodium: 142 mmol/L (ref 135–145)
Total Bilirubin: 1 mg/dL (ref 0.3–1.2)
Total Protein: 6.3 g/dL — ABNORMAL LOW (ref 6.5–8.1)

## 2017-11-22 LAB — LIPASE, BLOOD: Lipase: 68 U/L — ABNORMAL HIGH (ref 11–51)

## 2017-11-22 MED ORDER — TRAMADOL HCL 50 MG PO TABS: 50.0000 mg | ORAL_TABLET | Freq: Four times a day (QID) | ORAL | 0 refills | Status: DC | PRN

## 2017-11-22 NOTE — Progress Notes (Signed)
1 Day Post-Op  Subjective: Patient feels much better following laparoscopic cholecystectomy.  She wants to advance her diet today.  Objective: Vital signs in last 24 hours: Temp:  [97.3 F (36.3 C)-98.7 F (37.1 C)] 98.3 F (36.8 C) (08/04 0503) Pulse Rate:  [78-116] 78 (08/04 0503) Resp:  [14-20] 16 (08/04 0503) BP: (107-139)/(56-78) 114/61 (08/04 0503) SpO2:  [95 %-100 %] 95 % (08/04 0503) Weight:  [121 lb 8 oz (55.1 kg)] 121 lb 8 oz (55.1 kg) (08/04 0500) Last BM Date: 11/20/17  Intake/Output from previous day: 08/03 0701 - 08/04 0700 In: 960 [P.O.:260; I.V.:600; IV Piggyback:100] Out: 3800 [Urine:3800] Intake/Output this shift: No intake/output data recorded.  Physical exam:  Vital signs reviewed no icterus no jaundice abdomen soft minimally tender dressings in place Nontender calves  Lab Results: CBC  Recent Labs    11/21/17 0414 11/22/17 0418  WBC 5.8 6.4  HGB 13.8 13.1  HCT 39.1 37.4  PLT 227 249   BMET Recent Labs    11/21/17 0414 11/22/17 0418  NA 144 142  K 3.6 3.7  CL 111 108  CO2 27 26  GLUCOSE 85 102*  BUN 7* 8  CREATININE 0.59 0.64  CALCIUM 8.8* 8.6*   PT/INR No results for input(s): LABPROT, INR in the last 72 hours. ABG No results for input(s): PHART, HCO3 in the last 72 hours.  Invalid input(s): PCO2, PO2  Studies/Results: Dg Cholangiogram Operative  Result Date: 11/21/2017 CLINICAL DATA:  Intraoperative cholangiogram during laparoscopic cholecystectomy. EXAM: INTRAOPERATIVE CHOLANGIOGRAM FLUOROSCOPY TIME:  27 seconds COMPARISON:  Right upper quadrant abdominal ultrasound - 11/18/2017 FINDINGS: Intraoperative cholangiographic images of the right upper abdominal quadrant during laparoscopic cholecystectomy are provided for review. Surgical clips overlie the expected location of the gallbladder fossa. Contrast injection demonstrates selective cannulation of the central aspect of the cystic duct. There is passage of contrast through the  central aspect of the cystic duct with filling of a moderately dilated common bile duct. There is eventual passage of contrast though the CBD and into the descending portion of the duodenum. There is reflux of injected contrast into the common hepatic duct and central aspect of a mildly dilated intrahepatic biliary system. There are no discrete filling defects within the opacified portions of the biliary system to suggest the presence of choledocholithiasis. IMPRESSION: No definite evidence of occlusive choledocholithiasis, however there was delayed passage of contrast through the distal aspect of CBD, note is made of mild dilatation of the intrahepatic biliary system and choledocholithiasis was suspected on preceding right upper quadrant abdominal ultrasound. Correlation with the operative report is advised. Further evaluation with ERCP could be performed as indicated. Electronically Signed   By: Sandi Mariscal M.D.   On: 11/21/2017 12:01   Dg C-arm 1-60 Min-no Report  Result Date: 11/20/2017 Fluoroscopy was utilized by the requesting physician.  No radiographic interpretation.    Anti-infectives: Anti-infectives (From admission, onward)   Start     Dose/Rate Route Frequency Ordered Stop   11/20/17 1800  Ampicillin-Sulbactam (UNASYN) 3 g in sodium chloride 0.9 % 100 mL IVPB     3 g 200 mL/hr over 30 Minutes Intravenous Every 6 hours 11/20/17 1715        Assessment/Plan: s/p Procedure(s): LAPAROSCOPIC CHOLECYSTECTOMY WITH INTRAOPERATIVE CHOLANGIOGRAM   Status post laparoscopic cholecystectomy and cholangiography.  Patient doing very well at this point LFTs are improved with a normal lipase of 68.  Will advance diet and discharge later this morning. Florene Glen, MD, FACS  11/22/2017

## 2017-11-22 NOTE — Discharge Summary (Signed)
[place] [facility] [place] NAME: Rhonda Reeves    MR#:  [id number]  DATE OF BIRTH:  22-Nov-1955  DATE OF ADMISSION:  11/18/2017 ADMITTING PHYSICIAN: Hillary Bow, MD  DATE OF DISCHARGE: 11/22/2017  PRIMARY CARE PHYSICIAN: Guadalupe Maple, MD    ADMISSION DIAGNOSIS:  RUQ pain [R10.11] Calculus of bile duct without cholecystitis with obstruction [K80.51]  DISCHARGE DIAGNOSIS:  Active Problems:   Choledocholithiasis   SECONDARY DIAGNOSIS:   Past Medical History:  Diagnosis Date  . Depression   . Osteoporosis     HOSPITAL COURSE:   62 year old female with history of depression who presented to the ER due to abdominal pain.  1.  Choledocholithiasis: Patient is status post ERCP and postoperative day #1 cholecystectomy and doing well. She will outpatient follow-up with surgery in 2 weeks.    2.  Transaminitis due to problem #1 which has improved.  3.  Depression: Continue Prozac and Lamictal     DISCHARGE CONDITIONS AND DIET:   Stable for discharge on regular diet  CONSULTS OBTAINED:  Treatment Team:  Jules Husbands, MD  DRUG ALLERGIES:   Allergies  Allergen Reactions  . Sulfa Antibiotics     DISCHARGE MEDICATIONS:   Allergies as of 11/22/2017      Reactions   Sulfa Antibiotics       Medication List    TAKE these medications   FLUoxetine 10 MG capsule Commonly known as:  PROZAC Take 1 capsule (10 mg total) by mouth daily.   lamoTRIgine 200 MG tablet Commonly known as:  LAMICTAL Take 1 tablet by mouth  daily   OLANZapine 2.5 MG tablet Commonly known as:  ZYPREXA Take 1 tablet (2.5 mg total) by mouth at bedtime.   traMADol 50 MG tablet Commonly known as:  ULTRAM Take 1 tablet (50 mg total) by mouth every 6 (six) hours as needed.         Today   CHIEF COMPLAINT:   Some mild abdominal pain.  Patient is passing gas.  Patient is tolerating diet.   VITAL SIGNS:  Blood pressure 114/61, pulse 78,  temperature 98.3 F (36.8 C), temperature source Oral, resp. rate 16, height 5\' 2"  (1.575 m), weight 121 lb 8 oz (55.1 kg), SpO2 95 %.   REVIEW OF SYSTEMS:  Review of Systems  Constitutional: Negative.  Negative for chills, fever and malaise/fatigue.  HENT: Negative.  Negative for ear discharge, ear pain, hearing loss, nosebleeds and sore throat.   Eyes: Negative.  Negative for blurred vision and pain.  Respiratory: Negative.  Negative for cough, hemoptysis, shortness of breath and wheezing.   Cardiovascular: Negative.  Negative for chest pain, palpitations and leg swelling.  Gastrointestinal: Positive for abdominal pain. Negative for blood in stool, diarrhea, nausea and vomiting.  Genitourinary: Negative.  Negative for dysuria.  Musculoskeletal: Negative.  Negative for back pain.  Skin: Negative.   Neurological: Negative for dizziness, tremors, speech change, focal weakness, seizures and headaches.  Endo/Heme/Allergies: Negative.  Does not bruise/bleed easily.  Psychiatric/Behavioral: Negative.  Negative for depression, hallucinations and suicidal ideas.     PHYSICAL EXAMINATION:  GENERAL:  62 y.o.-year-old patient lying in the bed with no acute distress.  NECK:  Supple, no jugular venous distention. No thyroid enlargement, no tenderness.  LUNGS: Normal breath sounds bilaterally, no wheezing, rales,rhonchi  No use of accessory muscles of respiration.  CARDIOVASCULAR: S1, S2 normal. No murmurs, rubs, or gallops.  ABDOMEN: some mild generalized tenderness bandage in  place.  Good bowel sounds.   EXTREMITIES: No pedal edema, cyanosis, or clubbing.  PSYCHIATRIC: The patient is alert and oriented x 3.  SKIN: No obvious rash, lesion, or ulcer.   DATA REVIEW:   CBC Recent Labs  Lab 11/22/17 0418  WBC 6.4  HGB 13.1  HCT 37.4  PLT 249    Chemistries  Recent Labs  Lab 11/22/17 0418  NA 142  K 3.7  CL 108  CO2 26  GLUCOSE 102*  BUN 8  CREATININE 0.64  CALCIUM 8.6*  AST  61*  ALT 166*  ALKPHOS 197*  BILITOT 1.0    Cardiac Enzymes No results for input(s): TROPONINI in the last 168 hours.  Microbiology Results  @[email or web address] @  RADIOLOGY:  Dg Cholangiogram Operative  Result Date: 11/21/2017 CLINICAL DATA:  Intraoperative cholangiogram during laparoscopic cholecystectomy. EXAM: INTRAOPERATIVE CHOLANGIOGRAM FLUOROSCOPY TIME:  27 seconds COMPARISON:  Right upper quadrant abdominal ultrasound - 11/18/2017 FINDINGS: Intraoperative cholangiographic images of the right upper abdominal quadrant during laparoscopic cholecystectomy are provided for review. Surgical clips overlie the expected location of the gallbladder fossa. Contrast injection demonstrates selective cannulation of the central aspect of the cystic duct. There is passage of contrast through the central aspect of the cystic duct with filling of a moderately dilated common bile duct. There is eventual passage of contrast though the CBD and into the descending portion of the duodenum. There is reflux of injected contrast into the common hepatic duct and central aspect of a mildly dilated intrahepatic biliary system. There are no discrete filling defects within the opacified portions of the biliary system to suggest the presence of choledocholithiasis. IMPRESSION: No definite evidence of occlusive choledocholithiasis, however there was delayed passage of contrast through the distal aspect of CBD, note is made of mild dilatation of the intrahepatic biliary system and choledocholithiasis was suspected on preceding right upper quadrant abdominal ultrasound. Correlation with the operative report is advised. Further evaluation with ERCP could be performed as indicated. Electronically Signed   By: Sandi Mariscal M.D.   On: 11/21/2017 12:01   Dg C-arm 1-60 Min-no Report  Result Date: 11/20/2017 Fluoroscopy was utilized by the requesting physician.  No radiographic interpretation.      Allergies as of 11/22/2017       Reactions   Sulfa Antibiotics       Medication List    TAKE these medications   FLUoxetine 10 MG capsule Commonly known as:  PROZAC Take 1 capsule (10 mg total) by mouth daily.   lamoTRIgine 200 MG tablet Commonly known as:  LAMICTAL Take 1 tablet by mouth  daily   OLANZapine 2.5 MG tablet Commonly known as:  ZYPREXA Take 1 tablet (2.5 mg total) by mouth at bedtime.   traMADol 50 MG tablet Commonly known as:  ULTRAM Take 1 tablet (50 mg total) by mouth every 6 (six) hours as needed.        Management plans discussed with the patient and she is in agreement. Stable for discharge home  Patient should follow up with pcp  CODE STATUS:     Code Status Orders  (From admission, onward)        Start     Ordered   11/18/17 1354  Full code  Continuous     11/18/17 1354    Code Status History    This patient has a current code status but no historical code status.    Advance Directive Documentation     Most Recent Value  Type of  Advance Directive  Living will, Healthcare Power of Attorney  Pre-existing out of facility DNR order (yellow form or pink MOST form)  -  "MOST" Form in Place?  -      TOTAL TIME TAKING CARE OF THIS PATIENT: 38 minutes.    Note: This dictation was prepared with Dragon dictation along with smaller phrase technology. Any transcriptional errors that result from this process are unintentional.  Rosamond Andress M.D on 11/22/2017 at 7:49 AM  Between 7am to 6pm - Pager - [phone] After 6pm go to [email or web address] - password [email or web address] [email or web address]  Office  [phone]  CC: Primary care physician; Guadalupe Maple, MD

## 2017-11-22 NOTE — Progress Notes (Signed)
Diet advance to full liquid diet patient tolerated .

## 2017-11-22 NOTE — Progress Notes (Signed)
Patient is being discharge home today, discharge instruction provided, iv removed , all medication and follow up appointment reviewed patient as well.

## 2017-11-23 ENCOUNTER — Telehealth: Payer: Self-pay

## 2017-11-23 ENCOUNTER — Telehealth: Payer: Self-pay | Admitting: Surgery

## 2017-11-23 ENCOUNTER — Encounter: Payer: Self-pay | Admitting: Gastroenterology

## 2017-11-23 NOTE — Telephone Encounter (Signed)
Transition Care Management Follow-up Telephone Call  How have you been since you were released from the hospital? "I am feeling okay today, just a little sore. I took advil last night and that helped"  Do you understand why you were in the hospital? yes  Do you have a copy of your discharge instructions Yes Do you understand the discharge instrcutions? yes  Where were you discharged to? Home  Do you have support at home? Yes    Items Reviewed:  Medications obtained No, states she doesn't want to use tramadol unless she really needs it, she is taking advil as needed for pain  Medications reviewed: Yes  Dietary changes reviewed: yes  Home Health? N/A  DME ordered at discharge obtained? NA  Medical supplies: NA    Functional Questionnaire:   Activities of Daily Living (ADLs):   She states they are independent in the following: ambulation, bathing and hygiene, feeding, continence, grooming, toileting, dressing and medication management States they require assistance with the following: none  Any transportation issues/concerns?: no  Any patient concerns? no  Confirmed importance and date/time of follow-up visits scheduled with PCP: yes, declined appt with pcp. She will call If she needs to come in to pcp office.   Confirm appointment scheduled with specialist? Yes, sees Dr.Cooper on Monday 11/30/2017  Confirmed with patient if condition begins to worsen call PCP or If it's emergency go to the ER.

## 2017-11-23 NOTE — Telephone Encounter (Signed)
Patient has questions about incision site and removing tape. Please advise

## 2017-11-24 LAB — SURGICAL PATHOLOGY

## 2017-11-24 NOTE — Telephone Encounter (Signed)
Left message for patient to return call.

## 2017-11-24 NOTE — Anesthesia Postprocedure Evaluation (Signed)
Anesthesia Post Note  Patient: Rhonda Reeves  Procedure(s) Performed: ENDOSCOPIC RETROGRADE CHOLANGIOPANCREATOGRAPHY (ERCP) WITH PROPOFOL (N/A )  Patient location during evaluation: Endoscopy Anesthesia Type: General Level of consciousness: awake and alert Pain management: pain level controlled Vital Signs Assessment: post-procedure vital signs reviewed and stable Respiratory status: spontaneous breathing, nonlabored ventilation and respiratory function stable Cardiovascular status: blood pressure returned to baseline and stable Postop Assessment: no apparent nausea or vomiting Anesthetic complications: no     Last Vitals:  Vitals:   11/21/17 2123 11/22/17 0503  BP: 118/66 114/61  Pulse: 84 78  Resp: 16 16  Temp: 36.6 C 36.8 C  SpO2: 96% 95%    Last Pain:  Vitals:   11/22/17 0503  TempSrc: Oral  PainSc:                  Alphonsus Sias

## 2017-11-24 NOTE — Telephone Encounter (Signed)
Spoke with husband and he removed the dressing but isn't sure what he should do with the steri strips. He was instructed to leave them in place and that they would eventually fall off. He was told patient could shower however do not scrub over the strips, but to pat them dry after showering.   He stated patient was doing well, moving about slowly. Patient has post op appointment 11/30/17.

## 2017-11-25 ENCOUNTER — Telehealth: Payer: Self-pay

## 2017-11-25 NOTE — Telephone Encounter (Signed)
Flagged on EMMI report for having unfilled prescriptions and not being able to fill them today or tomorrow.  Called and spoke with Saralyn Pilar, patient's husband. Patient states they do not believe in taking anything other than over the counter pain medications, citing the opioid epidemic. Reports patient is still a little weak, but is doing better.  Has some soreness at incision site, but otherwise doing well.  No other questions or concerns at this time. I thanked him for his time and informed him that they would receive one more automated call checking on her in the next few days.

## 2017-11-30 ENCOUNTER — Encounter: Payer: Self-pay | Admitting: Surgery

## 2017-11-30 ENCOUNTER — Ambulatory Visit (INDEPENDENT_AMBULATORY_CARE_PROVIDER_SITE_OTHER): Payer: PRIVATE HEALTH INSURANCE | Admitting: Surgery

## 2017-11-30 VITALS — BP 129/85 | HR 69 | Resp 12 | Ht 65.0 in | Wt 125.0 lb

## 2017-11-30 DIAGNOSIS — K8050 Calculus of bile duct without cholangitis or cholecystitis without obstruction: Secondary | ICD-10-CM

## 2017-11-30 NOTE — Progress Notes (Signed)
Outpatient postop visit  11/30/2017  Rhonda Reeves is an 62 y.o. female.    Procedure: Lap chole with grams  CC: No problems  HPI: Patient status post laparoscopic cholecystectomy with cholangiography.  Medications reviewed.    Physical Exam:  BP 129/85   Pulse 69   Resp 12   Ht 5\' 5"  (1.651 m)   Wt 125 lb (56.7 kg)   BMI 20.80 kg/m     PE: No icterus no jaundice Abdominal exams without erythema there is minimal ecchymosis.  No drainage    Assessment/Plan:  Pathology reviewed She doing very well recommend follow-up on an as-needed basis.  Florene Glen, MD, FACS

## 2017-11-30 NOTE — Patient Instructions (Signed)

## 2018-01-20 ENCOUNTER — Ambulatory Visit
Admission: RE | Admit: 2018-01-20 | Discharge: 2018-01-20 | Disposition: A | Discharge status: Home / Self Care | Payer: 59 | Source: Ambulatory Visit | Attending: Family Medicine | Admitting: Family Medicine

## 2018-01-20 ENCOUNTER — Encounter: Payer: Self-pay | Admitting: Family Medicine

## 2018-01-20 ENCOUNTER — Ambulatory Visit (INDEPENDENT_AMBULATORY_CARE_PROVIDER_SITE_OTHER): Payer: 59 | Admitting: Family Medicine

## 2018-01-20 VITALS — BP 112/69 | HR 75 | Temp 98.2°F | Ht 65.0 in | Wt 130.5 lb

## 2018-01-20 DIAGNOSIS — F3341 Major depressive disorder, recurrent, in partial remission: Secondary | ICD-10-CM | POA: Diagnosis not present

## 2018-01-20 DIAGNOSIS — Z1211 Encounter for screening for malignant neoplasm of colon: Secondary | ICD-10-CM

## 2018-01-20 DIAGNOSIS — F419 Anxiety disorder, unspecified: Secondary | ICD-10-CM

## 2018-01-20 DIAGNOSIS — Z1239 Encounter for other screening for malignant neoplasm of breast: Secondary | ICD-10-CM | POA: Insufficient documentation

## 2018-01-20 MED ORDER — FLUOXETINE HCL 10 MG PO CAPS: 10.0000 mg | ORAL_CAPSULE | Freq: Every day | ORAL | 3 refills | Status: DC

## 2018-01-20 MED ORDER — LAMOTRIGINE 200 MG PO TABS: ORAL_TABLET | ORAL | 3 refills | Status: DC

## 2018-01-20 MED ORDER — OLANZAPINE 2.5 MG PO TABS: 2.5000 mg | ORAL_TABLET | Freq: Every day | ORAL | 3 refills | Status: DC

## 2018-01-20 NOTE — Addendum Note (Signed)
Addended by: Golden Pop A on: 01/20/2018 04:42 PM   Modules accepted: Orders

## 2018-01-20 NOTE — Assessment & Plan Note (Signed)
The current medical regimen is effective;  continue present plan and medications.  

## 2018-01-20 NOTE — Progress Notes (Signed)
Depression screen [facility] 2/9 01/20/2018 03/31/2017 01/30/2016  Decreased Interest 0 0 0  Down, Depressed, Hopeless 0 0 0  PHQ - 2 Score 0 0 0  Altered sleeping 1 - -  Tired, decreased energy 1 - -  Change in appetite 0 - -  Feeling bad or failure about yourself  0 - -  Trouble concentrating 0 - -  Moving slowly or fidgety/restless 0 - -  Suicidal thoughts 0 - -  PHQ-9 Score 2 - -

## 2018-01-20 NOTE — Progress Notes (Signed)
BP 112/69 (BP Location: Left Arm, Patient Position: Sitting, Cuff Size: Normal)   Pulse 75   Temp 98.2 F (36.8 C) (Oral)   Ht 5\' 5"  (1.651 m)   Wt 130 lb 8 oz (59.2 kg)   SpO2 98%   BMI 21.72 kg/m    Subjective:    Patient ID: Rhonda Reeves, female    DOB: 1956/01/16, 62 y.o.   MRN: [id number]  HPI: Rhonda Reeves is a 62 y.o. female  Chief Complaint  Patient presents with  . Depression  . Anxiety  . Medication Management  Patient follow-up all in all doing very well no complaints from her medications and depression anxiety doing well work is going well for patient. Reviewed gallbladder with patient and gallbladder has recovered has some issues she wanted to discuss about her pre-emergency room care via the phone service.  This was reviewed with the patient.  Relevant past medical, surgical, family and social history reviewed and updated as indicated. Interim medical history since our last visit reviewed. Allergies and medications reviewed and updated.  Review of Systems  Constitutional: Negative.   Respiratory: Negative.   Cardiovascular: Negative.     Per HPI unless specifically indicated above     Objective:    BP 112/69 (BP Location: Left Arm, Patient Position: Sitting, Cuff Size: Normal)   Pulse 75   Temp 98.2 F (36.8 C) (Oral)   Ht 5\' 5"  (1.651 m)   Wt 130 lb 8 oz (59.2 kg)   SpO2 98%   BMI 21.72 kg/m   Wt Readings from Last 3 Encounters:  01/20/18 130 lb 8 oz (59.2 kg)  11/30/17 125 lb (56.7 kg)  11/22/17 121 lb 8 oz (55.1 kg)    Physical Exam  Constitutional: She is oriented to person, place, and time. She appears well-developed and well-nourished.  HENT:  Head: Normocephalic and atraumatic.  Eyes: Conjunctivae and EOM are normal.  Neck: Normal range of motion.  Cardiovascular: Normal rate, regular rhythm and normal heart sounds.  Pulmonary/Chest: Effort normal and breath sounds normal.  Musculoskeletal: Normal range of motion.    Neurological: She is alert and oriented to person, place, and time.  Skin: No erythema.  Psychiatric: She has a normal mood and affect. Her behavior is normal. Judgment and thought content normal.    Results for orders placed or performed during the hospital encounter of 11/18/17  Surgical PCR screen  Result Value Ref Range   MRSA, PCR NEGATIVE NEGATIVE   Staphylococcus aureus NEGATIVE NEGATIVE  Lipase, blood  Result Value Ref Range   Lipase 49 11 - 51 U/L  Comprehensive metabolic panel  Result Value Ref Range   Sodium 138 135 - 145 mmol/L   Potassium 3.7 3.5 - 5.1 mmol/L   Chloride 105 98 - 111 mmol/L   CO2 24 22 - 32 mmol/L   Glucose, Bld 137 (H) 70 - 99 mg/dL   BUN 12 8 - 23 mg/dL   Creatinine, Ser 0.69 0.44 - 1.00 mg/dL   Calcium 9.2 8.9 - 10.3 mg/dL   Total Protein 7.2 6.5 - 8.1 g/dL   Albumin 4.4 3.5 - 5.0 g/dL   AST 425 (H) 15 - 41 U/L   ALT 417 (H) 0 - 44 U/L   Alkaline Phosphatase 206 (H) 38 - 126 U/L   Total Bilirubin 2.0 (H) 0.3 - 1.2 mg/dL   GFR calc non Af Amer >60 >60 mL/min   GFR calc Af Amer >60 >60 mL/min  Anion gap 9 5 - 15  CBC  Result Value Ref Range   WBC 7.0 3.6 - 11.0 K/uL   RBC 4.79 3.80 - 5.20 MIL/uL   Hemoglobin 15.0 12.0 - 16.0 g/dL   HCT 42.8 35.0 - 47.0 %   MCV 89.4 80.0 - 100.0 fL   MCH 31.2 26.0 - 34.0 pg   MCHC 34.9 32.0 - 36.0 g/dL   RDW 12.7 11.5 - 14.5 %   Platelets 276 150 - 440 K/uL  Urinalysis, Complete w Microscopic  Result Value Ref Range   Color, Urine YELLOW (A) YELLOW   APPearance CLEAR (A) CLEAR   Specific Gravity, Urine 1.001 (L) 1.005 - 1.030   pH 7.0 5.0 - 8.0   Glucose, UA NEGATIVE NEGATIVE mg/dL   Hgb urine dipstick NEGATIVE NEGATIVE   Bilirubin Urine NEGATIVE NEGATIVE   Ketones, ur NEGATIVE NEGATIVE mg/dL   Protein, ur NEGATIVE NEGATIVE mg/dL   Nitrite NEGATIVE NEGATIVE   Leukocytes, UA NEGATIVE NEGATIVE   RBC / HPF 0-5 0 - 5 RBC/hpf   WBC, UA NONE SEEN 0 - 5 WBC/hpf   Bacteria, UA NONE SEEN NONE SEEN    Squamous Epithelial / LPF NONE SEEN 0 - 5  Comprehensive metabolic panel  Result Value Ref Range   Sodium 143 135 - 145 mmol/L   Potassium 3.6 3.5 - 5.1 mmol/L   Chloride 110 98 - 111 mmol/L   CO2 27 22 - 32 mmol/L   Glucose, Bld 108 (H) 70 - 99 mg/dL   BUN 12 8 - 23 mg/dL   Creatinine, Ser 0.83 0.44 - 1.00 mg/dL   Calcium 8.3 (L) 8.9 - 10.3 mg/dL   Total Protein 6.2 (L) 6.5 - 8.1 g/dL   Albumin 3.7 3.5 - 5.0 g/dL   AST 270 (H) 15 - 41 U/L   ALT 386 (H) 0 - 44 U/L   Alkaline Phosphatase 188 (H) 38 - 126 U/L   Total Bilirubin 3.1 (H) 0.3 - 1.2 mg/dL   GFR calc non Af Amer >60 >60 mL/min   GFR calc Af Amer >60 >60 mL/min   Anion gap 6 5 - 15  Comprehensive metabolic panel  Result Value Ref Range   Sodium 143 135 - 145 mmol/L   Potassium 3.8 3.5 - 5.1 mmol/L   Chloride 110 98 - 111 mmol/L   CO2 27 22 - 32 mmol/L   Glucose, Bld 93 70 - 99 mg/dL   BUN 8 8 - 23 mg/dL   Creatinine, Ser 0.62 0.44 - 1.00 mg/dL   Calcium 8.4 (L) 8.9 - 10.3 mg/dL   Total Protein 5.7 (L) 6.5 - 8.1 g/dL   Albumin 3.3 (L) 3.5 - 5.0 g/dL   AST 93 (H) 15 - 41 U/L   ALT 241 (H) 0 - 44 U/L   Alkaline Phosphatase 169 (H) 38 - 126 U/L   Total Bilirubin 1.1 0.3 - 1.2 mg/dL   GFR calc non Af Amer >60 >60 mL/min   GFR calc Af Amer >60 >60 mL/min   Anion gap 6 5 - 15  CBC with Differential/Platelet  Result Value Ref Range   WBC 5.8 3.6 - 11.0 K/uL   RBC 4.36 3.80 - 5.20 MIL/uL   Hemoglobin 13.8 12.0 - 16.0 g/dL   HCT 39.1 35.0 - 47.0 %   MCV 89.7 80.0 - 100.0 fL   MCH 31.7 26.0 - 34.0 pg   MCHC 35.3 32.0 - 36.0 g/dL   RDW 12.5 11.5 -  14.5 %   Platelets 227 150 - 440 K/uL   Neutrophils Relative % 67 %   Neutro Abs 3.8 1.4 - 6.5 K/uL   Lymphocytes Relative 22 %   Lymphs Abs 1.3 1.0 - 3.6 K/uL   Monocytes Relative 8 %   Monocytes Absolute 0.4 0.2 - 0.9 K/uL   Eosinophils Relative 3 %   Eosinophils Absolute 0.2 0 - 0.7 K/uL   Basophils Relative 0 %   Basophils Absolute 0.0 0 - 0.1 K/uL  Comprehensive  metabolic panel  Result Value Ref Range   Sodium 144 135 - 145 mmol/L   Potassium 3.6 3.5 - 5.1 mmol/L   Chloride 111 98 - 111 mmol/L   CO2 27 22 - 32 mmol/L   Glucose, Bld 85 70 - 99 mg/dL   BUN 7 (L) 8 - 23 mg/dL   Creatinine, Ser 0.59 0.44 - 1.00 mg/dL   Calcium 8.8 (L) 8.9 - 10.3 mg/dL   Total Protein 6.5 6.5 - 8.1 g/dL   Albumin 3.5 3.5 - 5.0 g/dL   AST 96 (H) 15 - 41 U/L   ALT 200 (H) 0 - 44 U/L   Alkaline Phosphatase 210 (H) 38 - 126 U/L   Total Bilirubin 2.9 (H) 0.3 - 1.2 mg/dL   GFR calc non Af Amer >60 >60 mL/min   GFR calc Af Amer >60 >60 mL/min   Anion gap 6 5 - 15  Lipase, blood  Result Value Ref Range   Lipase 153 (H) 11 - 51 U/L  CBC with Differential/Platelet  Result Value Ref Range   WBC 6.4 3.6 - 11.0 K/uL   RBC 4.15 3.80 - 5.20 MIL/uL   Hemoglobin 13.1 12.0 - 16.0 g/dL   HCT 37.4 35.0 - 47.0 %   MCV 90.1 80.0 - 100.0 fL   MCH 31.6 26.0 - 34.0 pg   MCHC 35.1 32.0 - 36.0 g/dL   RDW 12.5 11.5 - 14.5 %   Platelets 249 150 - 440 K/uL   Neutrophils Relative % 78 %   Neutro Abs 5.0 1.4 - 6.5 K/uL   Lymphocytes Relative 16 %   Lymphs Abs 1.0 1.0 - 3.6 K/uL   Monocytes Relative 6 %   Monocytes Absolute 0.4 0.2 - 0.9 K/uL   Eosinophils Relative 0 %   Eosinophils Absolute 0.0 0 - 0.7 K/uL   Basophils Relative 0 %   Basophils Absolute 0.0 0 - 0.1 K/uL  Comprehensive metabolic panel  Result Value Ref Range   Sodium 142 135 - 145 mmol/L   Potassium 3.7 3.5 - 5.1 mmol/L   Chloride 108 98 - 111 mmol/L   CO2 26 22 - 32 mmol/L   Glucose, Bld 102 (H) 70 - 99 mg/dL   BUN 8 8 - 23 mg/dL   Creatinine, Ser 0.64 0.44 - 1.00 mg/dL   Calcium 8.6 (L) 8.9 - 10.3 mg/dL   Total Protein 6.3 (L) 6.5 - 8.1 g/dL   Albumin 3.6 3.5 - 5.0 g/dL   AST 61 (H) 15 - 41 U/L   ALT 166 (H) 0 - 44 U/L   Alkaline Phosphatase 197 (H) 38 - 126 U/L   Total Bilirubin 1.0 0.3 - 1.2 mg/dL   GFR calc non Af Amer >60 >60 mL/min   GFR calc Af Amer >60 >60 mL/min   Anion gap 8 5 - 15  Lipase,  blood  Result Value Ref Range   Lipase 68 (H) 11 - 51 U/L  Surgical pathology  Result Value Ref Range   SURGICAL PATHOLOGY      Surgical Pathology CASE: [id number] PATIENT: [facility] Surgical Pathology Report     SPECIMEN SUBMITTED: A. Gallbladder  CLINICAL HISTORY: None provided  PRE-OPERATIVE DIAGNOSIS: Choledocholithiasis  POST-OPERATIVE DIAGNOSIS: None provided.     DIAGNOSIS: A. GALLBLADDER; CHOLECYSTECTOMY: - CHOLELITHIASIS AND CHRONIC CHOLECYSTITIS. - NEGATIVE FOR DYSPLASIA AND MALIGNANCY.   GROSS DESCRIPTION: A. Labeled: Gallbladder Received: In formalin Size of specimen: 12.1 x 4.2 x 3.6 cm Previously opened: No External surface: Predominantly smooth blue to green with focal fibrous adhesions Wall thickness: 0.4 cm Mucosa: Velvety green with diffuse yellow stippling Stones present: Multiple multifaceted brown to green, aggregate 10.3 x 7.5 x 1.1 cm Other findings: Cystic duct margin inked blue  Block summary: 1 - en face cystic duct margin and representative section of the wall   Final Diagnosis performed by Bryan Lemma, MD.   Electronically signed 8 /09/2017 7:51:33PM The electronic signature indicates that the named Attending Pathologist has evaluated the specimen  Technical component performed at [facility], [address] [place], [place] [address] Lab: [phone] Dir: Rush Farmer, MD, MMM  Professional component performed at [facility], [facility], [place], [place], [place] [address] Lab: [phone] Dir: Dellia Nims. Reuel Derby, MD       Assessment & Plan:   Problem List Items Addressed This Visit      Other   Depression    The current medical regimen is effective;  continue present plan and medications.       Relevant Medications   lamoTRIgine (LAMICTAL) 200 MG tablet   FLUoxetine (PROZAC) 10 MG capsule   OLANZapine (ZYPREXA) 2.5 MG tablet   Anxiety    The current medical regimen is effective;   continue present plan and medications.       Relevant Medications   FLUoxetine (PROZAC) 10 MG capsule       Follow up plan: Return in about 6 months (around 07/22/2018) for Physical Exam.

## 2018-01-20 NOTE — Progress Notes (Deleted)
Depression screen [facility] 2/9 01/20/2018 03/31/2017 01/30/2016  Decreased Interest 0 0 0  Down, Depressed, Hopeless 0 0 0  PHQ - 2 Score 0 0 0  Altered sleeping 1 - -  Tired, decreased energy 1 - -  Change in appetite 0 - -  Feeling bad or failure about yourself  0 - -  Trouble concentrating 0 - -  Moving slowly or fidgety/restless 0 - -  Suicidal thoughts 0 - -  PHQ-9 Score 2 - -

## 2018-01-21 ENCOUNTER — Other Ambulatory Visit: Payer: Self-pay | Admitting: Family Medicine

## 2018-01-21 DIAGNOSIS — R928 Other abnormal and inconclusive findings on diagnostic imaging of breast: Secondary | ICD-10-CM

## 2018-02-01 ENCOUNTER — Encounter (INDEPENDENT_AMBULATORY_CARE_PROVIDER_SITE_OTHER): Payer: PRIVATE HEALTH INSURANCE | Admitting: Ophthalmology

## 2018-02-04 ENCOUNTER — Encounter (INDEPENDENT_AMBULATORY_CARE_PROVIDER_SITE_OTHER): Payer: PRIVATE HEALTH INSURANCE | Admitting: Ophthalmology

## 2018-02-17 ENCOUNTER — Other Ambulatory Visit: Payer: 59

## 2018-02-17 ENCOUNTER — Ambulatory Visit: Payer: 59

## 2018-02-19 ENCOUNTER — Ambulatory Visit
Admission: RE | Admit: 2018-02-19 | Discharge: 2018-02-19 | Disposition: A | Discharge status: Home / Self Care | Payer: 59 | Source: Ambulatory Visit | Attending: Family Medicine | Admitting: Family Medicine

## 2018-02-19 DIAGNOSIS — R928 Other abnormal and inconclusive findings on diagnostic imaging of breast: Secondary | ICD-10-CM | POA: Diagnosis not present

## 2018-02-22 ENCOUNTER — Other Ambulatory Visit: Payer: Self-pay | Admitting: Family Medicine

## 2018-02-22 DIAGNOSIS — R928 Other abnormal and inconclusive findings on diagnostic imaging of breast: Secondary | ICD-10-CM

## 2018-02-24 ENCOUNTER — Ambulatory Visit
Admission: RE | Admit: 2018-02-24 | Discharge: 2018-02-24 | Disposition: A | Discharge status: Home / Self Care | Payer: 59 | Source: Ambulatory Visit | Attending: Family Medicine | Admitting: Family Medicine

## 2018-02-24 DIAGNOSIS — R928 Other abnormal and inconclusive findings on diagnostic imaging of breast: Secondary | ICD-10-CM | POA: Diagnosis present

## 2018-02-24 HISTORY — PX: BREAST BIOPSY: SHX20

## 2018-02-25 ENCOUNTER — Telehealth: Payer: Self-pay | Admitting: Family Medicine

## 2018-02-25 LAB — SURGICAL PATHOLOGY

## 2018-02-25 NOTE — Telephone Encounter (Signed)
Copied from [place] [id number]. Topic: General - Other >> Feb 25, 2018  4:15 PM Leward Quan A wrote: Reason for CRM: Jetta Lout with [facility] would like a call back regarding patients test results Right breast 3D biopsy not positive for breast cancer but does have another abnormality. Please call  Ph# [phone]

## 2018-02-25 NOTE — Telephone Encounter (Signed)
Rhonda Reeves with [facility] called back and she says that according to the surgical pathology report resulted in the patient's chart, it shows a high risk lesion and recommend a surgical referral. She asks if Dr. Jeananne Rama will call the patient and make the referral or does he want the radiologist to tell the patient the results and nurses there can make the referral. I advised I will call the office to ask, I called and spoke to the [place] who asked Dr. Jeananne Rama the above question. He says for the radiologist to call the patient and make the referral. I advised Rhonda Reeves of his recommendation, she verbalized understanding.

## 2018-03-01 NOTE — Telephone Encounter (Signed)
Pt has appointment with Dr. Celine Ahr on 03/03/18

## 2018-03-03 ENCOUNTER — Other Ambulatory Visit: Payer: Self-pay

## 2018-03-03 ENCOUNTER — Encounter: Payer: Self-pay | Admitting: General Surgery

## 2018-03-03 ENCOUNTER — Ambulatory Visit (INDEPENDENT_AMBULATORY_CARE_PROVIDER_SITE_OTHER): Payer: PRIVATE HEALTH INSURANCE | Admitting: General Surgery

## 2018-03-03 VITALS — BP 131/77 | HR 86 | Temp 97.9°F | Resp 16 | Ht 62.0 in | Wt 132.6 lb

## 2018-03-03 DIAGNOSIS — N6489 Other specified disorders of breast: Secondary | ICD-10-CM | POA: Diagnosis not present

## 2018-03-03 NOTE — H&P (View-Only) (Signed)
Surgical Consultation  03/03/2018  Rhonda Reeves is an 62 y.o. female.   CC: She was referred for surgical evaluation of a right breast lesion.  HPI: [name] underwent routine screening mammogram on 01/20/18. There was an area of architectural distortion in the right breast.  It was not able to be seen on ultrasound, but the same distortion was appreciated on the follow-up diagnostic images.  She underwent stereotactic breast biopsy on 02/24/18.  The pathology returned as follows:  DIAGNOSIS:  A. 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  - NEGATIVE FOR ATYPIA AND MALIGNANCY.   Due to the risk of a hidden or subsequent breast cancer in this type of lesion, she was referred for surgical evaluation. Prior to her mammogram, she had never noticed a palpable lump or mass in her breasts. She performs self breast exams monthly.  No pain or nipple discharge of any kind. She endorses very little soy intake.  GYN History: Menarche at age 89. G0P0, so no breast feeding. Hysterectomy in 2002, after which she took HRT for about 4 months.     Family Breast Cancer History: No known family history of breast cancer, ovarian or endometrial cancer, testicular cancer. Mother and brother have both had skin cancer, but the patient does not know if it was melanoma or not.  Past Medical History:  Diagnosis Date  . Depression   . Osteoporosis     Past Surgical History:  Procedure Laterality Date  . ABDOMINAL HYSTERECTOMY    . BREAST BIOPSY Right 02/24/2018   affirm bx, coil clip, path pending  . CHOLECYSTECTOMY N/A 11/21/2017   Procedure: LAPAROSCOPIC CHOLECYSTECTOMY WITH INTRAOPERATIVE CHOLANGIOGRAM;  Surgeon: Florene Glen, MD;  Location: [facility] ORS;  Service: General;  Laterality: N/A;  . ENDOSCOPIC RETROGRADE CHOLANGIOPANCREATOGRAPHY (ERCP) WITH  PROPOFOL N/A 11/20/2017   Procedure: ENDOSCOPIC RETROGRADE CHOLANGIOPANCREATOGRAPHY (ERCP) WITH PROPOFOL;  Surgeon: Lucilla Lame, MD;  Location: [facility] ENDOSCOPY;  Service: Endoscopy;  Laterality: N/A;  . HEMORRHOID SURGERY      Family History  Problem Relation Age of Onset  . Osteoporosis Mother   . Graves' disease Mother   . Osteoporosis Father   . Hernia Father   . Heart disease Father   . Kidney Stones Father   . Hypotension Father     Social History:  reports that she quit smoking about 13 years ago. She has never used smokeless tobacco. She reports that she drinks about 1.0 standard drinks of alcohol per week. She reports that she does not use drugs.  Allergies:  Allergies  Allergen Reactions  . Sulfa Antibiotics     Medications reviewed.  Current Outpatient Medications:  .  FLUoxetine (PROZAC) 10 MG capsule, Take 1 capsule (10 mg total) by mouth daily., Disp: 90 capsule, Rfl: 3 .  lamoTRIgine (LAMICTAL) 200 MG tablet, Take 1 tablet by mouth  daily, Disp: 90 tablet, Rfl: 3 .  OLANZapine (ZYPREXA) 2.5 MG tablet, Take 1 tablet (2.5 mg total) by mouth at bedtime., Disp: 90 tablet, Rfl: 3  Review of Systems:   Review of Systems  Constitutional: Negative.   HENT: Negative.   Eyes: Negative.   Respiratory: Negative.   Cardiovascular: Negative.   Gastrointestinal: Negative.   Genitourinary: Negative.   Musculoskeletal: Negative.   Skin:       Bruising from recent biopsy  Neurological: Negative.   Endo/Heme/Allergies: Negative.   Psychiatric/Behavioral: Negative.  Physical Exam:  BP 131/77   Pulse 86   Temp 97.9 F (36.6 C) (Temporal)   Resp 16   Ht 5\' 2"  (1.575 m)   Wt 132 lb 9.6 oz (60.1 kg)   SpO2 97%   BMI 24.25 kg/m   Physical Exam  Constitutional: She is oriented to person, place, and time. She appears well-developed and well-nourished. No distress.  HENT:  Head: Normocephalic and atraumatic.  Mouth/Throat: Oropharynx is clear and moist. No  oropharyngeal exudate.  Eyes: Pupils are equal, round, and reactive to light. Right eye exhibits no discharge. Left eye exhibits no discharge. No scleral icterus.  Neck: Normal range of motion. No tracheal deviation present. No thyromegaly present.  Cardiovascular: Normal rate, regular rhythm, normal heart sounds and intact distal pulses.  Pulmonary/Chest: Effort normal and breath sounds normal.  Bruising around areola and at lateral-inferior quadrant.    Abdominal: Soft. Bowel sounds are normal.  Musculoskeletal: She exhibits no edema, tenderness or deformity.  Lymphadenopathy:    She has no cervical adenopathy.  Neurological: She is alert and oriented to person, place, and time.  Skin: Skin is warm and dry.  Skin of right breast bruised from recent biopsy.  Psychiatric: She has a normal mood and affect.      Breast Exam: No palpable breast masses. No nipple discharge. Normal everted nipples. No skin changes.  No axillary or supraclavicular LAD. Breast tissue slightly swollen and tender at the biopsy site.  No palpable mass in the area.    Imaging: CLINICAL DATA:  62 year old patient recalled from recent screening mammogram for evaluation of possible distortion in the right breast. The patient has no history right breast surgery.  EXAM: DIGITAL DIAGNOSTIC RIGHT MAMMOGRAM WITH CAD AND TOMO  ULTRASOUND RIGHT BREAST  COMPARISON:  01/20/2018  ACR Breast Density Category c: The breast tissue is heterogeneously dense, which may obscure small masses.  FINDINGS: Spot compression views of the lower outer right breast with tomography confirm a focal area of architectural distortion seen on slice 10 of 53 in the CC projection and slice 20 of 54 in the MLO projection. A 90 degree lateral view of the right breast is performed and the distortion is not well visualized in this projection.  Mammographic images were processed with CAD.  On physical exam, no mass is palpated in  the outer right breast.  Targeted ultrasound is performed, showing no definite correlate to the architectural distortion. No suspicious mass is identified. Ultrasound of the right axilla is negative for lymphadenopathy.  IMPRESSION: Persistent focal architectural distortion in the lower outer quadrant of the right breast. Considerations include complex sclerosing lesion or malignancy.  RECOMMENDATION: Stereotactic biopsy is recommended. The procedure for biopsy was discussed with the patient today.  I have discussed the findings and recommendations with the patient. Results were also provided in writing at the conclusion of the visit. If applicable, a reminder letter will be sent to the patient regarding the next appointment.  BI-RADS CATEGORY  4: Suspicious.   Electronically Signed   By: Curlene Dolphin M.D.   On: 02/19/2018 14:18   Pathology:  DIAGNOSIS:  A. 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  - NEGATIVE FOR ATYPIA AND MALIGNANCY.    Assessment/Plan: 62 y/o F with low risk for hereditary breast cancer, found to have a complex sclerosing lesion on stereotactic core biopsy.  There is roughly an 8-17%  risk of identifying an in situ or invasive breast cancer in these types of lesions, as well as some evidence that they may be a pre-malignant condition with the possibility of progression to carcinoma over time.  I have recommended that she undergo a wire-localized excisional breast biopsy.  I discussed risks of bleeding, infection, damage to surrounding tissues, having positive margins, needing further resection, seroma, hematoma, need for treatment if cancer is identified; as well as anesthesia risks of MI, stroke, prolonged ventilation, pulmonary embolism, thrombosis and even death.   Patient was given the opportunity to  ask questions and have them answered. She would like to proceed with right breast needle localized lumpectomy.  Fredirick Maudlin

## 2018-03-03 NOTE — Progress Notes (Signed)
Surgical Consultation  03/03/2018  Rhonda Reeves is an 62 y.o. female.   CC: She was referred for surgical evaluation of a right breast lesion.  HPI: Ms Safer underwent routine screening mammogram on 01/20/18. There was an area of architectural distortion in the right breast.  It was not able to be seen on ultrasound, but the same distortion was appreciated on the follow-up diagnostic images.  She underwent stereotactic breast biopsy on 02/24/18.  The pathology returned as follows:  DIAGNOSIS:  A. 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  - NEGATIVE FOR ATYPIA AND MALIGNANCY.   Due to the risk of a hidden or subsequent breast cancer in this type of lesion, she was referred for surgical evaluation. Prior to her mammogram, she had never noticed a palpable lump or mass in her breasts. She performs self breast exams monthly.  No pain or nipple discharge of any kind. She endorses very little soy intake.  GYN History: Menarche at age 25. G0P0, so no breast feeding. Hysterectomy in 2002, after which she took HRT for about 4 months.     Family Breast Cancer History: No known family history of breast cancer, ovarian or endometrial cancer, testicular cancer. Mother and brother have both had skin cancer, but the patient does not know if it was melanoma or not.  Past Medical History:  Diagnosis Date  . Depression   . Osteoporosis     Past Surgical History:  Procedure Laterality Date  . ABDOMINAL HYSTERECTOMY    . BREAST BIOPSY Right 02/24/2018   affirm bx, coil clip, path pending  . CHOLECYSTECTOMY N/A 11/21/2017   Procedure: LAPAROSCOPIC CHOLECYSTECTOMY WITH INTRAOPERATIVE CHOLANGIOGRAM;  Surgeon: Florene Glen, MD;  Location: [facility] ORS;  Service: General;  Laterality: N/A;  . ENDOSCOPIC RETROGRADE CHOLANGIOPANCREATOGRAPHY (ERCP) WITH  PROPOFOL N/A 11/20/2017   Procedure: ENDOSCOPIC RETROGRADE CHOLANGIOPANCREATOGRAPHY (ERCP) WITH PROPOFOL;  Surgeon: Lucilla Lame, MD;  Location: [facility] ENDOSCOPY;  Service: Endoscopy;  Laterality: N/A;  . HEMORRHOID SURGERY      Family History  Problem Relation Age of Onset  . Osteoporosis Mother   . Graves' disease Mother   . Osteoporosis Father   . Hernia Father   . Heart disease Father   . Kidney Stones Father   . Hypotension Father     Social History:  reports that she quit smoking about 13 years ago. She has never used smokeless tobacco. She reports that she drinks about 1.0 standard drinks of alcohol per week. She reports that she does not use drugs.  Allergies:  Allergies  Allergen Reactions  . Sulfa Antibiotics     Medications reviewed.  Current Outpatient Medications:  .  FLUoxetine (PROZAC) 10 MG capsule, Take 1 capsule (10 mg total) by mouth daily., Disp: 90 capsule, Rfl: 3 .  lamoTRIgine (LAMICTAL) 200 MG tablet, Take 1 tablet by mouth  daily, Disp: 90 tablet, Rfl: 3 .  OLANZapine (ZYPREXA) 2.5 MG tablet, Take 1 tablet (2.5 mg total) by mouth at bedtime., Disp: 90 tablet, Rfl: 3  Review of Systems:   Review of Systems  Constitutional: Negative.   HENT: Negative.   Eyes: Negative.   Respiratory: Negative.   Cardiovascular: Negative.   Gastrointestinal: Negative.   Genitourinary: Negative.   Musculoskeletal: Negative.   Skin:       Bruising from recent biopsy  Neurological: Negative.   Endo/Heme/Allergies: Negative.   Psychiatric/Behavioral: Negative.  Physical Exam:  BP 131/77   Pulse 86   Temp 97.9 F (36.6 C) (Temporal)   Resp 16   Ht 5\' 2"  (1.575 m)   Wt 132 lb 9.6 oz (60.1 kg)   SpO2 97%   BMI 24.25 kg/m   Physical Exam  Constitutional: She is oriented to person, place, and time. She appears well-developed and well-nourished. No distress.  HENT:  Head: Normocephalic and atraumatic.  Mouth/Throat: Oropharynx is clear and moist. No  oropharyngeal exudate.  Eyes: Pupils are equal, round, and reactive to light. Right eye exhibits no discharge. Left eye exhibits no discharge. No scleral icterus.  Neck: Normal range of motion. No tracheal deviation present. No thyromegaly present.  Cardiovascular: Normal rate, regular rhythm, normal heart sounds and intact distal pulses.  Pulmonary/Chest: Effort normal and breath sounds normal.  Bruising around areola and at lateral-inferior quadrant.    Abdominal: Soft. Bowel sounds are normal.  Musculoskeletal: She exhibits no edema, tenderness or deformity.  Lymphadenopathy:    She has no cervical adenopathy.  Neurological: She is alert and oriented to person, place, and time.  Skin: Skin is warm and dry.  Skin of right breast bruised from recent biopsy.  Psychiatric: She has a normal mood and affect.      Breast Exam: No palpable breast masses. No nipple discharge. Normal everted nipples. No skin changes.  No axillary or supraclavicular LAD. Breast tissue slightly swollen and tender at the biopsy site.  No palpable mass in the area.    Imaging: CLINICAL DATA:  62 year old patient recalled from recent screening mammogram for evaluation of possible distortion in the right breast. The patient has no history right breast surgery.  EXAM: DIGITAL DIAGNOSTIC RIGHT MAMMOGRAM WITH CAD AND TOMO  ULTRASOUND RIGHT BREAST  COMPARISON:  01/20/2018  ACR Breast Density Category c: The breast tissue is heterogeneously dense, which may obscure small masses.  FINDINGS: Spot compression views of the lower outer right breast with tomography confirm a focal area of architectural distortion seen on slice 10 of 53 in the CC projection and slice 20 of 54 in the MLO projection. A 90 degree lateral view of the right breast is performed and the distortion is not well visualized in this projection.  Mammographic images were processed with CAD.  On physical exam, no mass is palpated in  the outer right breast.  Targeted ultrasound is performed, showing no definite correlate to the architectural distortion. No suspicious mass is identified. Ultrasound of the right axilla is negative for lymphadenopathy.  IMPRESSION: Persistent focal architectural distortion in the lower outer quadrant of the right breast. Considerations include complex sclerosing lesion or malignancy.  RECOMMENDATION: Stereotactic biopsy is recommended. The procedure for biopsy was discussed with the patient today.  I have discussed the findings and recommendations with the patient. Results were also provided in writing at the conclusion of the visit. If applicable, a reminder letter will be sent to the patient regarding the next appointment.  BI-RADS CATEGORY  4: Suspicious.   Electronically Signed   By: Curlene Dolphin M.D.   On: 02/19/2018 14:18   Pathology:  DIAGNOSIS:  A. 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  - NEGATIVE FOR ATYPIA AND MALIGNANCY.    Assessment/Plan: 62 y/o F with low risk for hereditary breast cancer, found to have a complex sclerosing lesion on stereotactic core biopsy.  There is roughly an 8-17%  risk of identifying an in situ or invasive breast cancer in these types of lesions, as well as some evidence that they may be a pre-malignant condition with the possibility of progression to carcinoma over time.  I have recommended that she undergo a wire-localized excisional breast biopsy.  I discussed risks of bleeding, infection, damage to surrounding tissues, having positive margins, needing further resection, seroma, hematoma, need for treatment if cancer is identified; as well as anesthesia risks of MI, stroke, prolonged ventilation, pulmonary embolism, thrombosis and even death.   Patient was given the opportunity to  ask questions and have them answered. She would like to proceed with right breast needle localized lumpectomy.  Fredirick Maudlin

## 2018-03-03 NOTE — Patient Instructions (Addendum)
We have spoken today about removing a lump in your breast. This will be done   by Dr. Fredirick Maudlin at [facility].  You will most likely be able to leave the hospital several hours after your surgery. Rarely, a patient needs to stay over night but this is a possibility.  Plan to tenatively be off work for 1-2 weeks following the surgery and may return with approximately 4 more weeks of a lifting restriction, no greater than 15 lbs.    Lumpectomy A lumpectomy is a form of "breast conserving" or "breast preservation" surgery. It may also be referred to as a partial mastectomy. During a lumpectomy, the portion of the breast that contains the cancerous tumor or breast mass (the lump) is removed. Some normal tissue around the lump may also be removed to make sure all of the tumor has been removed.  LET [facility] CARE PROVIDER KNOW ABOUT:  Any allergies you have.  All medicines you are taking, including vitamins, herbs, eye drops, creams, and over-the-counter medicines.  Previous problems you or members of your family have had with the use of anesthetics.  Any blood disorders you have.  Previous surgeries you have had.  Medical conditions you have. RISKS AND COMPLICATIONS Generally, this is a safe procedure. However, problems can occur and include:  Bleeding.  Infection.  Pain.  Temporary swelling.  Change in the shape of the breast, particularly if a large portion is removed. BEFORE THE PROCEDURE  Ask your health care provider about changing or stopping your regular medicines. This is especially important if you are taking diabetes medicines or blood thinners.  Do not eat or drink anything after midnight on the night before the procedure or as directed by your health care provider. Ask your health care provider if you can take a sip of water with any approved medicines.  On the day of surgery, your health care provider will use a mammogram or ultrasound to locate and mark the tumor  in your breast. These markings on your breast will show where the cut (incision) will be made. PROCEDURE   An IV tube will be put into one of your veins.  You may be given medicine to help you relax before the surgery (sedative). You will be given one of the following:  A medicine that numbs the area (local anesthetic).  A medicine that makes you fall asleep (general anesthetic).  Your health care provider will use a kind of electric scalpel that uses heat to minimize bleeding (electrocautery knife).  A curved incision (like a smile or frown) that follows the natural curve of your breast is made, to allow for minimal scarring and better healing.  The tumor will be removed with some of the surrounding tissue. This will be sent to the lab for analysis. Your health care provider may also remove your lymph nodes at this time if needed.  Sometimes, but not always, a rubber tube called a drain will be surgically inserted into your breast area or armpit to collect excess fluid that may accumulate in the space where the tumor was. This drain is connected to a plastic bulb on the outside of your body. This drain creates suction to help remove the fluid.  The incisions will be closed with stitches (sutures).  A bandage may be placed over the incisions. AFTER THE PROCEDURE  You will be taken to the recovery area.  You will be given medicine for pain.  A small rubber drain may be placed in  the breast for 2-3 days to prevent a collection of blood (hematoma) from developing in the breast. You will be given instructions on caring for the drain before you go home.  A pressure bandage (dressing) will be applied for 1-2 days to prevent bleeding. Ask your health care provider how to care for your bandage at home.   This information is not intended to replace advice given to you by your health care provider. Make sure you discuss any questions you have with your health care provider.   Document  Released: 05/19/2006 Document Revised: 04/28/2014 Document Reviewed: 09/10/2012 Elsevier Interactive Patient Education [employer].

## 2018-03-04 ENCOUNTER — Telehealth: Payer: Self-pay | Admitting: *Deleted

## 2018-03-04 ENCOUNTER — Other Ambulatory Visit: Payer: Self-pay | Admitting: General Surgery

## 2018-03-04 DIAGNOSIS — N6489 Other specified disorders of breast: Secondary | ICD-10-CM

## 2018-03-04 NOTE — Telephone Encounter (Signed)
Patient's surgery has been scheduled for 03-09-18 at [facility] with Dr. Celine Ahr. The patient is aware to arrive at the [facility] at 11:15 am.  The patient is aware to expect a phone call from the [facility] on 03-05-18 between 1 and 5 pm.   The patient is aware to call the office should they have further questions.

## 2018-03-05 ENCOUNTER — Encounter
Admission: RE | Admit: 2018-03-05 | Discharge: 2018-03-05 | Disposition: A | Discharge status: Home / Self Care | Payer: 59 | Source: Ambulatory Visit | Attending: General Surgery | Admitting: General Surgery

## 2018-03-05 ENCOUNTER — Other Ambulatory Visit: Payer: Self-pay

## 2018-03-05 HISTORY — DX: Personal history of urinary calculi: Z87.442

## 2018-03-05 NOTE — Patient Instructions (Signed)
Your procedure is scheduled on: 03-09-18  Report to [facility] @ 11:15 AM  Remember: Instructions that are not followed completely may result in serious medical risk, up to and including death, or upon the discretion of your surgeon and anesthesiologist your surgery may need to be rescheduled.    _x___ 1. Do not eat food after midnight the night before your procedure. NO GUM OR CANDY AFTER MIDNIGHT.  You may drink clear liquids up to 2 hours before you are scheduled to arrive at the hospital for your procedure.  Do not drink clear liquids within 2 hours of your scheduled arrival to the hospital.  Clear liquids include  --Water or Apple juice without pulp  --Clear carbohydrate beverage such as ClearFast or Gatorade  --Black Coffee or Clear Tea (No milk, no creamers, do not add anything to the coffee or Tea   ____Ensure clear carbohydrate drink on the way to the hospital for bariatric patients  ____Ensure clear carbohydrate drink 3 hours before surgery for Dr Dwyane Luo patients if physician instructed.     __x__ 2. No Alcohol for 24 hours before or after surgery.   __x__3. No Smoking or e-cigarettes for 24 prior to surgery.  Do not use any chewable tobacco products for at least 6 hour prior to surgery   ____  4. Bring all medications with you on the day of surgery if instructed.    __x__ 5. Notify your doctor if there is any change in your medical condition     (cold, fever, infections).    x___6. On the morning of surgery brush your teeth with toothpaste and water.  You may rinse your mouth with mouth wash if you wish.  Do not swallow any toothpaste or mouthwash.   Do not wear jewelry, make-up, hairpins, clips or nail polish.  Do not wear lotions, powders, or perfumes. You may wear deodorant.  Do not shave 48 hours prior to surgery. Men may shave face and neck.  Do not bring valuables to the hospital.    [facility] is not responsible for any belongings or valuables.                Contacts, dentures or bridgework may not be worn into surgery.  Leave your suitcase in the car. After surgery it may be brought to your room.  For patients admitted to the hospital, discharge time is determined by your treatment team.  _  Patients discharged the day of surgery will not be allowed to drive home.  You will need someone to drive you home and stay with you the night of your procedure.    Please read over the following fact sheets that you were given:   [facility] Preparing for Surgery  _x___ TAKE THE FOLLOWING MEDICATION THE MORNING OF SURGERY WITH A SMALL SIP OF WATER. These include:  1. PROZAC  2. LAMICTAL  3. ZYPREXA  4.  5.  6.  ____Fleets enema or Magnesium Citrate as directed.   ____ Use CHG Soap or sage wipes as directed on instruction sheet   ____ Use inhalers on the day of surgery and bring to hospital day of surgery  ____ Stop Metformin and Janumet 2 days prior to surgery.    ____ Take 1/2 of usual insulin dose the night before surgery and none on the morning     surgery.   ____ Follow recommendations from Cardiologist, Pulmonologist or PCP regarding stopping Aspirin, Coumadin, Plavix ,Eliquis, Effient, or Pradaxa, and Pletal.  X____Stop Anti-inflammatories  such as Advil, Aleve, Ibuprofen, Motrin, Naproxen, Naprosyn, Goodies powders or aspirin products NOW-OK to take Tylenol    ____ Stop supplements until after surgery.     ____ Bring C-Pap to the hospital.

## 2018-03-08 MED ORDER — CEFAZOLIN SODIUM-DEXTROSE 2-4 GM/100ML-% IV SOLN
2.0000 g | INTRAVENOUS | Status: AC
  Administered 2018-03-09: 2 g via INTRAVENOUS

## 2018-03-09 ENCOUNTER — Ambulatory Visit
Admission: RE | Admit: 2018-03-09 | Discharge: 2018-03-09 | Disposition: A | Discharge status: Home / Self Care | Payer: 59 | Source: Ambulatory Visit | Attending: General Surgery | Admitting: General Surgery

## 2018-03-09 ENCOUNTER — Ambulatory Visit: Payer: 59 | Admitting: Certified Registered Nurse Anesthetist

## 2018-03-09 ENCOUNTER — Encounter: Payer: Self-pay | Admitting: *Deleted

## 2018-03-09 ENCOUNTER — Encounter
Admission: RE | Disposition: A | Discharge status: Home / Self Care | Payer: Self-pay | Source: Ambulatory Visit | Attending: General Surgery

## 2018-03-09 DIAGNOSIS — N6489 Other specified disorders of breast: Secondary | ICD-10-CM | POA: Diagnosis not present

## 2018-03-09 DIAGNOSIS — N6310 Unspecified lump in the right breast, unspecified quadrant: Secondary | ICD-10-CM | POA: Diagnosis not present

## 2018-03-09 DIAGNOSIS — Z87891 Personal history of nicotine dependence: Secondary | ICD-10-CM | POA: Diagnosis not present

## 2018-03-09 DIAGNOSIS — L905 Scar conditions and fibrosis of skin: Secondary | ICD-10-CM | POA: Insufficient documentation

## 2018-03-09 DIAGNOSIS — F329 Major depressive disorder, single episode, unspecified: Secondary | ICD-10-CM | POA: Diagnosis not present

## 2018-03-09 DIAGNOSIS — N62 Hypertrophy of breast: Secondary | ICD-10-CM | POA: Insufficient documentation

## 2018-03-09 DIAGNOSIS — Z90710 Acquired absence of both cervix and uterus: Secondary | ICD-10-CM | POA: Insufficient documentation

## 2018-03-09 DIAGNOSIS — Z882 Allergy status to sulfonamides status: Secondary | ICD-10-CM | POA: Diagnosis not present

## 2018-03-09 DIAGNOSIS — Z8249 Family history of ischemic heart disease and other diseases of the circulatory system: Secondary | ICD-10-CM | POA: Diagnosis not present

## 2018-03-09 DIAGNOSIS — M810 Age-related osteoporosis without current pathological fracture: Secondary | ICD-10-CM | POA: Insufficient documentation

## 2018-03-09 DIAGNOSIS — Z8262 Family history of osteoporosis: Secondary | ICD-10-CM | POA: Diagnosis not present

## 2018-03-09 DIAGNOSIS — Z9049 Acquired absence of other specified parts of digestive tract: Secondary | ICD-10-CM | POA: Diagnosis not present

## 2018-03-09 DIAGNOSIS — Z8349 Family history of other endocrine, nutritional and metabolic diseases: Secondary | ICD-10-CM | POA: Insufficient documentation

## 2018-03-09 DIAGNOSIS — Z79899 Other long term (current) drug therapy: Secondary | ICD-10-CM | POA: Insufficient documentation

## 2018-03-09 DIAGNOSIS — Z841 Family history of disorders of kidney and ureter: Secondary | ICD-10-CM | POA: Insufficient documentation

## 2018-03-09 HISTORY — PX: BREAST LUMPECTOMY: SHX2

## 2018-03-09 HISTORY — PX: BREAST LUMPECTOMY WITH NEEDLE LOCALIZATION: SHX5759

## 2018-03-09 HISTORY — PX: BREAST EXCISIONAL BIOPSY: SUR124

## 2018-03-09 SURGERY — BREAST LUMPECTOMY WITH NEEDLE LOCALIZATION
Anesthesia: General | Laterality: Right

## 2018-03-09 MED ORDER — ONDANSETRON HCL 4 MG/2ML IJ SOLN: 4.0000 mg | Freq: Once | INTRAMUSCULAR | Status: DC | PRN

## 2018-03-09 MED ORDER — LACTATED RINGERS IV SOLN
INTRAVENOUS | Status: DC
  Administered 2018-03-09: 13:00:00 via INTRAVENOUS

## 2018-03-09 MED ORDER — DEXAMETHASONE SODIUM PHOSPHATE 10 MG/ML IJ SOLN
INTRAMUSCULAR | Status: AC
  Filled 2018-03-09: qty 1

## 2018-03-09 MED ORDER — BUPIVACAINE HCL (PF) 0.25 % IJ SOLN
INTRAMUSCULAR | Status: AC
  Filled 2018-03-09: qty 30

## 2018-03-09 MED ORDER — CHLORHEXIDINE GLUCONATE CLOTH 2 % EX PADS: 6.0000 | MEDICATED_PAD | Freq: Once | CUTANEOUS | Status: DC

## 2018-03-09 MED ORDER — PROPOFOL 10 MG/ML IV BOLUS
INTRAVENOUS | Status: DC | PRN
  Administered 2018-03-09: 200 mg via INTRAVENOUS

## 2018-03-09 MED ORDER — ONDANSETRON HCL 4 MG/2ML IJ SOLN
INTRAMUSCULAR | Status: AC
  Filled 2018-03-09: qty 2

## 2018-03-09 MED ORDER — FENTANYL CITRATE (PF) 100 MCG/2ML IJ SOLN
INTRAMUSCULAR | Status: AC
  Filled 2018-03-09: qty 2

## 2018-03-09 MED ORDER — GABAPENTIN 300 MG PO CAPS
300.0000 mg | ORAL_CAPSULE | ORAL | Status: AC
  Administered 2018-03-09: 300 mg via ORAL

## 2018-03-09 MED ORDER — BUPIVACAINE HCL (PF) 0.25 % IJ SOLN
INTRAMUSCULAR | Status: DC | PRN
  Administered 2018-03-09: 7 mL

## 2018-03-09 MED ORDER — MIDAZOLAM HCL 2 MG/2ML IJ SOLN
INTRAMUSCULAR | Status: DC | PRN
  Administered 2018-03-09: 2 mg via INTRAVENOUS

## 2018-03-09 MED ORDER — FENTANYL CITRATE (PF) 100 MCG/2ML IJ SOLN: 25.0000 ug | INTRAMUSCULAR | Status: DC | PRN

## 2018-03-09 MED ORDER — GABAPENTIN 300 MG PO CAPS
ORAL_CAPSULE | ORAL | Status: AC
  Administered 2018-03-09: 300 mg via ORAL
  Filled 2018-03-09: qty 1

## 2018-03-09 MED ORDER — ONDANSETRON HCL 4 MG/2ML IJ SOLN
INTRAMUSCULAR | Status: DC | PRN
  Administered 2018-03-09: 4 mg via INTRAVENOUS

## 2018-03-09 MED ORDER — CEFAZOLIN SODIUM-DEXTROSE 2-4 GM/100ML-% IV SOLN
INTRAVENOUS | Status: AC
  Filled 2018-03-09: qty 100

## 2018-03-09 MED ORDER — HYDROCODONE-ACETAMINOPHEN 5-325 MG PO TABS: 1.0000 | ORAL_TABLET | Freq: Four times a day (QID) | ORAL | 0 refills | Status: DC | PRN

## 2018-03-09 MED ORDER — DEXAMETHASONE SODIUM PHOSPHATE 10 MG/ML IJ SOLN
INTRAMUSCULAR | Status: DC | PRN
  Administered 2018-03-09: 10 mg via INTRAVENOUS

## 2018-03-09 MED ORDER — FAMOTIDINE 20 MG PO TABS
ORAL_TABLET | ORAL | Status: AC
  Administered 2018-03-09: 20 mg via ORAL
  Filled 2018-03-09: qty 1

## 2018-03-09 MED ORDER — ACETAMINOPHEN 500 MG PO TABS
ORAL_TABLET | ORAL | Status: AC
  Administered 2018-03-09: 1000 mg via ORAL
  Filled 2018-03-09: qty 2

## 2018-03-09 MED ORDER — ACETAMINOPHEN 500 MG PO TABS
1000.0000 mg | ORAL_TABLET | ORAL | Status: AC
  Administered 2018-03-09: 1000 mg via ORAL

## 2018-03-09 MED ORDER — FENTANYL CITRATE (PF) 100 MCG/2ML IJ SOLN
INTRAMUSCULAR | Status: DC | PRN
  Administered 2018-03-09 (×4): 25 ug via INTRAVENOUS

## 2018-03-09 MED ORDER — FAMOTIDINE 20 MG PO TABS
20.0000 mg | ORAL_TABLET | Freq: Once | ORAL | Status: AC
  Administered 2018-03-09: 20 mg via ORAL

## 2018-03-09 MED ORDER — MIDAZOLAM HCL 2 MG/2ML IJ SOLN
INTRAMUSCULAR | Status: AC
  Filled 2018-03-09: qty 2

## 2018-03-09 MED ORDER — PROPOFOL 10 MG/ML IV BOLUS
INTRAVENOUS | Status: AC
  Filled 2018-03-09: qty 20

## 2018-03-09 SURGICAL SUPPLY — 56 items
BINDER BREAST LRG (GAUZE/BANDAGES/DRESSINGS) ×2 IMPLANT
BINDER BREAST MEDIUM (GAUZE/BANDAGES/DRESSINGS) IMPLANT
BINDER BREAST XLRG (GAUZE/BANDAGES/DRESSINGS) IMPLANT
BINDER BREAST XXLRG (GAUZE/BANDAGES/DRESSINGS) IMPLANT
BLADE SURG 15 STRL SS SAFETY (BLADE) IMPLANT
CANISTER SUCT 1200ML W/VALVE (MISCELLANEOUS) ×2 IMPLANT
CHLORAPREP W/TINT 26ML (MISCELLANEOUS) ×2 IMPLANT
CLIP VESOCCLUDE MED 6/CT (CLIP) ×2 IMPLANT
CNTNR SPEC 2.5X3XGRAD LEK (MISCELLANEOUS)
CONT SPEC 4OZ STER OR WHT (MISCELLANEOUS)
CONTAINER SPEC 2.5X3XGRAD LEK (MISCELLANEOUS) IMPLANT
COVER PROBE FLX POLY STRL (MISCELLANEOUS) IMPLANT
COVER WAND RF STERILE (DRAPES) IMPLANT
DERMABOND ADVANCED (GAUZE/BANDAGES/DRESSINGS) ×1
DERMABOND ADVANCED .7 DNX12 (GAUZE/BANDAGES/DRESSINGS) ×1 IMPLANT
DEVICE DUBIN SPECIMEN MAMMOGRA (MISCELLANEOUS) ×2 IMPLANT
DRAPE CHEST BREAST 77X106 FENE (MISCELLANEOUS) IMPLANT
DRAPE LAPAROTOMY 100X77 ABD (DRAPES) IMPLANT
DRAPE LAPAROTOMY 77X122 PED (DRAPES) ×2 IMPLANT
DRSG GAUZE FLUFF 36X18 (GAUZE/BANDAGES/DRESSINGS) ×2 IMPLANT
DRSG TELFA 4X3 1S NADH ST (GAUZE/BANDAGES/DRESSINGS) IMPLANT
ELECT CAUTERY BLADE TIP 2.5 (TIP) ×2
ELECT REM PT RETURN 9FT ADLT (ELECTROSURGICAL) ×2
ELECTRODE CAUTERY BLDE TIP 2.5 (TIP) ×1 IMPLANT
ELECTRODE REM PT RTRN 9FT ADLT (ELECTROSURGICAL) ×1 IMPLANT
GLOVE BIO SURGEON STRL SZ 6.5 (GLOVE) ×4 IMPLANT
GLOVE BIO SURGEON STRL SZ7.5 (GLOVE) IMPLANT
GLOVE INDICATOR 7.0 STRL GRN (GLOVE) ×4 IMPLANT
GLOVE INDICATOR 8.0 STRL GRN (GLOVE) IMPLANT
GOWN STRL REUS W/ TWL LRG LVL3 (GOWN DISPOSABLE) ×2 IMPLANT
GOWN STRL REUS W/TWL LRG LVL3 (GOWN DISPOSABLE) ×2
KIT TURNOVER KIT A (KITS) ×2 IMPLANT
LABEL OR SOLS (LABEL) ×2 IMPLANT
MARGIN MAP 10MM (MISCELLANEOUS) IMPLANT
NEEDLE HYPO 22GX1.5 SAFETY (NEEDLE) ×2 IMPLANT
NEEDLE HYPO 25X1 1.5 SAFETY (NEEDLE) ×2 IMPLANT
PACK BASIN MINOR ARMC (MISCELLANEOUS) ×2 IMPLANT
RETRACTOR RING XSMALL (MISCELLANEOUS) IMPLANT
RTRCTR WOUND ALEXIS 13CM XS SH (MISCELLANEOUS)
STRIP CLOSURE SKIN 1/2X4 (GAUZE/BANDAGES/DRESSINGS) ×2 IMPLANT
SUT ETHILON 3-0 FS-10 30 BLK (SUTURE)
SUT MNCRL 4-0 (SUTURE) ×1
SUT MNCRL 4-0 27XMFL (SUTURE) ×1
SUT SILK 2 0 SH (SUTURE) ×2 IMPLANT
SUT VIC AB 2-0 CT1 27 (SUTURE)
SUT VIC AB 2-0 CT1 TAPERPNT 27 (SUTURE) IMPLANT
SUT VIC AB 3-0 SH 27 (SUTURE) ×1
SUT VIC AB 3-0 SH 27X BRD (SUTURE) ×1 IMPLANT
SUT VIC AB 4-0 FS2 27 (SUTURE) IMPLANT
SUTURE EHLN 3-0 FS-10 30 BLK (SUTURE) IMPLANT
SUTURE MNCRL 4-0 27XMF (SUTURE) ×1 IMPLANT
SWABSTK COMLB BENZOIN TINCTURE (MISCELLANEOUS) IMPLANT
SYR 10ML LL (SYRINGE) ×2 IMPLANT
SYR BULB IRRIG 60ML STRL (SYRINGE) ×2 IMPLANT
TAPE TRANSPORE STRL 2 31045 (GAUZE/BANDAGES/DRESSINGS) IMPLANT
WATER STERILE IRR 1000ML POUR (IV SOLUTION) ×2 IMPLANT

## 2018-03-09 NOTE — Progress Notes (Signed)
Ice to incisional area

## 2018-03-09 NOTE — Progress Notes (Signed)
Ginger ale given to patient to drink.

## 2018-03-09 NOTE — Transfer of Care (Signed)
Immediate Anesthesia Transfer of Care Note  Patient: Rhonda Reeves  Procedure(s) Performed: BREAST LUMPECTOMY WITH NEEDLE LOCALIZATION (Right )  Patient Location: PACU  Anesthesia Type:General  Level of Consciousness: drowsy and patient cooperative  Airway & Oxygen Therapy: Patient Spontanous Breathing and Patient connected to face mask oxygen  Post-op Assessment: Report given to RN and Post -op Vital signs reviewed and stable  Post vital signs: Reviewed and stable  Last Vitals:  Vitals Value Taken Time  BP 155/82 03/09/2018  2:24 PM  Temp    Pulse 90 03/09/2018  2:24 PM  Resp 13 03/09/2018  2:24 PM  SpO2 99 % 03/09/2018  2:24 PM  Vitals shown include unvalidated device data.  Last Pain:  Vitals:   03/09/18 1250  TempSrc: Temporal  PainSc: 0-No pain         Complications: No apparent anesthesia complications

## 2018-03-09 NOTE — Discharge Instructions (Signed)

## 2018-03-09 NOTE — Op Note (Signed)
Operative Note  Preoperative Diagnosis:  Radial scar of breast  Postoperative Diagnosis:  same  Operation:  Wire-localized partial mastectomy  Surgeon: Fredirick Maudlin, MD  Assistant: none  Anesthesia: general via LMA  Findings: post-resection imaging confirmed specimen contained the previously placed biopsy coil and the entire wire/hook.  Indications: Rhonda Reeves is a 62 year old woman who underwent routine screening mammogram.  She was called back for an area of architectural distortion in the right lower quadrant.  This was not a palpable mass.  Subsequent ultrasound and stereotactic core biopsy were performed.  The final pathology was found to be consistent with a radial scar.  Based upon the 8 to 17% risk of finding a breast carcinoma within these types of lesions she was referred for surgical excision.  We discussed the risks of the procedure including bleeding, infection, need for additional excision, possible mastectomy, possible radiation and hormonal therapy.  We also discussed the risks of seroma or hematoma formation as well as distortion of her breast architecture.  She agreed to accept these risks and gave informed consent for the procedure.  Procedure In Detail: The patient was seen in mammography where a hooked wire was placed to identify the region of interest.  These films were reviewed prior to proceeding to the operating room.  She was seen in the preoperative holding area and appropriately marked.  Once again her questions were answered as well as those of her husband.  She was then taken to the operating room and placed supine on the OR table.  Bony prominences were padded and sequential compression devices were in place.  General anesthesia was induced during using a laryngeal mask airway.  The patient was positioned appropriately for the operation and sterilely prepped and draped in standard fashion.  A timeout was performed confirming the patient's identity, the procedure  being performed, her allergies, all necessary equipment was available and that maintenance anesthesia was adequate.  A perioperative dose of Ancef was administered by the anesthesia team.  I infiltrated the skin and subcutaneum surrounding the wire with 0.25%  bupivacaine without epinephrine.  I then sketched out a parallelogram to perform essentially a lateral segmentectomy of the breast to incorporate the specimen.  An incision was made using a 15 blade and carried down through the subcutaneum using electrocautery.  The skin was slightly undermined to provide good closure at the end of the operation.  I then followed the soft tissue of the breast along the course of the wire until I identified the hook.  Then transected the specimen and removed the entire block of tissue.  The specimen was oriented with suture: short stitch superior, long stitch lateral, double stitch deep. This was sent to radiology for confirmation of inclusion of all areas of concern.  5 titanium ligaclips were placed in the cavity to delineate the margins of my resection.  I then irrigated the wound and examined it for hemostasis.  No surgical bleeding was identified.  The wound was then closed in 2 layers with a deep dermal layer of 3-0 Vicryl and a running subcuticular layer of Monocryl.  We received word back from mammography that our specimen had incorporated all areas of concern.  I then cleaned the skin and applied Dermabond and Steri-Strips.  Fluffs and a compressive garment were applied.  The patient was then awakened from her anesthesia and the LMA was removed.  She was transported to the postanesthesia care unit in good condition.  EBL: less than 5 cc  IVF:  450 cc crystalloid  Specimen(s): right partial mastectomy for permanent pathology  Complications: none immediately apparent.   Counts: all needles, instruments, and sponges were counted and reported to be correct in number at the end of the case.   I was present for  and participated in the entire operation.  Fredirick Maudlin 11.19.19 2:51 PM

## 2018-03-09 NOTE — Anesthesia Procedure Notes (Signed)
Procedure Name: LMA Insertion Date/Time: 03/09/2018 1:11 PM Performed by: Jonna Clark, CRNA Pre-anesthesia Checklist: Patient identified, Patient being monitored, Timeout performed, Emergency Drugs available and Suction available Patient Re-evaluated:Patient Re-evaluated prior to induction Oxygen Delivery Method: Circle system utilized Preoxygenation: Pre-oxygenation with 100% oxygen Induction Type: IV induction Ventilation: Mask ventilation without difficulty LMA: LMA inserted LMA Size: 4.0 Tube type: Oral Number of attempts: 1 Placement Confirmation: positive ETCO2 and breath sounds checked- equal and bilateral Tube secured with: Tape Dental Injury: Teeth and Oropharynx as per pre-operative assessment

## 2018-03-09 NOTE — Anesthesia Post-op Follow-up Note (Signed)
Anesthesia QCDR form completed.        

## 2018-03-09 NOTE — Interval H&P Note (Signed)
History and Physical Interval Note:  03/09/2018 12:32 PM  Rhonda Reeves  has presented today for surgery, with the diagnosis of right breast complex sclerosis radial scar  The various methods of treatment have been discussed with the patient and family. After consideration of risks, benefits and other options for treatment, the patient has consented to  Procedure(s): BREAST LUMPECTOMY WITH NEEDLE LOCALIZATION (Right) as a surgical intervention .  The patient's history has been reviewed, patient examined, no change in status, stable for surgery.  I have reviewed the patient's chart and labs.  Questions were answered to the patient's satisfaction.     Fredirick Maudlin

## 2018-03-09 NOTE — Anesthesia Preprocedure Evaluation (Addendum)
Anesthesia Evaluation  Patient identified by MRN, date of birth, ID band Patient awake    Reviewed: Allergy & Precautions, H&P , NPO status , Patient's Chart, lab work & pertinent test results, reviewed documented beta blocker date and time   Airway Mallampati: II  TM Distance: >3 FB Neck ROM: full    Dental  (+) Teeth Intact   Pulmonary neg pulmonary ROS, former smoker,    Pulmonary exam normal        Cardiovascular Exercise Tolerance: Good negative cardio ROS Normal cardiovascular exam Rate:Normal     Neuro/Psych PSYCHIATRIC DISORDERS Anxiety Depression negative neurological ROS     GI/Hepatic negative GI ROS, Neg liver ROS,   Endo/Other  negative endocrine ROS  Renal/GU negative Renal ROS  negative genitourinary   Musculoskeletal   Abdominal   Peds  Hematology negative hematology ROS (+)   Anesthesia Other Findings   Reproductive/Obstetrics negative OB ROS                            Anesthesia Physical Anesthesia Plan  ASA: II  Anesthesia Plan: General LMA   Post-op Pain Management:    Induction:   PONV Risk Score and Plan:   Airway Management Planned:   Additional Equipment:   Intra-op Plan:   Post-operative Plan:   Informed Consent: I have reviewed the patients History and Physical, chart, labs and discussed the procedure including the risks, benefits and alternatives for the proposed anesthesia with the patient or authorized representative who has indicated his/her understanding and acceptance.     Plan Discussed with: CRNA  Anesthesia Plan Comments:         Anesthesia Quick Evaluation

## 2018-03-09 NOTE — Brief Op Note (Signed)
03/09/2018  2:27 PM  PATIENT:  Rhonda Reeves  63 y.o. female  PRE-OPERATIVE DIAGNOSIS:  right breast complex sclerosis radial scar  POST-OPERATIVE DIAGNOSIS:  right breast complex sclerosis radial scar  PROCEDURE:  Procedure(s): BREAST LUMPECTOMY WITH NEEDLE LOCALIZATION (Right)  SURGEON:  Surgeon(s) and Role:    Fredirick Maudlin, MD - Primary  PHYSICIAN ASSISTANT:   ASSISTANTS: none   ANESTHESIA:   general via LMA  EBL: minimal  BLOOD ADMINISTERED:none  DRAINS: none   LOCAL MEDICATIONS USED:  BUPIVICAINE 0.25%  SPECIMEN:  Lumpectomy  DISPOSITION OF SPECIMEN:  PATHOLOGY  COUNTS:  YES  TOURNIQUET:  * No tourniquets in log *  DICTATION: .Dragon Dictation  PLAN OF CARE: Discharge to home after PACU  PATIENT DISPOSITION:  PACU - hemodynamically stable.   Delay start of Pharmacological VTE agent (>24hrs) due to surgical blood loss or risk of bleeding: not applicable

## 2018-03-10 ENCOUNTER — Encounter: Payer: Self-pay | Admitting: General Surgery

## 2018-03-11 LAB — SURGICAL PATHOLOGY

## 2018-03-11 NOTE — Anesthesia Postprocedure Evaluation (Signed)
Anesthesia Post Note  Patient: CINDIE RAJAGOPALAN  Procedure(s) Performed: BREAST LUMPECTOMY WITH NEEDLE LOCALIZATION (Right )  Patient location during evaluation: PACU Anesthesia Type: General Level of consciousness: awake and alert Pain management: pain level controlled Vital Signs Assessment: post-procedure vital signs reviewed and stable Respiratory status: spontaneous breathing, nonlabored ventilation, respiratory function stable and patient connected to nasal cannula oxygen Cardiovascular status: blood pressure returned to baseline and stable Postop Assessment: no apparent nausea or vomiting Anesthetic complications: no     Last Vitals:  Vitals:   03/09/18 1458 03/09/18 1528  BP: (!) 145/81 133/64  Pulse: 78 76  Resp:    Temp: 36.8 C   SpO2: 96% 97%    Last Pain:  Vitals:   03/09/18 1528  TempSrc:   PainSc: 3                  Molli Barrows

## 2018-03-12 ENCOUNTER — Telehealth: Payer: Self-pay | Admitting: General Surgery

## 2018-03-12 NOTE — Telephone Encounter (Signed)
Spoke w/pt re pathology results.  No concern for malignancy. She is doing well post-op.

## 2018-03-22 ENCOUNTER — Ambulatory Visit (INDEPENDENT_AMBULATORY_CARE_PROVIDER_SITE_OTHER): Payer: PRIVATE HEALTH INSURANCE | Admitting: General Surgery

## 2018-03-22 ENCOUNTER — Other Ambulatory Visit: Payer: Self-pay

## 2018-03-22 ENCOUNTER — Encounter: Payer: Self-pay | Admitting: General Surgery

## 2018-03-22 VITALS — BP 128/78 | HR 82 | Temp 97.9°F | Ht 65.0 in | Wt 132.0 lb

## 2018-03-22 DIAGNOSIS — Z9889 Other specified postprocedural states: Secondary | ICD-10-CM

## 2018-03-22 NOTE — Progress Notes (Signed)
She is here for a post-op check after undergoing a wire-localized partial mastectomy (lumpectomy) on 03/09/18 for a radial scar detected on routine mammography.  Her final pathology was benign.  She has done well since her operation and required very little pain medication.  No concerns with her incision.  Still wearing breast binder.  Surgical Pathology  CASE: [phone]  PATIENT: [facility]  Surgical Pathology Report      SPECIMEN SUBMITTED:  A. Breast, right; partial mastectomy with wire   CLINICAL HISTORY:  None provided   PRE-OPERATIVE DIAGNOSIS:  Right breast complex sclerosis radial scar   POST-OPERATIVE DIAGNOSIS:  Same as pre-op      DIAGNOSIS:  A. BREAST, RIGHT, PARTIAL MASTECTOMY:  - PREVIOUS BIOPSY SITE WITH HEMORRHAGE.  - FIBROCYSTIC CHANGES.  - FOCAL USUAL DUCT HYPERPLASIA.  -MARGINS NEGATIVE.    GROSS DESCRIPTION:  A. Labeled: Right partial mastectomy with wire  Received: Fresh  Accompanying specimen radiograph: Yes  Radiographic findings: 1 wire and one metallic clip  Time in fixative: 2:10 PM on 03/09/2018   Cold ischemic time: 4 minutes  Total fixation time: 26 hours  Type of procedure: Lumpectomy  Location / laterality of specimen: Right  Orientation of specimen: Short stitch superior, long lateral, double  deep  Inking:  Superior = blue  Inferior = green  Medial = yellow  Lateral = orange  Posterior = black  Anterior/Superficial = skin  Size of specimen: 5.3 (anterior-posterior) by 3.1 (medial-lateral) by  1.7 (superior-inferior) centimeter  Skin: 2.4 x 0.8 cm ellipse of wrinkled pink-tan skin   Biopsy site: Present with metallic clip, disrupted adjacent to biopsy  area  Number of discrete masses: No discrete mass, biopsy site with hemorrhage  and metallic clip  Size of mass(es)/biopsy site: 1.1 x 1.0 x 0.9 cm  Description of mass(es): Area of hemorrhage with surrounding fat  necrosis and metallic clip  Distance between  masses/clips: not applicable  Margins: Less than 0.1 cm from superior, less than 0.1 cm from lateral,  1.5 cm from medial, 2.1 cm from anterior, 0.2 cm from deep and 0.2 cm  from inferior  Description of remainder of tissue: Yellow lobulated fibrofatty  Block summary:  1-6 - entire biopsy site with all closest margins perpendicular lateral  to medial (cassette 2 containing metallic clip)  7 - perpendicular medial  8 - perpendicular anterior   Final Diagnosis performed by Raynelle Bring, MD.  Electronically signed  03/11/2018 2:47:55PM  The electronic signature indicates that the named Attending Pathologist  has evaluated the specimen   Vitals:   03/22/18 1559  BP: 128/78  Pulse: 82  Temp: 97.9 F (36.6 C)  SpO2: 98%   Focused exam of surgical site: steri strips intact.  Minimal bruising.  The steri strips were removed. There was some superficial serous exudate, but no additional fluid could be expressed.  Incision is well approximated and there is no distortion of the breast contour.  A/P: 62 y/o F doing well after lumpectomy.  Continue to wear supportive breast garment for the next 4-6 weeks.  Increase activity as tolerated, particularly ROM of right arm.  No increased surveillance is warranted based upon pathology; continue with annual screening mammograms.

## 2018-03-22 NOTE — Patient Instructions (Signed)
Return as needed. Get yearly mammograms and self breast checks. The patient is aware to call back for any questions or concerns.

## 2018-03-24 ENCOUNTER — Encounter: Payer: PRIVATE HEALTH INSURANCE | Admitting: General Surgery

## 2018-06-23 ENCOUNTER — Encounter (INDEPENDENT_AMBULATORY_CARE_PROVIDER_SITE_OTHER): Payer: PRIVATE HEALTH INSURANCE | Admitting: Ophthalmology

## 2018-08-04 ENCOUNTER — Encounter: Payer: 59 | Admitting: Family Medicine

## 2018-10-14 ENCOUNTER — Other Ambulatory Visit: Payer: Self-pay | Admitting: Family Medicine

## 2018-10-14 DIAGNOSIS — Z1231 Encounter for screening mammogram for malignant neoplasm of breast: Secondary | ICD-10-CM

## 2018-10-25 ENCOUNTER — Ambulatory Visit: Payer: Self-pay | Admitting: Family Medicine

## 2018-11-04 ENCOUNTER — Encounter: Payer: Self-pay | Admitting: Family Medicine

## 2018-11-04 ENCOUNTER — Other Ambulatory Visit: Payer: Self-pay

## 2018-11-04 ENCOUNTER — Ambulatory Visit (INDEPENDENT_AMBULATORY_CARE_PROVIDER_SITE_OTHER): Payer: 59 | Admitting: Family Medicine

## 2018-11-04 DIAGNOSIS — F3341 Major depressive disorder, recurrent, in partial remission: Secondary | ICD-10-CM

## 2018-11-04 DIAGNOSIS — Z0000 Encounter for general adult medical examination without abnormal findings: Secondary | ICD-10-CM

## 2018-11-04 DIAGNOSIS — F339 Major depressive disorder, recurrent, unspecified: Secondary | ICD-10-CM | POA: Diagnosis not present

## 2018-11-04 MED ORDER — FLUOXETINE HCL 10 MG PO CAPS: 10.0000 mg | ORAL_CAPSULE | Freq: Every day | ORAL | 4 refills | Status: DC

## 2018-11-04 MED ORDER — OLANZAPINE 2.5 MG PO TABS: 2.5000 mg | ORAL_TABLET | ORAL | 4 refills | Status: DC

## 2018-11-04 MED ORDER — LAMOTRIGINE 200 MG PO TABS: ORAL_TABLET | ORAL | 4 refills | Status: DC

## 2018-11-04 NOTE — Progress Notes (Signed)
   There were no vitals taken for this visit.   Subjective:    Patient ID: Rhonda Reeves, female    DOB: 1956/02/05, 63 y.o.   MRN: [id number]  HPI: Rhonda Reeves is a 63 y.o. female  Med check Discussed with patient depression doing well taking medications without problems. Anxiety nerves stable having issues with COVID-19 but is worked into a regimen and is stable. Patient's weight is been a concern wants to lose 5 pounds discussed weight loss different techniques with medications.  Relevant past medical, surgical, family and social history reviewed and updated as indicated. Interim medical history since our last visit reviewed. Allergies and medications reviewed and updated.  Review of Systems  Constitutional: Negative.   Respiratory: Negative.   Cardiovascular: Negative.     Per HPI unless specifically indicated above     Objective:    There were no vitals taken for this visit.  Wt Readings from Last 3 Encounters:  03/22/18 132 lb (59.9 kg)  03/03/18 132 lb 9.6 oz (60.1 kg)  01/20/18 130 lb 8 oz (59.2 kg)    Physical Exam      Assessment & Plan:   Problem List Items Addressed This Visit      Other   Depression, recurrent ([place])    The current medical regimen is effective;  continue present plan and medications.       Relevant Medications   lamoTRIgine (LAMICTAL) 200 MG tablet   FLUoxetine (PROZAC) 10 MG capsule   OLANZapine (ZYPREXA) 2.5 MG tablet     Telemedicine using audio/video telecommunications for a synchronous communication visit. Today's visit due to COVID-19 isolation precautions I connected with and verified that I am speaking with the correct person using two identifiers.   I discussed the limitations, risks, security and privacy concerns of performing an evaluation and management service by telecommunication and the availability of in person appointments. I also discussed with the patient that there may be a patient responsible charge  related to this service. The patient expressed understanding and agreed to proceed. The patient's location is home. I am at home.   I discussed the assessment and treatment plan with the patient. The patient was provided an opportunity to ask questions and all were answered. The patient agreed with the plan and demonstrated an understanding of the instructions.   The patient was advised to call back or seek an in-person evaluation if the symptoms worsen or if the condition fails to improve as anticipated.   I provided 21+ minutes of time during this encounter.  Follow up plan: Return for Physical Exam soon and 6 mo f/u.

## 2018-11-04 NOTE — Assessment & Plan Note (Signed)
The current medical regimen is effective;  continue present plan and medications.  

## 2018-11-30 ENCOUNTER — Other Ambulatory Visit: Payer: Self-pay

## 2018-11-30 ENCOUNTER — Other Ambulatory Visit: Payer: 59

## 2018-11-30 DIAGNOSIS — Z0000 Encounter for general adult medical examination without abnormal findings: Secondary | ICD-10-CM

## 2018-11-30 LAB — URINALYSIS, ROUTINE W REFLEX MICROSCOPIC
Bilirubin, UA: NEGATIVE
Glucose, UA: NEGATIVE
Ketones, UA: NEGATIVE
Leukocytes,UA: NEGATIVE
Nitrite, UA: NEGATIVE
Protein,UA: NEGATIVE
RBC, UA: NEGATIVE
Specific Gravity, UA: 1.015 (ref 1.005–1.030)
Urobilinogen, Ur: 0.2 mg/dL (ref 0.2–1.0)
pH, UA: 8.5 — ABNORMAL HIGH (ref 5.0–7.5)

## 2018-12-01 ENCOUNTER — Encounter: Payer: Self-pay | Admitting: Family Medicine

## 2018-12-01 LAB — CBC WITH DIFFERENTIAL/PLATELET
Basophils Absolute: 0 10*3/uL (ref 0.0–0.2)
Basos: 1 %
EOS (ABSOLUTE): 0.2 10*3/uL (ref 0.0–0.4)
Eos: 4 %
Hematocrit: 42.4 % (ref 34.0–46.6)
Hemoglobin: 14.7 g/dL (ref 11.1–15.9)
Immature Grans (Abs): 0 10*3/uL (ref 0.0–0.1)
Immature Granulocytes: 0 %
Lymphocytes Absolute: 2 10*3/uL (ref 0.7–3.1)
Lymphs: 38 %
MCH: 30.6 pg (ref 26.6–33.0)
MCHC: 34.7 g/dL (ref 31.5–35.7)
MCV: 88 fL (ref 79–97)
Monocytes Absolute: 0.4 10*3/uL (ref 0.1–0.9)
Monocytes: 8 %
Neutrophils Absolute: 2.6 10*3/uL (ref 1.4–7.0)
Neutrophils: 49 %
Platelets: 303 10*3/uL (ref 150–450)
RBC: 4.8 x10E6/uL (ref 3.77–5.28)
RDW: 11.7 % (ref 11.7–15.4)
WBC: 5.2 10*3/uL (ref 3.4–10.8)

## 2018-12-01 LAB — COMPREHENSIVE METABOLIC PANEL
ALT: 15 IU/L (ref 0–32)
AST: 21 IU/L (ref 0–40)
Albumin/Globulin Ratio: 2 (ref 1.2–2.2)
Albumin: 4.6 g/dL (ref 3.8–4.8)
Alkaline Phosphatase: 75 IU/L (ref 39–117)
BUN/Creatinine Ratio: 21 (ref 12–28)
BUN: 18 mg/dL (ref 8–27)
Bilirubin Total: 0.5 mg/dL (ref 0.0–1.2)
CO2: 26 mmol/L (ref 20–29)
Calcium: 9.5 mg/dL (ref 8.7–10.3)
Chloride: 103 mmol/L (ref 96–106)
Creatinine, Ser: 0.87 mg/dL (ref 0.57–1.00)
GFR calc Af Amer: 81 mL/min/{1.73_m2} (ref >59)
GFR calc non Af Amer: 71 mL/min/{1.73_m2} (ref >59)
Globulin, Total: 2.3 g/dL (ref 1.5–4.5)
Glucose: 89 mg/dL (ref 65–99)
Potassium: 4.5 mmol/L (ref 3.5–5.2)
Sodium: 142 mmol/L (ref 134–144)
Total Protein: 6.9 g/dL (ref 6.0–8.5)

## 2018-12-01 LAB — LIPID PANEL
Chol/HDL Ratio: 3.3 ratio (ref 0.0–4.4)
Cholesterol, Total: 251 mg/dL — ABNORMAL HIGH (ref 100–199)
HDL: 76 mg/dL (ref >39)
LDL Calculated: 155 mg/dL — ABNORMAL HIGH (ref 0–99)
Triglycerides: 102 mg/dL (ref 0–149)
VLDL Cholesterol Cal: 20 mg/dL (ref 5–40)

## 2018-12-01 LAB — TSH: TSH: 1.84 u[IU]/mL (ref 0.450–4.500)

## 2019-01-03 ENCOUNTER — Encounter (INDEPENDENT_AMBULATORY_CARE_PROVIDER_SITE_OTHER): Payer: PRIVATE HEALTH INSURANCE | Admitting: Ophthalmology

## 2019-01-03 ENCOUNTER — Other Ambulatory Visit: Payer: Self-pay

## 2019-01-03 DIAGNOSIS — H43813 Vitreous degeneration, bilateral: Secondary | ICD-10-CM

## 2019-01-03 DIAGNOSIS — H35342 Macular cyst, hole, or pseudohole, left eye: Secondary | ICD-10-CM

## 2019-01-03 DIAGNOSIS — H35372 Puckering of macula, left eye: Secondary | ICD-10-CM | POA: Diagnosis not present

## 2019-01-10 ENCOUNTER — Encounter: Payer: Self-pay | Admitting: Family Medicine

## 2019-01-10 ENCOUNTER — Ambulatory Visit (INDEPENDENT_AMBULATORY_CARE_PROVIDER_SITE_OTHER): Payer: 59 | Admitting: Family Medicine

## 2019-01-10 ENCOUNTER — Other Ambulatory Visit: Payer: Self-pay

## 2019-01-10 VITALS — BP 129/78 | HR 75 | Temp 98.9°F | Ht 61.0 in | Wt 134.0 lb

## 2019-01-10 DIAGNOSIS — Z1211 Encounter for screening for malignant neoplasm of colon: Secondary | ICD-10-CM | POA: Diagnosis not present

## 2019-01-10 DIAGNOSIS — Z23 Encounter for immunization: Secondary | ICD-10-CM | POA: Diagnosis not present

## 2019-01-10 DIAGNOSIS — Z0000 Encounter for general adult medical examination without abnormal findings: Secondary | ICD-10-CM | POA: Diagnosis not present

## 2019-01-10 NOTE — Progress Notes (Signed)
BP 129/78   Pulse 75   Temp 98.9 F (37.2 C) (Oral)   Ht 5\' 1"  (1.549 m)   Wt 134 lb (60.8 kg)   SpO2 99%   BMI 25.32 kg/m    Subjective:    Patient ID: Rhonda Reeves, female    DOB: 10/04/1955, 63 y.o.   MRN: [id number]  HPI: Rhonda Reeves is a 63 y.o. female presenting on 01/10/2019 for comprehensive medical examination. Current medical complaints include:none  She currently lives with: husband Menopausal Symptoms: no  Depression Screen done today and results listed below:  Depression screen [facility] 2/9 01/10/2019 01/20/2018 03/31/2017 01/30/2016 07/03/2015  Decreased Interest 0 0 0 0 0  Down, Depressed, Hopeless 0 0 0 0 0  PHQ - 2 Score 0 0 0 0 0  Altered sleeping - 1 - - 0  Tired, decreased energy - 1 - - 0  Change in appetite - 0 - - 0  Feeling bad or failure about yourself  - 0 - - 0  Trouble concentrating - 0 - - 0  Moving slowly or fidgety/restless - 0 - - 0  Suicidal thoughts - 0 - - 0  PHQ-9 Score - 2 - - 0    Past Medical History:  Past Medical History:  Diagnosis Date  . Depression   . History of kidney stones    H/O  . Osteoporosis     Surgical History:  Past Surgical History:  Procedure Laterality Date  . ABDOMINAL HYSTERECTOMY    . BREAST BIOPSY Right 02/24/2018   affirm bx, coil clip, radial scar  . BREAST LUMPECTOMY Right 03/09/2018   radial scar  . BREAST LUMPECTOMY WITH NEEDLE LOCALIZATION Right 03/09/2018   Procedure: BREAST LUMPECTOMY WITH NEEDLE LOCALIZATION;  Surgeon: Fredirick Maudlin, MD;  Location: [facility] ORS;  Service: General;  Laterality: Right;  . CHOLECYSTECTOMY N/A 11/21/2017   Procedure: LAPAROSCOPIC CHOLECYSTECTOMY WITH INTRAOPERATIVE CHOLANGIOGRAM;  Surgeon: Florene Glen, MD;  Location: [facility] ORS;  Service: General;  Laterality: N/A;  . ENDOSCOPIC RETROGRADE CHOLANGIOPANCREATOGRAPHY (ERCP) WITH PROPOFOL N/A 11/20/2017   Procedure: ENDOSCOPIC RETROGRADE CHOLANGIOPANCREATOGRAPHY (ERCP) WITH PROPOFOL;  Surgeon: Lucilla Lame, MD;   Location: [facility] ENDOSCOPY;  Service: Endoscopy;  Laterality: N/A;  . HEMORRHOID SURGERY      Medications:  Current Outpatient Medications on File Prior to Visit  Medication Sig  . Calcium Carb-Cholecalciferol (CALCIUM 600 + D PO) Take 1 tablet by mouth daily.  Marland Kitchen FLUoxetine (PROZAC) 10 MG capsule Take 1 capsule (10 mg total) by mouth daily.  Marland Kitchen lamoTRIgine (LAMICTAL) 200 MG tablet Take 1 tablet by mouth  daily  . Multiple Vitamins-Minerals (MULTIVITAMIN WITH MINERALS) tablet Take 1 tablet by mouth daily.  Marland Kitchen OLANZapine (ZYPREXA) 2.5 MG tablet Take 1 tablet (2.5 mg total) by mouth every morning.   No current facility-administered medications on file prior to visit.     Allergies:  Allergies  Allergen Reactions  . Sulfa Antibiotics Other (See Comments)    Unknown    Social History:  Social History   Socioeconomic History  . Marital status: Married    Spouse name: Not on file  . Number of children: Not on file  . Years of education: Not on file  . Highest education level: Not on file  Occupational History  . Not on file  Social Needs  . Financial resource strain: Not on file  . Food insecurity    Worry: Not on file    Inability: Not on file  . Transportation  needs    Medical: Not on file    Non-medical: Not on file  Tobacco Use  . Smoking status: Former Smoker    Packs/day: 1.00    Years: 35.00    Pack years: 35.00    Types: Cigarettes    Quit date: 03/03/2005    Years since quitting: 13.8  . Smokeless tobacco: Never Used  Substance and Sexual Activity  . Alcohol use: Yes    Alcohol/week: 1.0 standard drinks    Types: 1 Cans of beer per week    Comment: 1 BEER EVERDAY  . Drug use: No  . Sexual activity: Not on file  Lifestyle  . Physical activity    Days per week: Not on file    Minutes per session: Not on file  . Stress: Not on file  Relationships  . Social Herbalist on phone: Not on file    Gets together: Not on file    Attends religious  service: Not on file    Active member of club or organization: Not on file    Attends meetings of clubs or organizations: Not on file    Relationship status: Not on file  . Intimate partner violence    Fear of current or ex partner: Not on file    Emotionally abused: Not on file    Physically abused: Not on file    Forced sexual activity: Not on file  Other Topics Concern  . Not on file  Social History Narrative  . Not on file   Social History   Tobacco Use  Smoking Status Former Smoker  . Packs/day: 1.00  . Years: 35.00  . Pack years: 35.00  . Types: Cigarettes  . Quit date: 03/03/2005  . Years since quitting: 13.8  Smokeless Tobacco Never Used   Social History   Substance and Sexual Activity  Alcohol Use Yes  . Alcohol/week: 1.0 standard drinks  . Types: 1 Cans of beer per week   Comment: 1 BEER EVERDAY    Family History:  Family History  Problem Relation Age of Onset  . Osteoporosis Mother   . Graves' disease Mother   . Osteoporosis Father   . Hernia Father   . Heart disease Father   . Kidney Stones Father   . Hypotension Father     Past medical history, surgical history, medications, allergies, family history and social history reviewed with patient today and changes made to appropriate areas of the chart.   Review of Systems  Constitutional: Negative.   HENT: Positive for tinnitus. Negative for congestion, ear discharge, ear pain, hearing loss, nosebleeds, sinus pain and sore throat.   Eyes: Negative.   Respiratory: Negative.  Negative for stridor.   Cardiovascular: Negative.   Gastrointestinal: Negative.   Genitourinary: Negative.   Musculoskeletal: Negative.   Skin: Negative.   Neurological: Negative.   Endo/Heme/Allergies: Negative.   Psychiatric/Behavioral: Negative.     All other ROS negative except what is listed above and in the HPI.      Objective:    BP 129/78   Pulse 75   Temp 98.9 F (37.2 C) (Oral)   Ht 5\' 1"  (1.549 m)   Wt  134 lb (60.8 kg)   SpO2 99%   BMI 25.32 kg/m   Wt Readings from Last 3 Encounters:  01/10/19 134 lb (60.8 kg)  03/22/18 132 lb (59.9 kg)  03/03/18 132 lb 9.6 oz (60.1 kg)    Physical Exam Vitals signs and nursing  note reviewed.  Constitutional:      General: She is not in acute distress.    Appearance: Normal appearance. She is not ill-appearing, toxic-appearing or diaphoretic.  HENT:     Head: Normocephalic and atraumatic.     Right Ear: Tympanic membrane, ear canal and external ear normal. There is no impacted cerumen.     Left Ear: Tympanic membrane, ear canal and external ear normal. There is no impacted cerumen.     Nose: Nose normal. No congestion or rhinorrhea.     Mouth/Throat:     Mouth: Mucous membranes are moist.     Pharynx: Oropharynx is clear. No oropharyngeal exudate or posterior oropharyngeal erythema.  Eyes:     General: No scleral icterus.       Right eye: No discharge.        Left eye: No discharge.     Extraocular Movements: Extraocular movements intact.     Conjunctiva/sclera: Conjunctivae normal.     Pupils: Pupils are equal, round, and reactive to light.  Neck:     Musculoskeletal: Normal range of motion and neck supple. No neck rigidity or muscular tenderness.     Vascular: No carotid bruit.  Cardiovascular:     Rate and Rhythm: Normal rate and regular rhythm.     Pulses: Normal pulses.     Heart sounds: No murmur. No friction rub. No gallop.   Pulmonary:     Effort: Pulmonary effort is normal. No respiratory distress.     Breath sounds: Normal breath sounds. No stridor. No wheezing, rhonchi or rales.  Chest:     Chest wall: No tenderness.  Abdominal:     General: Abdomen is flat. Bowel sounds are normal. There is no distension.     Palpations: Abdomen is soft. There is no mass.     Tenderness: There is no abdominal tenderness. There is no right CVA tenderness, left CVA tenderness, guarding or rebound.     Hernia: No hernia is present.   Genitourinary:    Comments: Breast and pelvic exams deferred with shared decision making Musculoskeletal:        General: No swelling, tenderness, deformity or signs of injury.     Right lower leg: No edema.     Left lower leg: No edema.  Lymphadenopathy:     Cervical: No cervical adenopathy.  Skin:    General: Skin is warm and dry.     Capillary Refill: Capillary refill takes less than 2 seconds.     Coloration: Skin is not jaundiced or pale.     Findings: No bruising, erythema, lesion or rash.  Neurological:     General: No focal deficit present.     Mental Status: She is alert and oriented to person, place, and time. Mental status is at baseline.     Cranial Nerves: No cranial nerve deficit.     Sensory: No sensory deficit.     Motor: No weakness.     Coordination: Coordination normal.     Gait: Gait normal.     Deep Tendon Reflexes: Reflexes normal.  Psychiatric:        Mood and Affect: Mood normal.        Behavior: Behavior normal.        Thought Content: Thought content normal.        Judgment: Judgment normal.     Results for orders placed or performed in visit on 11/30/18  Urinalysis, Routine w reflex microscopic  Result Value Ref Range  Specific Gravity, UA 1.015 1.005 - 1.030   pH, UA 8.5 (H) 5.0 - 7.5   Color, UA Yellow Yellow   Appearance Ur Clear Clear   Leukocytes,UA Negative Negative   Protein,UA Negative Negative/Trace   Glucose, UA Negative Negative   Ketones, UA Negative Negative   RBC, UA Negative Negative   Bilirubin, UA Negative Negative   Urobilinogen, Ur 0.2 0.2 - 1.0 mg/dL   Nitrite, UA Negative Negative  TSH  Result Value Ref Range   TSH 1.840 0.450 - 4.500 uIU/mL  CBC with Differential/Platelet  Result Value Ref Range   WBC 5.2 3.4 - 10.8 x10E3/uL   RBC 4.80 3.77 - 5.28 x10E6/uL   Hemoglobin 14.7 11.1 - 15.9 g/dL   Hematocrit 42.4 34.0 - 46.6 %   MCV 88 79 - 97 fL   MCH 30.6 26.6 - 33.0 pg   MCHC 34.7 31.5 - 35.7 g/dL   RDW 11.7  11.7 - 15.4 %   Platelets 303 150 - 450 x10E3/uL   Neutrophils 49 Not Estab. %   Lymphs 38 Not Estab. %   Monocytes 8 Not Estab. %   Eos 4 Not Estab. %   Basos 1 Not Estab. %   Neutrophils Absolute 2.6 1.4 - 7.0 x10E3/uL   Lymphocytes Absolute 2.0 0.7 - 3.1 x10E3/uL   Monocytes Absolute 0.4 0.1 - 0.9 x10E3/uL   EOS (ABSOLUTE) 0.2 0.0 - 0.4 x10E3/uL   Basophils Absolute 0.0 0.0 - 0.2 x10E3/uL   Immature Granulocytes 0 Not Estab. %   Immature Grans (Abs) 0.0 0.0 - 0.1 x10E3/uL  Lipid panel  Result Value Ref Range   Cholesterol, Total 251 (H) 100 - 199 mg/dL   Triglycerides 102 0 - 149 mg/dL   HDL 76 >39 mg/dL   VLDL Cholesterol Cal 20 5 - 40 mg/dL   LDL Calculated 155 (H) 0 - 99 mg/dL   Chol/HDL Ratio 3.3 0.0 - 4.4 ratio  Comprehensive metabolic panel  Result Value Ref Range   Glucose 89 65 - 99 mg/dL   BUN 18 8 - 27 mg/dL   Creatinine, Ser 0.87 0.57 - 1.00 mg/dL   GFR calc non Af Amer 71 >59 mL/min/1.73   GFR calc Af Amer 81 >59 mL/min/1.73   BUN/Creatinine Ratio 21 12 - 28   Sodium 142 134 - 144 mmol/L   Potassium 4.5 3.5 - 5.2 mmol/L   Chloride 103 96 - 106 mmol/L   CO2 26 20 - 29 mmol/L   Calcium 9.5 8.7 - 10.3 mg/dL   Total Protein 6.9 6.0 - 8.5 g/dL   Albumin 4.6 3.8 - 4.8 g/dL   Globulin, Total 2.3 1.5 - 4.5 g/dL   Albumin/Globulin Ratio 2.0 1.2 - 2.2   Bilirubin Total 0.5 0.0 - 1.2 mg/dL   Alkaline Phosphatase 75 39 - 117 IU/L   AST 21 0 - 40 IU/L   ALT 15 0 - 32 IU/L      Assessment & Plan:   Problem List Items Addressed This Visit    None    Visit Diagnoses    Routine general medical examination at a health care facility    -  Primary   Vaccines up to date. Screening labs checked today. Referral to GI placed today. Mammogram ordered. Pap N/A. Continue diet and exercise. Call with any concerns.    Flu vaccine need       Flu shot given today.   Relevant Orders   Flu Vaccine QUAD 36+ mos IM (  Completed)   Screening for colon cancer       Wants to see Dr.  Tiffany Kocher. Referral to GI made today.   Relevant Orders   Ambulatory referral to Gastroenterology       Follow up plan: Return As scheduled for January.   LABORATORY TESTING:  - Pap smear: not applicable  IMMUNIZATIONS:   - Tdap: Tetanus vaccination status reviewed: last tetanus booster within 10 years. - Influenza: Administered today - Pneumovax: Not applicable - Prevnar: Not applicable - HPV: Not applicable - Zostavax vaccine: Up to date  SCREENING: -Mammogram: Already scheduled  - Colonoscopy: Up to date   PATIENT COUNSELING:   Advised to take 1 mg of folate supplement per day if capable of pregnancy.   Sexuality: Discussed sexually transmitted diseases, partner selection, use of condoms, avoidance of unintended pregnancy  and contraceptive alternatives.   Advised to avoid cigarette smoking.  I discussed with the patient that most people either abstain from alcohol or drink within safe limits (<=14/week and <=4 drinks/occasion for males, <=7/weeks and <= 3 drinks/occasion for females) and that the risk for alcohol disorders and other health effects rises proportionally with the number of drinks per week and how often a drinker exceeds daily limits.  Discussed cessation/primary prevention of drug use and availability of treatment for abuse.   Diet: Encouraged to adjust caloric intake to maintain  or achieve ideal body weight, to reduce intake of dietary saturated fat and total fat, to limit sodium intake by avoiding high sodium foods and not adding table salt, and to maintain adequate dietary potassium and calcium preferably from fresh fruits, vegetables, and low-fat dairy products.    stressed the importance of regular exercise  Injury prevention: Discussed safety belts, safety helmets, smoke detector, smoking near bedding or upholstery.   Dental health: Discussed importance of regular tooth brushing, flossing, and dental visits.    NEXT PREVENTATIVE PHYSICAL DUE IN 1  YEAR. Return As scheduled for January.

## 2019-01-24 ENCOUNTER — Ambulatory Visit
Admission: RE | Admit: 2019-01-24 | Discharge: 2019-01-24 | Disposition: A | Discharge status: Home / Self Care | Payer: 59 | Source: Ambulatory Visit | Attending: Family Medicine | Admitting: Family Medicine

## 2019-01-24 DIAGNOSIS — Z1231 Encounter for screening mammogram for malignant neoplasm of breast: Secondary | ICD-10-CM | POA: Insufficient documentation

## 2019-05-09 ENCOUNTER — Ambulatory Visit: Payer: Self-pay | Admitting: Family Medicine

## 2019-05-16 ENCOUNTER — Encounter: Payer: Self-pay | Admitting: Family Medicine

## 2019-05-16 ENCOUNTER — Ambulatory Visit (INDEPENDENT_AMBULATORY_CARE_PROVIDER_SITE_OTHER): Payer: 59 | Admitting: Family Medicine

## 2019-05-16 DIAGNOSIS — F339 Major depressive disorder, recurrent, unspecified: Secondary | ICD-10-CM

## 2019-05-16 DIAGNOSIS — F3341 Major depressive disorder, recurrent, in partial remission: Secondary | ICD-10-CM

## 2019-05-16 MED ORDER — LAMOTRIGINE 200 MG PO TABS: ORAL_TABLET | ORAL | 1 refills | Status: DC

## 2019-05-16 MED ORDER — OLANZAPINE 2.5 MG PO TABS: 2.5000 mg | ORAL_TABLET | ORAL | 1 refills | Status: DC

## 2019-05-16 MED ORDER — FLUOXETINE HCL 10 MG PO CAPS: 10.0000 mg | ORAL_CAPSULE | Freq: Every day | ORAL | 1 refills | Status: DC

## 2019-05-16 NOTE — Assessment & Plan Note (Signed)
Under good control on current regimen. Continue current regimen. Continue to monitor. Call with any concerns. Refills given. Will check on lamictal level. Call with any concerns.

## 2019-05-16 NOTE — Progress Notes (Signed)
Ht 5\' 2"  (1.575 m)   Wt 132 lb (59.9 kg)   BMI 24.14 kg/m    Subjective:    Patient ID: Rhonda Reeves, female    DOB: 03/24/1956, 64 y.o.   MRN: [id number]  HPI: [name] is a 64 y.o. female  Chief Complaint  Patient presents with  . Depression  . Anxiety   ANXIETY/DEPRESSION Duration:controlled Anxious mood: no  Excessive worrying: no Irritability: no  Sweating: no Nausea: no Palpitations:no Hyperventilation: no Panic attacks: no Agoraphobia: no  Obscessions/compulsions: no Depressed mood: no Depression screen [facility] 2/9 05/16/2019 01/10/2019 01/20/2018 03/31/2017 01/30/2016  Decreased Interest 0 0 0 0 0  Down, Depressed, Hopeless 0 0 0 0 0  PHQ - 2 Score 0 0 0 0 0  Altered sleeping 0 - 1 - -  Tired, decreased energy 0 - 1 - -  Change in appetite 0 - 0 - -  Feeling bad or failure about yourself  0 - 0 - -  Trouble concentrating 0 - 0 - -  Moving slowly or fidgety/restless 0 - 0 - -  Suicidal thoughts 0 - 0 - -  PHQ-9 Score 0 - 2 - -   GAD 7 : Generalized Anxiety Score 05/16/2019 01/10/2019  Nervous, Anxious, on Edge 0 0  Control/stop worrying 0 0  Worry too much - different things 0 0  Trouble relaxing 0 0  Restless 0 0  Easily annoyed or irritable 0 1  Afraid - awful might happen 0 0  Total GAD 7 Score 0 1  Anxiety Difficulty - Not difficult at all   Anhedonia: no Weight changes: no Insomnia: no   Hypersomnia: no Fatigue/loss of energy: no Feelings of worthlessness: no Feelings of guilt: no Impaired concentration/indecisiveness: no Suicidal ideations: no  Crying spells: no Recent Stressors/Life Changes: no  Relevant past medical, surgical, family and social history reviewed and updated as indicated. Interim medical history since our last visit reviewed. Allergies and medications reviewed and updated.  Review of Systems  Constitutional: Negative.   Respiratory: Negative.   Cardiovascular: Negative.   Gastrointestinal: Negative.     Musculoskeletal: Negative.   Neurological: Negative.   Psychiatric/Behavioral: Negative.     Per HPI unless specifically indicated above     Objective:    Ht 5\' 2"  (1.575 m)   Wt 132 lb (59.9 kg)   BMI 24.14 kg/m   Wt Readings from Last 3 Encounters:  05/16/19 132 lb (59.9 kg)  01/10/19 134 lb (60.8 kg)  03/22/18 132 lb (59.9 kg)    Physical Exam Vitals and nursing note reviewed.  Pulmonary:     Effort: Pulmonary effort is normal. No respiratory distress.     Comments: Speaking in full sentences Neurological:     Mental Status: She is alert.  Psychiatric:        Mood and Affect: Mood normal.        Behavior: Behavior normal.        Thought Content: Thought content normal.        Judgment: Judgment normal.     Results for orders placed or performed in visit on 11/30/18  Urinalysis, Routine w reflex microscopic  Result Value Ref Range   Specific Gravity, UA 1.015 1.005 - 1.030   pH, UA 8.5 (H) 5.0 - 7.5   Color, UA Yellow Yellow   Appearance Ur Clear Clear   Leukocytes,UA Negative Negative   Protein,UA Negative Negative/Trace   Glucose, UA Negative Negative   Ketones,  UA Negative Negative   RBC, UA Negative Negative   Bilirubin, UA Negative Negative   Urobilinogen, Ur 0.2 0.2 - 1.0 mg/dL   Nitrite, UA Negative Negative  TSH  Result Value Ref Range   TSH 1.840 0.450 - 4.500 uIU/mL  CBC with Differential/Platelet  Result Value Ref Range   WBC 5.2 3.4 - 10.8 x10E3/uL   RBC 4.80 3.77 - 5.28 x10E6/uL   Hemoglobin 14.7 11.1 - 15.9 g/dL   Hematocrit 42.4 34.0 - 46.6 %   MCV 88 79 - 97 fL   MCH 30.6 26.6 - 33.0 pg   MCHC 34.7 31.5 - 35.7 g/dL   RDW 11.7 11.7 - 15.4 %   Platelets 303 150 - 450 x10E3/uL   Neutrophils 49 Not Estab. %   Lymphs 38 Not Estab. %   Monocytes 8 Not Estab. %   Eos 4 Not Estab. %   Basos 1 Not Estab. %   Neutrophils Absolute 2.6 1.4 - 7.0 x10E3/uL   Lymphocytes Absolute 2.0 0.7 - 3.1 x10E3/uL   Monocytes Absolute 0.4 0.1 - 0.9  x10E3/uL   EOS (ABSOLUTE) 0.2 0.0 - 0.4 x10E3/uL   Basophils Absolute 0.0 0.0 - 0.2 x10E3/uL   Immature Granulocytes 0 Not Estab. %   Immature Grans (Abs) 0.0 0.0 - 0.1 x10E3/uL  Lipid panel  Result Value Ref Range   Cholesterol, Total 251 (H) 100 - 199 mg/dL   Triglycerides 102 0 - 149 mg/dL   HDL 76 >39 mg/dL   VLDL Cholesterol Cal 20 5 - 40 mg/dL   LDL Calculated 155 (H) 0 - 99 mg/dL   Chol/HDL Ratio 3.3 0.0 - 4.4 ratio  Comprehensive metabolic panel  Result Value Ref Range   Glucose 89 65 - 99 mg/dL   BUN 18 8 - 27 mg/dL   Creatinine, Ser 0.87 0.57 - 1.00 mg/dL   GFR calc non Af Amer 71 >59 mL/min/1.73   GFR calc Af Amer 81 >59 mL/min/1.73   BUN/Creatinine Ratio 21 12 - 28   Sodium 142 134 - 144 mmol/L   Potassium 4.5 3.5 - 5.2 mmol/L   Chloride 103 96 - 106 mmol/L   CO2 26 20 - 29 mmol/L   Calcium 9.5 8.7 - 10.3 mg/dL   Total Protein 6.9 6.0 - 8.5 g/dL   Albumin 4.6 3.8 - 4.8 g/dL   Globulin, Total 2.3 1.5 - 4.5 g/dL   Albumin/Globulin Ratio 2.0 1.2 - 2.2   Bilirubin Total 0.5 0.0 - 1.2 mg/dL   Alkaline Phosphatase 75 39 - 117 IU/L   AST 21 0 - 40 IU/L   ALT 15 0 - 32 IU/L      Assessment & Plan:   Problem List Items Addressed This Visit      Other   Depression, recurrent (HCC)    Under good control on current regimen. Continue current regimen. Continue to monitor. Call with any concerns. Refills given. Will check on lamictal level. Call with any concerns.        Relevant Medications   FLUoxetine (PROZAC) 10 MG capsule   lamoTRIgine (LAMICTAL) 200 MG tablet   OLANZapine (ZYPREXA) 2.5 MG tablet    Other Visit Diagnoses    Recurrent major depressive disorder, in partial remission (HCC)       Relevant Medications   FLUoxetine (PROZAC) 10 MG capsule   lamoTRIgine (LAMICTAL) 200 MG tablet   OLANZapine (ZYPREXA) 2.5 MG tablet   Other Relevant Orders   Lamotrigine level  Follow up plan: Return in about 6 months (around 11/13/2019) for  physical.    . This visit was completed via telephone due to the restrictions of the COVID-19 pandemic. All issues as above were discussed and addressed but no physical exam was performed. If it was felt that the patient should be evaluated in the office, they were directed there. The patient verbally consented to this visit. Patient was unable to complete an audio/visual visit due to Lack of equipment. Due to the catastrophic nature of the COVID-19 pandemic, this visit was done through audio contact only. . Location of the patient: work . Location of the provider: work . Those involved with this call:  . Provider: Park Liter, DO . CMA: Lesle Chris, [place] . Front Desk/Registration: Don Perking  . Time spent on call: 15 minutes on the phone discussing health concerns. 23 minutes total spent in review of patient's record and preparation of their chart.

## 2019-05-26 ENCOUNTER — Encounter: Payer: Self-pay | Admitting: Family Medicine

## 2019-06-03 ENCOUNTER — Telehealth: Payer: Self-pay

## 2019-06-03 NOTE — Telephone Encounter (Signed)
Routing to provider  

## 2019-06-07 ENCOUNTER — Telehealth: Payer: Self-pay

## 2019-06-07 NOTE — Telephone Encounter (Signed)
Patient notified

## 2019-06-07 NOTE — Telephone Encounter (Signed)
Called and spoke with patient, she states that she had them done at [facility] on 05/26/19, reached out to [facility] [place] in [place], she will get these results and give them to [place] for review.   Copied from [place] [phone]. Topic: General - Other >> Jun 03, 2019 11:53 AM Greggory Keen D wrote: Reason for CRM: Pt is calling regarding the labs she had done last week.  She has not heard anything back yet.  Pt requested to talk to Dr. Wynetta Emery CB#  [id number] >> Jun 06, 2019 11:38 AM Andria Frames L wrote: Pt calling back. Pt would like a call back. [phone] please call before 4  >> Jun 03, 2019  4:38 PM Georgina Peer, [place] wrote: Called and left VM asking for patient to please return my call. No labs results in chart. Has a future order that does not appear to have been drawn yet. See if patient had labs elsewhere if she calls back.

## 2019-06-07 NOTE — Telephone Encounter (Signed)
Looks like you've been seeing her

## 2019-06-07 NOTE — Telephone Encounter (Signed)
Result in your folder

## 2019-07-18 LAB — HM COLONOSCOPY

## 2019-07-23 ENCOUNTER — Ambulatory Visit: Payer: 59 | Attending: Internal Medicine

## 2019-07-23 DIAGNOSIS — Z23 Encounter for immunization: Secondary | ICD-10-CM

## 2019-07-23 NOTE — Progress Notes (Signed)
   Covid-19 Vaccination Clinic  Name:  Rhonda Reeves    MRN: [id number] DOB: 1955-05-30  07/23/2019  Rhonda Reeves was observed post Covid-19 immunization for 15 minutes without incident. She was provided with Vaccine Information Sheet and instruction to access the V-Safe system.   Rhonda Reeves was instructed to call 911 with any severe reactions post vaccine: Marland Kitchen Difficulty breathing  . Swelling of face and throat  . A fast heartbeat  . A bad rash all over body  . Dizziness and weakness   Immunizations Administered    Name Date Dose VIS Date Route   Pfizer COVID-19 Vaccine 07/23/2019  9:55 AM 0.3 mL 04/01/2019 Intramuscular   Manufacturer: [place]   Lot: [id number]   [place]: [id number]

## 2019-07-28 ENCOUNTER — Other Ambulatory Visit: Payer: Self-pay

## 2019-08-01 ENCOUNTER — Other Ambulatory Visit: Payer: Self-pay

## 2019-08-01 ENCOUNTER — Encounter: Payer: Self-pay | Admitting: Surgery

## 2019-08-01 ENCOUNTER — Encounter: Payer: Self-pay | Admitting: Family Medicine

## 2019-08-01 ENCOUNTER — Ambulatory Visit (INDEPENDENT_AMBULATORY_CARE_PROVIDER_SITE_OTHER): Payer: 59 | Admitting: Family Medicine

## 2019-08-01 VITALS — BP 128/86 | HR 71 | Temp 98.3°F | Ht 61.5 in | Wt 137.5 lb

## 2019-08-01 DIAGNOSIS — F3341 Major depressive disorder, recurrent, in partial remission: Secondary | ICD-10-CM

## 2019-08-01 DIAGNOSIS — R198 Other specified symptoms and signs involving the digestive system and abdomen: Secondary | ICD-10-CM

## 2019-08-01 DIAGNOSIS — F3175 Bipolar disorder, in partial remission, most recent episode depressed: Secondary | ICD-10-CM

## 2019-08-01 DIAGNOSIS — F339 Major depressive disorder, recurrent, unspecified: Secondary | ICD-10-CM | POA: Diagnosis not present

## 2019-08-01 DIAGNOSIS — H9319 Tinnitus, unspecified ear: Secondary | ICD-10-CM | POA: Insufficient documentation

## 2019-08-01 DIAGNOSIS — F319 Bipolar disorder, unspecified: Secondary | ICD-10-CM | POA: Insufficient documentation

## 2019-08-01 NOTE — Patient Instructions (Addendum)
Olanzepine  - go to every other day dosing - if no change in how you feel after 2-3 weeks can try stopping - if you notice, your bipolar symptoms return you can restart and if you are still motivated to change medication we can refer to psychiatry - MyChart in 1 month to check in or call   Core Strength Exercises  Core exercises help to build strength in the muscles between your ribs and your hips (abdominal muscles). These muscles help to support your body and keep your spine stable. It is important to maintain strength in your core to prevent injury and pain. Some activities, such as yoga and Pilates, can help to strengthen core muscles. You can also strengthen core muscles with exercises at home. It is important to talk to your health care provider before you start a new exercise routine. What are the benefits of core strength exercises? Core strength exercises can:  Reduce back pain.  Help to rebuild strength after a back or spine injury.  Help to prevent injury during physical activity, especially injuries to the back and knees. How to do core strength exercises Repeat these exercises 10-15 times, or until you are tired. Do exercises exactly as told by your health care provider and adjust them as directed. It is normal to feel mild stretching, pulling, tightness, or discomfort as you do these exercises. If you feel any pain while doing these exercises, stop. If your pain continues or gets worse when doing core exercises, contact your health care provider. You may want to use a padded yoga or exercise mat for strength exercises that are done on the floor. Bridging  1. Lie on your back on a firm surface with your knees bent and your feet flat on the floor. 2. Raise your hips so that your knees, hips, and shoulders form a straight line together. Keep your abdominal muscles tight. 3. Hold this position for 3-5 seconds. 4. Slowly lower your hips to the starting position. 5. Let your  muscles relax completely between repetitions. Single-leg bridge 1. Lie on your back on a firm surface with your knees bent and your feet flat on the floor. 2. Raise your hips so that your knees, hips, and shoulders form a straight line together. Keep your abdominal muscles tight. 3. Lift one foot off the floor, then completely straighten that leg. 4. Hold this position for 3-5 seconds. 5. Put the straight leg back down in the bent position. 6. Slowly lower your hips to the starting position. 7. Repeat these steps using your other leg. Side bridge 1. Lie on your side with your knees bent. Prop yourself up on the elbow that is near the floor. 2. Using your abdominal muscles and your elbow that is on the floor, raise your body off the floor. Raise your hip so that your shoulder, hip, and foot form a straight line together. 3. Hold this position for 10 seconds. Keep your head and neck raised and away from your shoulder (in their normal, neutral position). Keep your abdominal muscles tight. 4. Slowly lower your hip to the starting position. 5. Repeat and try to hold this position longer, working your way up to 30 seconds. Bird dog 1. Get on your hands and knees, with your legs shoulder-width apart and your arms under your shoulders. Keep your back straight. 2. Tighten your abdominal muscles. 3. Raise one of your legs off the floor and straighten it. Try to keep it parallel to the floor. 4. Slowly  lower your leg to the starting position. 5. Raise one of your arms off the floor and straighten it. Try to keep it parallel to the floor. 6. Slowly lower your arm to the starting position. 7. Repeat with the other arm and leg. If possible, try raising a leg and arm at the same time, on opposite sides of the body. For example, raise your left hand and your right leg. Plank 1. Lie on your belly. 2. Prop up your body onto your forearms and your feet, keeping your legs straight. Your body should make a  straight line between your shoulders and feet. 3. Hold this position for 10 seconds while keeping your abdominal muscles tight. 4. Lower your body to the starting position. 5. Repeat and try to hold this position longer, working your way up to 30 seconds. Cross-core strengthening 1. Stand with your feet shoulder-width apart. 2. Hold a ball out in front of you. Keep your arms straight. 3. Tighten your abdominal muscles and slowly rotate at your waist from side to side. Keep your feet flat. 4. Once you are comfortable, try repeating this exercise with a heavier ball. Top core strengthening 1. Stand about 18 inches (46 cm) in front of a wall, with your back to the wall. 2. Keep your feet flat and shoulder-width apart. 3. Tighten your abdominal muscles. 4. Bend your hips and knees. 5. Slowly reach between your legs to touch the wall behind you. 6. Slowly stand back up. 7. Raise your arms over your head and reach behind you. 8. Return to the starting position. General tips  Do not do any exercises that cause pain. If you have pain while exercising, talk to your health care provider.  Always stretch before and after doing these exercises. This can help prevent injury.  Maintain a healthy weight. Ask your health care provider what weight is healthy for you. Contact a health care provider if:  You have back pain that gets worse or does not go away.  You feel pain while doing core strength exercises. Get help right away if:  You have severe pain that does not get better with medicine. Summary  Core exercises help to build strength in the muscles between your ribs and your waist.  Core muscles help to support your body and keep your spine stable.  Some activities, such as yoga and Pilates, can help to strengthen core muscles.  Core strength exercises can help back pain and can prevent injury.  If you feel any pain while doing core strength exercises, stop. This information is not  intended to replace advice given to you by your health care provider. Make sure you discuss any questions you have with your health care provider. Document Revised: 07/28/2018 Document Reviewed: 08/27/2016 Elsevier Patient Education  [place].

## 2019-08-01 NOTE — Assessment & Plan Note (Signed)
Pt with hx of bipolar and last manic episode 2006. She has been tapering olanzapine with success so she will cut back to every other day x 2 weeks and then stop. Call/mychart to check in, in 4 weeks. If symptoms worsen and still wanting to switch medications discussed referral to psych for close monitoring and management. Cont lamictal and fluoxetine.

## 2019-08-01 NOTE — Assessment & Plan Note (Signed)
Suspect body appearance concern is more related to poor posture and core weakness. Hand out for exercises provided. Encouraged her continued walking routine and healthy eating.

## 2019-08-01 NOTE — Progress Notes (Signed)
Subjective:     Rhonda Reeves is a 64 y.o. female presenting for [place] (previous PCP Dr Jeananne Rama and then Park Liter at the same clinic.), Medication Management (discuss stopping Olanzapine), and Weight management     HPI  #depression - hx of bipolar - last manic episode was 2006 - interested in stopping the olanzapine - she feels she is doing well and this medication is expensive - tolerating the lamictal w/o issues - initially started on 5 mg and decreased to 2.5 mg - no changes in mood with dose decrease -   #weight management - eating healthy diet - exercising (walking) daily - actual concern is her abdomen sticking out   Review of Systems   Social History   Tobacco Use  Smoking Status Former Smoker  . Packs/day: 1.00  . Years: 35.00  . Pack years: 35.00  . Types: Cigarettes  . Quit date: 03/03/2005  . Years since quitting: 14.4  Smokeless Tobacco Never Used        Objective:    BP Readings from Last 3 Encounters:  08/01/19 128/86  01/10/19 129/78  03/22/18 128/78   Wt Readings from Last 3 Encounters:  08/01/19 137 lb 8 oz (62.4 kg)  05/16/19 132 lb (59.9 kg)  01/10/19 134 lb (60.8 kg)    BP 128/86   Pulse 71   Temp 98.3 F (36.8 C)   Ht 5' 1.5" (1.562 m)   Wt 137 lb 8 oz (62.4 kg)   SpO2 98%   BMI 25.56 kg/m    Physical Exam Constitutional:      General: She is not in acute distress.    Appearance: She is well-developed. She is not diaphoretic.  HENT:     Right Ear: External ear normal.     Left Ear: External ear normal.     Nose: Nose normal.  Eyes:     Conjunctiva/sclera: Conjunctivae normal.  Cardiovascular:     Rate and Rhythm: Normal rate and regular rhythm.     Heart sounds: No murmur.  Pulmonary:     Effort: Pulmonary effort is normal. No respiratory distress.     Breath sounds: Normal breath sounds. No wheezing.  Abdominal:     General: Abdomen is flat. Bowel sounds are normal. There is no distension.      Tenderness: There is no abdominal tenderness.     Comments: No diastasis  Musculoskeletal:     Cervical back: Neck supple.  Skin:    General: Skin is warm and dry.     Capillary Refill: Capillary refill takes less than 2 seconds.  Neurological:     Mental Status: She is alert. Mental status is at baseline.  Psychiatric:        Mood and Affect: Mood normal.        Behavior: Behavior normal.           Assessment & Plan:   Problem List Items Addressed This Visit      Other   Depression, recurrent ([place])   Bipolar disorder ([place]) - Primary    Pt with hx of bipolar and last manic episode 2006. She has been tapering olanzapine with success so she will cut back to every other day x 2 weeks and then stop. Call/mychart to check in, in 4 weeks. If symptoms worsen and still wanting to switch medications discussed referral to psych for close monitoring and management. Cont lamictal and fluoxetine.       Recurrent major depressive disorder,  in partial remission (HCC)   Abdominal weakness    Suspect body appearance concern is more related to poor posture and core weakness. Hand out for exercises provided. Encouraged her continued walking routine and healthy eating.           Return in about 5 months (around 01/01/2020) for annual or sooner .  Lesleigh Noe, MD

## 2019-08-02 ENCOUNTER — Other Ambulatory Visit: Payer: Self-pay | Admitting: Family Medicine

## 2019-08-02 DIAGNOSIS — Z1231 Encounter for screening mammogram for malignant neoplasm of breast: Secondary | ICD-10-CM

## 2019-08-17 ENCOUNTER — Ambulatory Visit: Payer: 59 | Attending: Internal Medicine

## 2019-08-17 DIAGNOSIS — Z23 Encounter for immunization: Secondary | ICD-10-CM

## 2019-08-17 NOTE — Progress Notes (Signed)
   Covid-19 Vaccination Clinic  Name:  [name]    MRN: [id number] DOB: 1955-06-11  08/17/2019  [name] was observed post Covid-19 immunization for 15 minutes without incident. She was provided with Vaccine Information Sheet and instruction to access the V-Safe system.   [name] was instructed to call 911 with any severe reactions post vaccine: Marland Kitchen Difficulty breathing  . Swelling of face and throat  . A fast heartbeat  . A bad rash all over body  . Dizziness and weakness   Immunizations Administered    Name Date Dose VIS Date Route   Pfizer COVID-19 Vaccine 08/17/2019  4:01 PM 0.3 mL 06/15/2018 Intramuscular   Manufacturer: [place]   Lot: [id number]   [place]: [id number]

## 2019-08-21 ENCOUNTER — Encounter: Payer: Self-pay | Admitting: Family Medicine

## 2019-09-08 DIAGNOSIS — Z01812 Encounter for preprocedural laboratory examination: Secondary | ICD-10-CM | POA: Diagnosis not present

## 2019-09-13 DIAGNOSIS — K6389 Other specified diseases of intestine: Secondary | ICD-10-CM | POA: Diagnosis not present

## 2019-09-13 DIAGNOSIS — D120 Benign neoplasm of cecum: Secondary | ICD-10-CM | POA: Diagnosis not present

## 2019-09-13 DIAGNOSIS — Z1211 Encounter for screening for malignant neoplasm of colon: Secondary | ICD-10-CM | POA: Diagnosis not present

## 2019-09-13 DIAGNOSIS — Z86010 Personal history of colonic polyps: Secondary | ICD-10-CM | POA: Diagnosis not present

## 2019-09-13 DIAGNOSIS — K641 Second degree hemorrhoids: Secondary | ICD-10-CM | POA: Diagnosis not present

## 2019-09-13 DIAGNOSIS — K5730 Diverticulosis of large intestine without perforation or abscess without bleeding: Secondary | ICD-10-CM | POA: Diagnosis not present

## 2019-09-13 DIAGNOSIS — K635 Polyp of colon: Secondary | ICD-10-CM | POA: Diagnosis not present

## 2019-09-13 DIAGNOSIS — D125 Benign neoplasm of sigmoid colon: Secondary | ICD-10-CM | POA: Diagnosis not present

## 2019-09-13 DIAGNOSIS — D122 Benign neoplasm of ascending colon: Secondary | ICD-10-CM | POA: Diagnosis not present

## 2019-10-30 ENCOUNTER — Encounter: Payer: Self-pay | Admitting: Family Medicine

## 2019-11-03 DIAGNOSIS — R69 Illness, unspecified: Secondary | ICD-10-CM | POA: Diagnosis not present

## 2019-11-17 DIAGNOSIS — C50111 Malignant neoplasm of central portion of right female breast: Secondary | ICD-10-CM | POA: Diagnosis not present

## 2019-11-18 ENCOUNTER — Encounter: Payer: Self-pay | Admitting: Family Medicine

## 2019-11-30 ENCOUNTER — Encounter (INDEPENDENT_AMBULATORY_CARE_PROVIDER_SITE_OTHER): Payer: Medicare HMO | Admitting: Ophthalmology

## 2019-11-30 ENCOUNTER — Other Ambulatory Visit: Payer: Self-pay

## 2019-11-30 DIAGNOSIS — H35342 Macular cyst, hole, or pseudohole, left eye: Secondary | ICD-10-CM | POA: Diagnosis not present

## 2019-11-30 DIAGNOSIS — H2513 Age-related nuclear cataract, bilateral: Secondary | ICD-10-CM

## 2019-11-30 DIAGNOSIS — H35373 Puckering of macula, bilateral: Secondary | ICD-10-CM | POA: Diagnosis not present

## 2019-11-30 DIAGNOSIS — H43813 Vitreous degeneration, bilateral: Secondary | ICD-10-CM | POA: Diagnosis not present

## 2020-01-10 ENCOUNTER — Encounter: Payer: Self-pay | Admitting: Family Medicine

## 2020-01-10 ENCOUNTER — Other Ambulatory Visit: Payer: Self-pay

## 2020-01-10 ENCOUNTER — Ambulatory Visit (INDEPENDENT_AMBULATORY_CARE_PROVIDER_SITE_OTHER): Payer: Medicare HMO | Admitting: Family Medicine

## 2020-01-10 VITALS — BP 118/80 | HR 90 | Temp 98.0°F | Ht 61.5 in | Wt 135.5 lb

## 2020-01-10 DIAGNOSIS — F3341 Major depressive disorder, recurrent, in partial remission: Secondary | ICD-10-CM

## 2020-01-10 DIAGNOSIS — E785 Hyperlipidemia, unspecified: Secondary | ICD-10-CM | POA: Insufficient documentation

## 2020-01-10 DIAGNOSIS — L989 Disorder of the skin and subcutaneous tissue, unspecified: Secondary | ICD-10-CM | POA: Insufficient documentation

## 2020-01-10 DIAGNOSIS — R69 Illness, unspecified: Secondary | ICD-10-CM | POA: Diagnosis not present

## 2020-01-10 DIAGNOSIS — F3175 Bipolar disorder, in partial remission, most recent episode depressed: Secondary | ICD-10-CM | POA: Diagnosis not present

## 2020-01-10 DIAGNOSIS — Z23 Encounter for immunization: Secondary | ICD-10-CM | POA: Diagnosis not present

## 2020-01-10 DIAGNOSIS — E782 Mixed hyperlipidemia: Secondary | ICD-10-CM

## 2020-01-10 LAB — COMPREHENSIVE METABOLIC PANEL
ALT: 17 U/L (ref 0–35)
AST: 21 U/L (ref 0–37)
Albumin: 4.5 g/dL (ref 3.5–5.2)
Alkaline Phosphatase: 72 U/L (ref 39–117)
BUN: 13 mg/dL (ref 6–23)
CO2: 29 mEq/L (ref 19–32)
Calcium: 9.3 mg/dL (ref 8.4–10.5)
Chloride: 105 mEq/L (ref 96–112)
Creatinine, Ser: 0.87 mg/dL (ref 0.40–1.20)
GFR: 65.28 mL/min (ref >60.00)
Glucose, Bld: 90 mg/dL (ref 70–99)
Potassium: 3.9 mEq/L (ref 3.5–5.1)
Sodium: 141 mEq/L (ref 135–145)
Total Bilirubin: 0.6 mg/dL (ref 0.2–1.2)
Total Protein: 7.1 g/dL (ref 6.0–8.3)

## 2020-01-10 LAB — LIPID PANEL
Cholesterol: 220 mg/dL — ABNORMAL HIGH (ref 0–200)
HDL: 73.6 mg/dL (ref >39.00)
LDL Cholesterol: 126 mg/dL — ABNORMAL HIGH (ref 0–99)
NonHDL: 146.24
Total CHOL/HDL Ratio: 3
Triglycerides: 99 mg/dL (ref 0.0–149.0)
VLDL: 19.8 mg/dL (ref 0.0–40.0)

## 2020-01-10 MED ORDER — OLANZAPINE 2.5 MG PO TABS: 2.5000 mg | ORAL_TABLET | ORAL | 1 refills | Status: DC

## 2020-01-10 MED ORDER — LAMOTRIGINE 200 MG PO TABS: ORAL_TABLET | ORAL | 1 refills | Status: DC

## 2020-01-10 MED ORDER — FLUOXETINE HCL 10 MG PO CAPS: 10.0000 mg | ORAL_CAPSULE | Freq: Every day | ORAL | 1 refills | Status: DC

## 2020-01-10 NOTE — Assessment & Plan Note (Signed)
Firm nodule. Wonder about possible basel cell with new appearance and growth but given appearance also wonder about keloid or other lesion. Given face location advised dermatology for assessment and treatment recommendations.

## 2020-01-10 NOTE — Progress Notes (Signed)
Subjective:     Rhonda Reeves is a 64 y.o. female presenting for Mass (on left cheek x 6 months )     HPI  #Mass on cheek - not painful - present for 6 months - seems to be getting bigger  #Bipolar - doing well on current regimen   Review of Systems   Social History   Tobacco Use  Smoking Status Former Smoker  . Packs/day: 1.00  . Years: 35.00  . Pack years: 35.00  . Types: Cigarettes  . Quit date: 03/03/2005  . Years since quitting: 14.8  Smokeless Tobacco Never Used        Objective:    BP Readings from Last 3 Encounters:  01/10/20 118/80  08/01/19 128/86  01/10/19 129/78   Wt Readings from Last 3 Encounters:  01/10/20 135 lb 8 oz (61.5 kg)  08/01/19 137 lb 8 oz (62.4 kg)  05/16/19 132 lb (59.9 kg)    BP 118/80   Pulse 90   Temp 98 F (36.7 C) (Temporal)   Ht 5' 1.5" (1.562 m)   Wt 135 lb 8 oz (61.5 kg)   SpO2 96%   BMI 25.19 kg/m    Physical Exam Constitutional:      General: She is not in acute distress.    Appearance: She is well-developed. She is not diaphoretic.  HENT:     Right Ear: External ear normal.     Left Ear: External ear normal.  Eyes:     Conjunctiva/sclera: Conjunctivae normal.  Cardiovascular:     Rate and Rhythm: Normal rate and regular rhythm.     Heart sounds: No murmur heard.   Pulmonary:     Effort: Pulmonary effort is normal. No respiratory distress.     Breath sounds: Normal breath sounds. No wheezing.  Musculoskeletal:     Cervical back: Neck supple.  Skin:    General: Skin is warm and dry.     Capillary Refill: Capillary refill takes less than 2 seconds.     Comments: Left cheek with raised yellow/white papule with mild erythema at the base. 5 mm in size  Neurological:     Mental Status: She is alert. Mental status is at baseline.  Psychiatric:        Mood and Affect: Mood normal.        Behavior: Behavior normal.           Assessment & Plan:   Problem List Items Addressed This Visit       Musculoskeletal and Integument   Skin lesion of cheek - Primary    Firm nodule. Wonder about possible basel cell with new appearance and growth but given appearance also wonder about keloid or other lesion. Given face location advised dermatology for assessment and treatment recommendations.       Relevant Orders   Ambulatory referral to Dermatology     Other   Bipolar disorder [facility])    Was not able to tolerate stopping olanzapine. Feels stable on her current doses. No need for psych. Cont medications. Will get monitoring blood work. Discussed CBC every other year for olanzapine.       Relevant Medications   FLUoxetine (PROZAC) 10 MG capsule   lamoTRIgine (LAMICTAL) 200 MG tablet   OLANZapine (ZYPREXA) 2.5 MG tablet   Other Relevant Orders   Comprehensive metabolic panel   Lipid panel   Recurrent major depressive disorder, in partial remission (HCC)   Relevant Medications   FLUoxetine (PROZAC) 10 MG  capsule   lamoTRIgine (LAMICTAL) 200 MG tablet   OLANZapine (ZYPREXA) 2.5 MG tablet   Hyperlipidemia    ASCVD 4.7% by last year. No need for medication. Repeat.        Relevant Orders   Comprehensive metabolic panel   Lipid panel    Other Visit Diagnoses    Need for influenza vaccination       Relevant Orders   Flu Vaccine QUAD High Dose(Fluad) (Completed)       Return for wellness visit.  Lesleigh Noe, MD  This visit occurred during the SARS-CoV-2 public health emergency.  Safety protocols were in place, including screening questions prior to the visit, additional usage of staff PPE, and extensive cleaning of exam room while observing appropriate contact time as indicated for disinfecting solutions.

## 2020-01-10 NOTE — Assessment & Plan Note (Signed)
ASCVD 4.7% by last year. No need for medication. Repeat.

## 2020-01-10 NOTE — Patient Instructions (Addendum)
Skin lesion - dermatology referral  Medications refilled  Return for wellness visit  #Referral I have placed a referral to a specialist for you. You should receive a phone call from the specialty office. Make sure your voicemail is not full and that if you are able to answer your phone to unknown or new numbers.   It may take up to 2 weeks to hear about the referral. If you do not hear anything in 2 weeks, please call our office and ask to speak with the referral coordinator.

## 2020-01-10 NOTE — Assessment & Plan Note (Addendum)
Was not able to tolerate stopping olanzapine. Feels stable on her current doses. No need for psych. Cont medications. Will get monitoring blood work. Discussed CBC every other year for olanzapine.

## 2020-01-11 ENCOUNTER — Encounter (INDEPENDENT_AMBULATORY_CARE_PROVIDER_SITE_OTHER): Payer: PRIVATE HEALTH INSURANCE | Admitting: Ophthalmology

## 2020-01-16 ENCOUNTER — Encounter (INDEPENDENT_AMBULATORY_CARE_PROVIDER_SITE_OTHER): Payer: PRIVATE HEALTH INSURANCE | Admitting: Ophthalmology

## 2020-01-16 ENCOUNTER — Encounter: Payer: Self-pay | Admitting: Family Medicine

## 2020-01-20 DIAGNOSIS — L728 Other follicular cysts of the skin and subcutaneous tissue: Secondary | ICD-10-CM | POA: Diagnosis not present

## 2020-01-26 ENCOUNTER — Other Ambulatory Visit: Payer: Self-pay

## 2020-01-26 ENCOUNTER — Ambulatory Visit
Admission: RE | Admit: 2020-01-26 | Discharge: 2020-01-26 | Disposition: A | Discharge status: Home / Self Care | Payer: Medicare HMO | Source: Ambulatory Visit | Attending: Family Medicine | Admitting: Family Medicine

## 2020-01-26 DIAGNOSIS — Z1231 Encounter for screening mammogram for malignant neoplasm of breast: Secondary | ICD-10-CM | POA: Insufficient documentation

## 2020-02-01 DIAGNOSIS — R69 Illness, unspecified: Secondary | ICD-10-CM | POA: Diagnosis not present

## 2020-02-02 ENCOUNTER — Ambulatory Visit (INDEPENDENT_AMBULATORY_CARE_PROVIDER_SITE_OTHER): Payer: Medicare HMO | Admitting: Family Medicine

## 2020-02-02 ENCOUNTER — Other Ambulatory Visit: Payer: Self-pay

## 2020-02-02 ENCOUNTER — Encounter: Payer: Self-pay | Admitting: Family Medicine

## 2020-02-02 VITALS — BP 122/78 | HR 70 | Temp 98.1°F | Ht 62.0 in | Wt 139.5 lb

## 2020-02-02 DIAGNOSIS — E2839 Other primary ovarian failure: Secondary | ICD-10-CM

## 2020-02-02 DIAGNOSIS — Z0000 Encounter for general adult medical examination without abnormal findings: Secondary | ICD-10-CM | POA: Diagnosis not present

## 2020-02-02 DIAGNOSIS — Z136 Encounter for screening for cardiovascular disorders: Secondary | ICD-10-CM

## 2020-02-02 NOTE — Patient Instructions (Signed)
You can call for a Dexa at these locations:  [facility] at [facility].  [place]) 800 units of Vitamin D daily 2) Get 1200 mg of elemental calcium --- this is best from your diet. Try to track how much calcium you get on a typical day. You could find ways to add more (dairy products, leafy greens). Take a supplement for whatever you don't typically get so you reach 1200 mg of calcium.  3) Physical activity (ideally weight bearing) - like walking briskly 30 minutes 5 days a week.

## 2020-02-02 NOTE — Progress Notes (Addendum)
Subjective:    Rhonda Reeves is a 64 y.o. female who presents for a Welcome to Medicare exam.   Review of Systems Review of Systems  Constitutional: Negative for chills and fever.  HENT: Negative for congestion and sore throat.   Eyes: Negative for blurred vision and double vision.  Respiratory: Negative for shortness of breath.   Cardiovascular: Negative for chest pain.  Gastrointestinal: Negative for heartburn, nausea and vomiting.  Genitourinary: Negative.   Musculoskeletal: Negative.  Negative for myalgias.  Skin: Negative for rash.  Neurological: Negative for dizziness and headaches.  Endo/Heme/Allergies: Does not bruise/bleed easily.  Psychiatric/Behavioral: Negative for depression. The patient is not nervous/anxious.     Cardiac Risk Factors include: advanced age (>58men, >34 women);dyslipidemia;smoking/ tobacco exposure      Objective:    Today's Vitals   02/02/20 1606  BP: 122/78  Pulse: 70  Temp: 98.1 F (36.7 C)  TempSrc: Temporal  SpO2: 97%  Weight: 139 lb 8 oz (63.3 kg)  Height: 5\' 2"  (1.575 m)  Body mass index is 25.51 kg/m.  Medications Outpatient Encounter Medications as of 02/02/2020  Medication Sig  . Calcium Carb-Cholecalciferol (CALCIUM 600 + D PO) Take 1 tablet by mouth daily.  Marland Kitchen FLUoxetine (PROZAC) 10 MG capsule Take 1 capsule (10 mg total) by mouth daily.  Marland Kitchen lamoTRIgine (LAMICTAL) 200 MG tablet Take 1 tablet by mouth  daily  . Multiple Vitamins-Minerals (MULTIVITAMIN WITH MINERALS) tablet Take 1 tablet by mouth daily.  Marland Kitchen OLANZapine (ZYPREXA) 2.5 MG tablet Take 1 tablet (2.5 mg total) by mouth every morning.   No facility-administered encounter medications on file as of 02/02/2020.     History: Past Medical History:  Diagnosis Date  . Depression   . Glaucoma   . History of kidney stones    H/O  . Osteoporosis    Past Surgical History:  Procedure Laterality Date  . ABDOMINAL HYSTERECTOMY     has 1 ovary left  . BREAST BIOPSY  Right 02/24/2018   affirm bx, coil clip, radial scar  . BREAST EXCISIONAL BIOPSY Right 03/09/2018   Fibercystic change FOCAL USUAL DUCT HYPERPLASIA NEG MARGINS  . BREAST LUMPECTOMY Right 03/09/2018   radial scar  . BREAST LUMPECTOMY WITH NEEDLE LOCALIZATION Right 03/09/2018   Procedure: BREAST LUMPECTOMY WITH NEEDLE LOCALIZATION;  Surgeon: Fredirick Maudlin, MD;  Location: [facility] ORS;  Service: General;  Laterality: Right;  . CHOLECYSTECTOMY N/A 11/21/2017   Procedure: LAPAROSCOPIC CHOLECYSTECTOMY WITH INTRAOPERATIVE CHOLANGIOGRAM;  Surgeon: Florene Glen, MD;  Location: [facility] ORS;  Service: General;  Laterality: N/A;  . ENDOSCOPIC RETROGRADE CHOLANGIOPANCREATOGRAPHY (ERCP) WITH PROPOFOL N/A 11/20/2017   Procedure: ENDOSCOPIC RETROGRADE CHOLANGIOPANCREATOGRAPHY (ERCP) WITH PROPOFOL;  Surgeon: Lucilla Lame, MD;  Location: [facility] ENDOSCOPY;  Service: Endoscopy;  Laterality: N/A;  . HEMORRHOID SURGERY      Family History  Problem Relation Age of Onset  . Osteoporosis Mother   . Graves' disease Mother   . Osteoporosis Father   . Hernia Father   . Heart disease Father   . Kidney Stones Father   . Hypotension Father   . Healthy Brother   . Osteoporosis Paternal Grandmother    Social History   Occupational History  . Not on file  Tobacco Use  . Smoking status: Former Smoker    Packs/day: 1.00    Years: 35.00    Pack years: 35.00    Types: Cigarettes    Quit date: 03/03/2005    Years since quitting: 14.9  . Smokeless tobacco:  Never Used  Vaping Use  . Vaping Use: Never used  Substance and Sexual Activity  . Alcohol use: Yes    Alcohol/week: 1.0 standard drink    Types: 1 Cans of beer per week    Comment: 1 BEER EVERDAY  . Drug use: No  . Sexual activity: Yes    Birth control/protection: Surgical    Tobacco Counseling Counseling given: Not Answered   Immunizations and Health Maintenance Immunization History  Administered Date(s) Administered  . Fluad Quad(high Dose 65+)  01/10/2020  . Influenza Inj Mdck Quad Pf 02/07/2017  . Influenza,inj,Quad PF,6+ Mos 06/26/2009, 01/18/2015, 01/30/2016, 01/10/2019  . PFIZER SARS-COV-2 Vaccination 07/23/2019, 08/17/2019  . Pneumococcal Conjugate-13 01/10/2020  . Tdap 07/04/2014  . Zoster 03/29/2015   Health Maintenance Due  Topic Date Due  . DEXA SCAN  Never done    Activities of Daily Living In your present state of health, do you have any difficulty performing the following activities: 02/02/2020  Hearing? Y  Comment tinnutis - has seen ENT  Vision? Y  Comment retinal hole - sees ophthalmology  Difficulty concentrating or making decisions? N  Walking or climbing stairs? N  Dressing or bathing? N  Doing errands, shopping? N  Preparing Food and eating ? N  Using the Toilet? N  In the past six months, have you accidently leaked urine? N  Do you have problems with loss of bowel control? N  Managing your Medications? N  Managing your Finances? N  Housekeeping or managing your Housekeeping? N  Some recent data might be hidden    Physical Exam;  Physical Exam Constitutional:      General: She is not in acute distress.    Appearance: She is well-developed. She is not diaphoretic.  HENT:     Right Ear: External ear normal.     Left Ear: External ear normal.     Nose: Nose normal.  Eyes:     Conjunctiva/sclera: Conjunctivae normal.  Cardiovascular:     Rate and Rhythm: Normal rate and regular rhythm.     Heart sounds: No murmur heard.   Pulmonary:     Effort: Pulmonary effort is normal. No respiratory distress.     Breath sounds: Normal breath sounds. No wheezing.  Musculoskeletal:     Cervical back: Neck supple.  Skin:    General: Skin is warm and dry.     Capillary Refill: Capillary refill takes less than 2 seconds.  Neurological:     Mental Status: She is alert. Mental status is at baseline.  Psychiatric:        Mood and Affect: Mood normal.        Behavior: Behavior normal.      Advanced  Directives: Does Patient Have a Medical Advance Directive?: Yes Type of Advance Directive: Healthcare Power of Attorney, Living will Does patient want to make changes to medical advance directive?: No - Patient declined Copy of [place] in Chart?: No - copy requested    Assessment:    This is a routine wellness examination for this patient .   Vision/Hearing screen  Hearing Screening   125Hz  250Hz  500Hz  1000Hz  2000Hz  3000Hz  4000Hz  6000Hz  8000Hz   Right ear:  20 20 20  0  0    Left ear:  20 20 20  0  0      Visual Acuity Screening   Right eye Left eye Both eyes  Without correction:     With correction: 20/40 20/40 20/25     Dietary  issues and exercise activities discussed:  Current Exercise Habits: Home exercise routine, Type of exercise: strength training/weights;stretching;walking, Time (Minutes): 30, Frequency (Times/Week): 5, Weekly Exercise (Minutes/Week): 150, Intensity: Moderate, Exercise limited by: None identified  Goals    . Weight (lb) < 130 lb (59 kg)      Depression Screen PHQ 2/9 Scores 02/02/2020 02/02/2020 05/16/2019 01/10/2019  PHQ - 2 Score 0 0 0 0  PHQ- 9 Score - - 0 -     Fall Risk Fall Risk  02/02/2020  Falls in the past year? 0  Number falls in past yr: 0  Injury with Fall? 0  Risk for fall due to : No Fall Risks  Follow up Falls evaluation completed    Cognitive Function:       Mini-Cog - 02/02/20 1647    Normal clock drawing test? yes    How many words correct? 3              Patient Care Team: Lesleigh Noe, MD as PCP - General (Family Medicine) Hayden Pedro, MD as Consulting Physician (Ophthalmology)   EKG: low voltages, Sinus rhythm, no ST changes   Health Maintenance  Topic Date Due  . DEXA SCAN  Never done  . PNA vac Low Risk Adult (2 of 2 - PPSV23) 01/09/2021  . MAMMOGRAM  01/25/2022  . TETANUS/TDAP  07/03/2024  . COLONOSCOPY  07/17/2029  . INFLUENZA VACCINE  Completed  . COVID-19 Vaccine   Completed  . Hepatitis C Screening  Completed  . HIV Screening  Completed        Plan:     I have personally reviewed and noted the following in the patient's chart:   . Medical and social history . Use of alcohol, tobacco or illicit drugs  . Current medications and supplements . Functional ability and status . Nutritional status . Physical activity . Advanced directives . List of other physicians . Hospitalizations, surgeries, and ER visits in previous 12 months . Vitals . Screenings to include cognitive, depression, and falls . Referrals and appointments  In addition, I have reviewed and discussed with patient certain preventive protocols, quality metrics, and best practice recommendations. A written personalized care plan for preventive services as well as general preventive health recommendations were provided to patient.     Lesleigh Noe, MD 02/02/2020

## 2020-02-04 ENCOUNTER — Other Ambulatory Visit: Payer: Self-pay

## 2020-02-04 ENCOUNTER — Ambulatory Visit: Payer: Medicare HMO | Attending: Internal Medicine

## 2020-02-04 DIAGNOSIS — Z23 Encounter for immunization: Secondary | ICD-10-CM

## 2020-02-04 NOTE — Progress Notes (Signed)
   Covid-19 Vaccination Clinic  Name:  Rhonda Reeves    MRN: [id number] DOB: March 04, 1956  02/04/2020  Rhonda Reeves was observed post Covid-19 immunization for 15 minutes without incident. She was provided with Vaccine Information Sheet and instruction to access the V-Safe system.   Rhonda Reeves was instructed to call 911 with any severe reactions post vaccine: Marland Kitchen Difficulty breathing  . Swelling of face and throat  . A fast heartbeat  . A bad rash all over body  . Dizziness and weakness

## 2020-02-27 ENCOUNTER — Other Ambulatory Visit: Payer: Self-pay

## 2020-02-27 ENCOUNTER — Ambulatory Visit
Admission: RE | Admit: 2020-02-27 | Discharge: 2020-02-27 | Disposition: A | Discharge status: Home / Self Care | Payer: Medicare HMO | Source: Ambulatory Visit | Attending: Family Medicine | Admitting: Family Medicine

## 2020-02-27 DIAGNOSIS — Z0000 Encounter for general adult medical examination without abnormal findings: Secondary | ICD-10-CM | POA: Diagnosis not present

## 2020-02-27 DIAGNOSIS — Z90710 Acquired absence of both cervix and uterus: Secondary | ICD-10-CM | POA: Diagnosis not present

## 2020-02-27 DIAGNOSIS — E2839 Other primary ovarian failure: Secondary | ICD-10-CM | POA: Diagnosis not present

## 2020-02-27 DIAGNOSIS — M8589 Other specified disorders of bone density and structure, multiple sites: Secondary | ICD-10-CM | POA: Diagnosis not present

## 2020-02-27 DIAGNOSIS — Z780 Asymptomatic menopausal state: Secondary | ICD-10-CM | POA: Diagnosis not present

## 2020-02-27 DIAGNOSIS — Z90722 Acquired absence of ovaries, bilateral: Secondary | ICD-10-CM | POA: Diagnosis not present

## 2020-02-28 ENCOUNTER — Encounter: Payer: Self-pay | Admitting: Family Medicine

## 2020-02-28 DIAGNOSIS — M8580 Other specified disorders of bone density and structure, unspecified site: Secondary | ICD-10-CM | POA: Insufficient documentation

## 2020-02-29 IMAGING — MG STEREOTACTIC VACUUM ASSIST RIGHT
8 of 16 series · 8 of 28 positions shown · non-contrast
Comparison: Previous exams.

Addendum:
CLINICAL DATA: Patient with architectural distortion within the
outer RIGHT breast presents today for stereotactic biopsy using 3D
tomosynthesis guidance.

EXAM:
RIGHT BREAST STEREOTACTIC CORE NEEDLE BIOPSY

[R (1 of 8)]
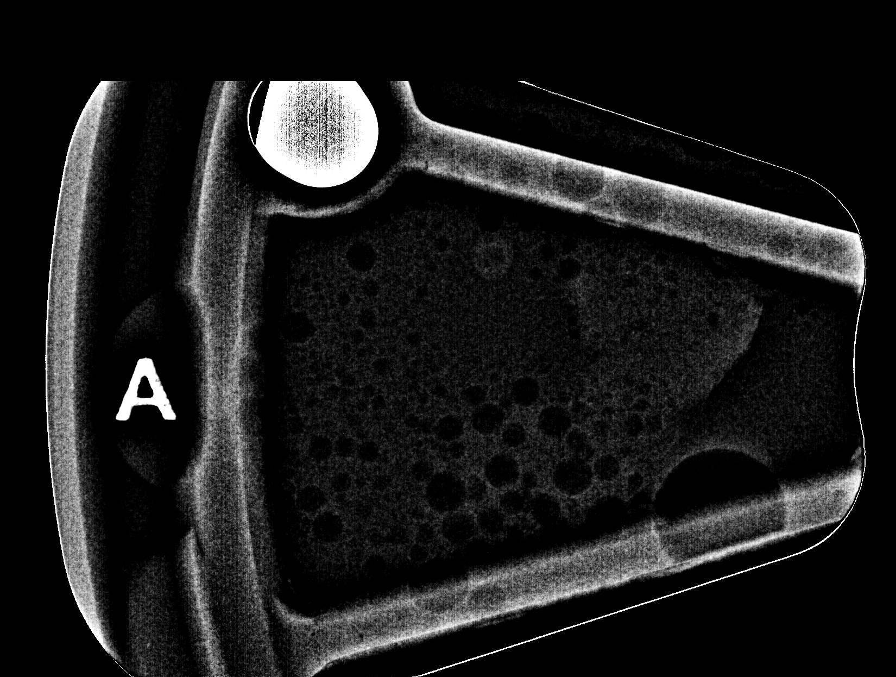

[R (2 of 8)]
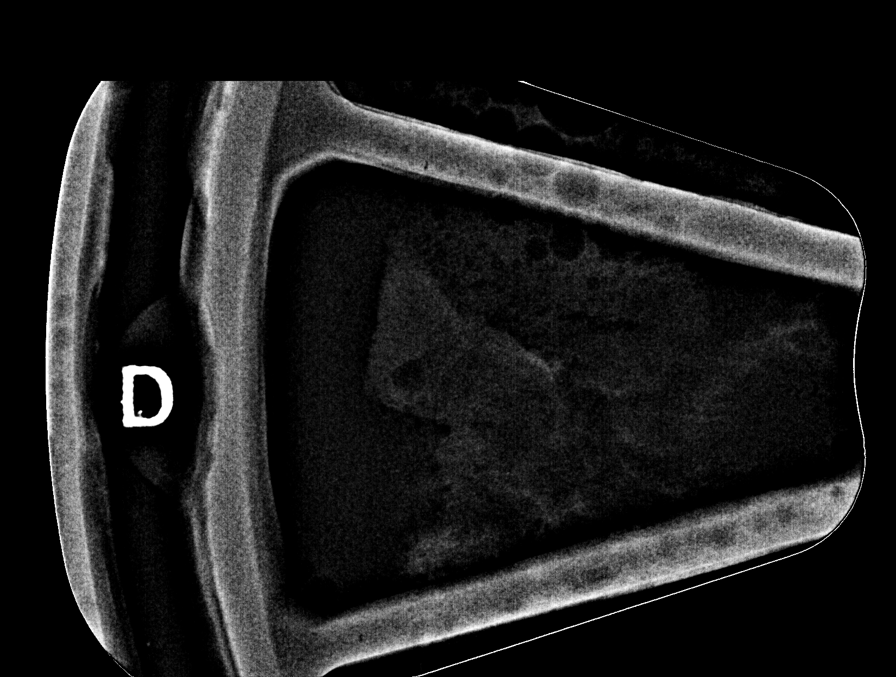

[R (3 of 8)]
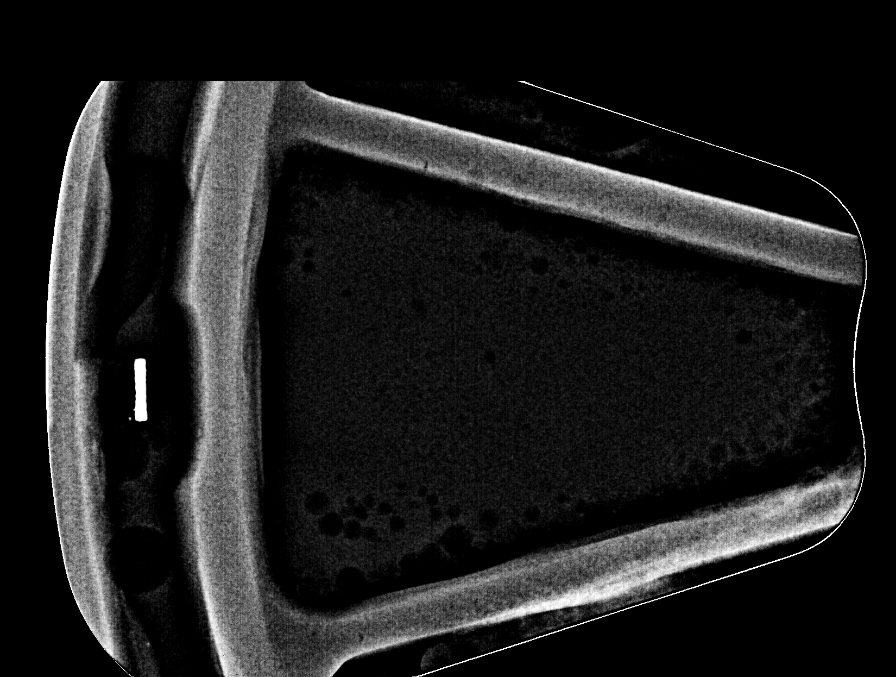

[R (4 of 8)]
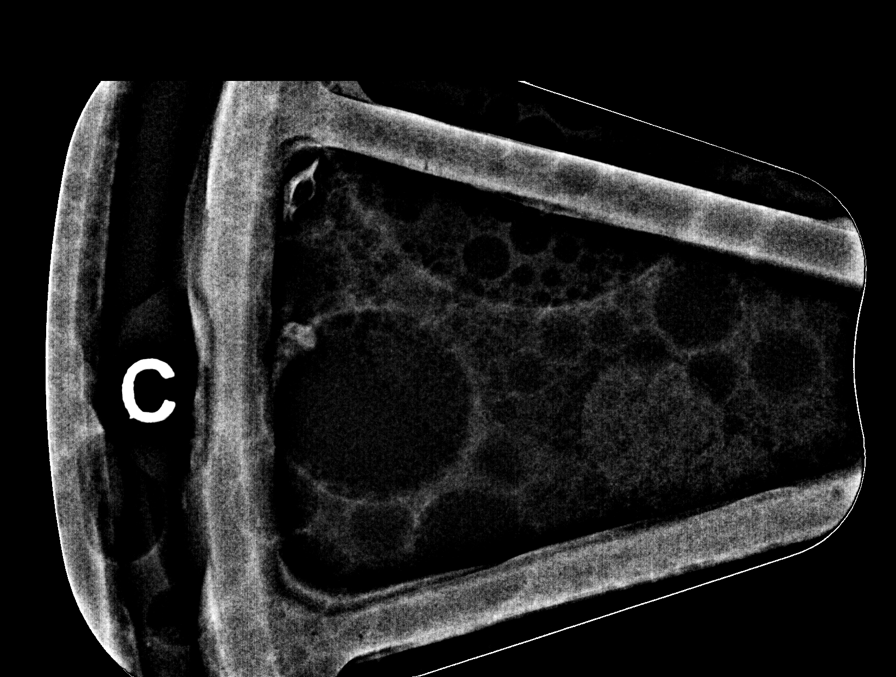

[R (5 of 8)]
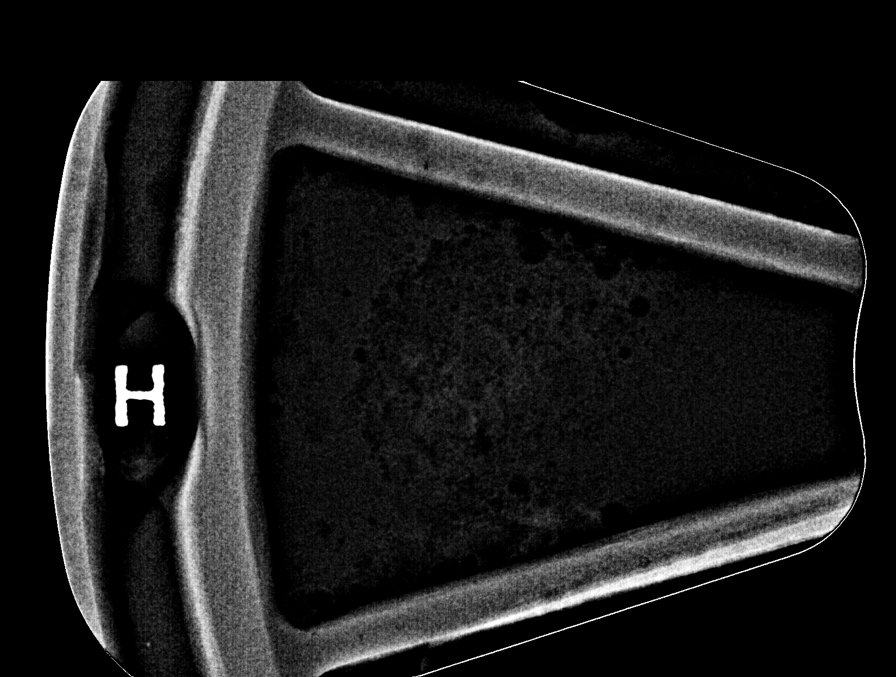

[R (6 of 8)]
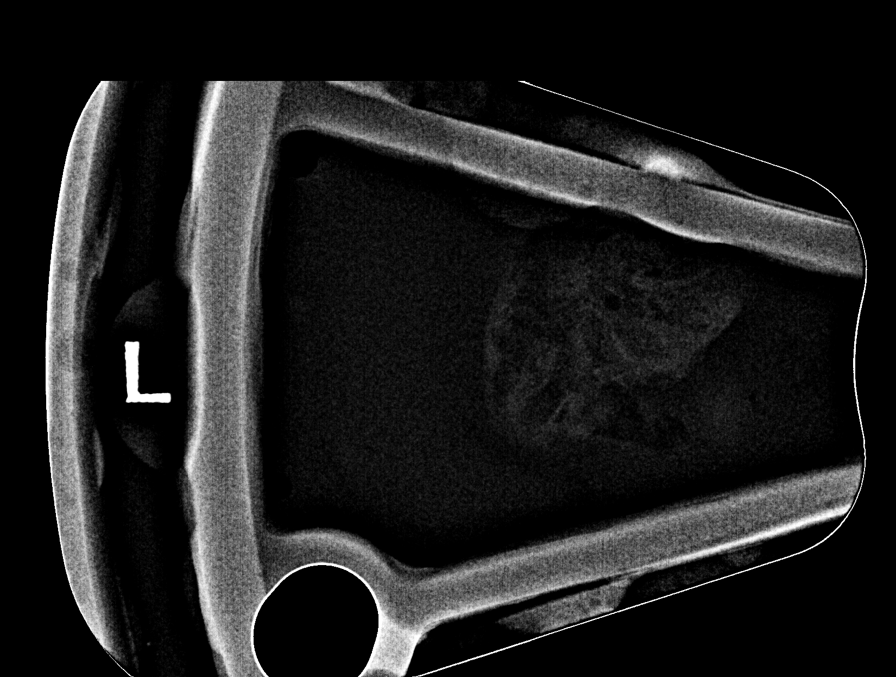

[R (7 of 8)]
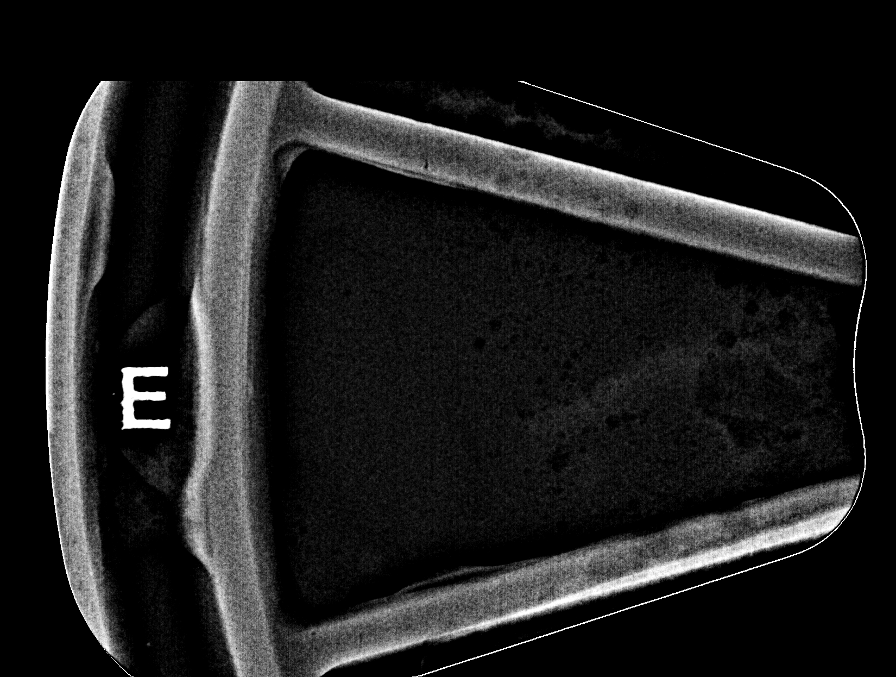

[R (8 of 8)]
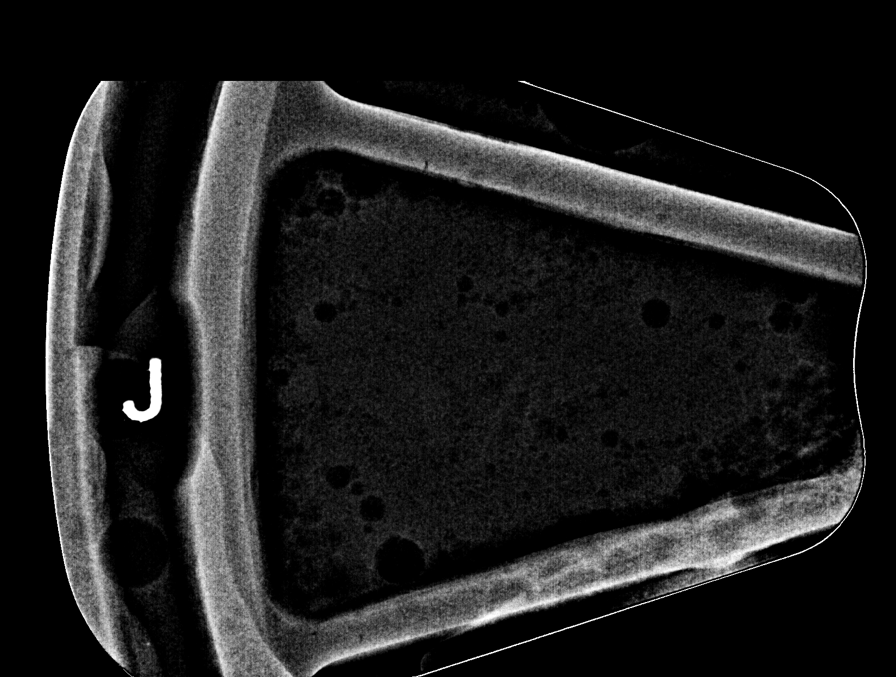

[8 of 28 positions shown; findings below may reference images not displayed]



Using sterile technique and 1% Lidocaine as local anesthetic, under
stereotactic guidance, a 9 gauge vacuum assisted device was used to
perform core needle biopsy of architectural distortion in the lower
outer quadrant of the RIGHT breast using a superior approach.

Lesion quadrant: Lower outer quadrant

At the conclusion of the procedure, a coil shaped tissue marker clip
was deployed into the biopsy cavity. Follow-up 2-view mammogram was
performed and dictated separately.
IMPRESSION: Stereotactic-guided biopsy of architectural distortion within the
lower outer quadrant the RIGHT breast.. No apparent complications.

ADDENDUM:
Pathology of the right breast biopsy revealed A. BREAST
ARCHITECTURAL DISTORTION, RIGHT LOWER OUTER QUADRANT; STEREOTACTIC
BIOPSY: COMPLEX SCLEROSING LESION WITH USUAL DUCTAL HYPERPLASIA,
SCLEROSING ADENOSIS, AND APOCRINE METAPLASIA. NEGATIVE FOR ATYPIA
AND MALIGNANCY.

This was found to be concordant by Dr. Tameaka.

Recommendation: Surgical referral for excision.

Results and recommendations were relayed to Dr. Dinsmore nurse by
phone by Mengko, Yunada on 02/25/18. After discussion with Dr.
Greisy, they asked that we contact the patient with results and
arrange the surgical referral.

At the patient's request, results and recommendations were relayed
to the patient by phone by Mengko, Yunada on 02/26/18. The patient
stated she did well following the biopsy with some soreness but no
bleeding or bruising. Post biopsy instructions were reviewed with
the patient and all of her questions were answered. A surgical
referral appointment was made with Dr. Klpigbb at [REDACTED] for 03/03/18 at [DATE]. The patient has been notified of
the appointment.

Addendum by Mengko, Yunada on 02/26/18.

*** End of Addendum ***

## 2020-03-20 ENCOUNTER — Ambulatory Visit: Payer: 59 | Admitting: Dermatology

## 2020-04-26 ENCOUNTER — Ambulatory Visit (INDEPENDENT_AMBULATORY_CARE_PROVIDER_SITE_OTHER): Payer: Medicare HMO | Admitting: Family Medicine

## 2020-04-26 ENCOUNTER — Encounter: Payer: Self-pay | Admitting: Family Medicine

## 2020-04-26 ENCOUNTER — Other Ambulatory Visit: Payer: Self-pay

## 2020-04-26 VITALS — BP 142/80 | HR 86 | Temp 98.2°F | Ht 62.0 in | Wt 144.0 lb

## 2020-04-26 DIAGNOSIS — L600 Ingrowing nail: Secondary | ICD-10-CM

## 2020-04-26 MED ORDER — MUPIROCIN 2 % EX OINT: 1.0000 "application " | TOPICAL_OINTMENT | Freq: Three times a day (TID) | CUTANEOUS | 0 refills | Status: DC

## 2020-04-26 MED ORDER — CEPHALEXIN 500 MG PO CAPS: 500.0000 mg | ORAL_CAPSULE | Freq: Three times a day (TID) | ORAL | 0 refills | Status: DC

## 2020-04-26 NOTE — Patient Instructions (Addendum)
Need to clear up infection before we possibly take out.  1) sent in keflex for you to take three times a day for 7 days 2) can soak in warm water 3) also sent in topical ointment that is good for staph and strep.  4) referral to podiatry done as well if needs to be removed.   Ingrown Toenail An ingrown toenail occurs when the corner or sides of a toenail grow into the surrounding skin. This causes discomfort and pain. The big toe is most commonly affected, but any of the toes can be affected. If an ingrown toenail is not treated, it can become infected. What are the causes? This condition may be caused by:  Wearing shoes that are too small or tight.  An injury, such as stubbing your toe or having your toe stepped on.  Improper cutting or care of your toenails.  Having nail or foot abnormalities that were present from birth (congenital abnormalities), such as having a nail that is too big for your toe. What increases the risk? The following factors may make you more likely to develop ingrown toenails:  Age. Nails tend to get thicker with age, so ingrown nails are more common among older people.  Cutting your toenails incorrectly, such as cutting them very short or cutting them unevenly. An ingrown toenail is more likely to get infected if you have:  Diabetes.  Blood flow (circulation) problems. What are the signs or symptoms? Symptoms of an ingrown toenail may include:  Pain, soreness, or tenderness.  Redness.  Swelling.  Hardening of the skin that surrounds the toenail. Signs that an ingrown toenail may be infected include:  Fluid or pus.  Symptoms that get worse instead of better. How is this diagnosed? An ingrown toenail may be diagnosed based on your medical history, your symptoms, and a physical exam. If you have fluid or blood coming from your toenail, a sample may be collected to test for the specific type of bacteria that is causing the infection. How is this  treated? Treatment depends on how severe your ingrown toenail is. You may be able to care for your toenail at home.  If you have an infection, you may be prescribed antibiotic medicines.  If you have fluid or pus draining from your toenail, your health care provider may drain it.  If you have trouble walking, you may be given crutches to use.  If you have a severe or infected ingrown toenail, you may need a procedure to remove part or all of the nail. Follow these instructions at home: Foot care   Do not pick at your toenail or try to remove it yourself.  Soak your foot in warm, soapy water. Do this for 20 minutes, 3 times a day, or as often as told by your health care provider. This helps to keep your toe clean and keep your skin soft.  Wear shoes that fit well and are not too tight. Your health care provider may recommend that you wear open-toed shoes while you heal.  Trim your toenails regularly and carefully. Cut your toenails straight across to prevent injury to the skin at the corners of the toenail. Do not cut your nails in a curved shape.  Keep your feet clean and dry to help prevent infection. Medicines  Take over-the-counter and prescription medicines only as told by your health care provider.  If you were prescribed an antibiotic, take it as told by your health care provider. Do not stop taking  the antibiotic even if you start to feel better. Activity  Return to your normal activities as told by your health care provider. Ask your health care provider what activities are safe for you.  Avoid activities that cause pain. General instructions  If your health care provider told you to use crutches to help you move around, use them as instructed.  Keep all follow-up visits as told by your health care provider. This is important. Contact a health care provider if:  You have more redness, swelling, pain, or other symptoms that do not improve with treatment.  You have  fluid, blood, or pus coming from your toenail. Get help right away if:  You have a red streak on your skin that starts at your foot and spreads up your leg.  You have a fever. Summary  An ingrown toenail occurs when the corner or sides of a toenail grow into the surrounding skin. This causes discomfort and pain. The big toe is most commonly affected, but any of the toes can be affected.  If an ingrown toenail is not treated, it can become infected.  Fluid or pus draining from your toenail is a sign of infection. Your health care provider may need to drain it. You may be given antibiotics to treat the infection.  Trimming your toenails regularly and properly can help you prevent an ingrown toenail. This information is not intended to replace advice given to you by your health care provider. Make sure you discuss any questions you have with your health care provider. Document Revised: 07/30/2018 Document Reviewed: 12/24/2016 Elsevier Patient Education  2020 [employer].

## 2020-04-26 NOTE — Progress Notes (Signed)
Patient: Rhonda Reeves MRN: [id number] DOB: Dec 24, 1955 PCP: Lynnda Child, MD     Subjective:  Chief Complaint  Patient presents with  . Ingrown Toenail    HPI: The patient is a 65 y.o. female who presents today for right swollen big toe. She thinks that it is an ingrown toe nail. She says it began Sunday. She cut her toenails Saturday afternoon. She states last night the pain in her right toe woke her up out of her sleep. She states it was puffy and red and hurting. She denies any drainage. No fever/chills.   Review of Systems  Constitutional: Negative for chills and fever.  Musculoskeletal: Negative for gait problem and joint swelling.  Skin: Positive for color change. Negative for wound.    Allergies Patient is allergic to sulfa antibiotics.  Past Medical History Patient  has a past medical history of Depression, Glaucoma, History of kidney stones, and Osteoporosis.  Surgical History Patient  has a past surgical history that includes Hemorrhoid surgery; Cholecystectomy (N/A, 11/21/2017); Endoscopic retrograde cholangiopancreatography (ercp) with propofol (N/A, 11/20/2017); Breast lumpectomy (Right, 03/09/2018); Breast lumpectomy with needle localization (Right, 03/09/2018); Abdominal hysterectomy; Breast biopsy (Right, 02/24/2018); and Breast excisional biopsy (Right, 03/09/2018).  Family History Pateint's family history includes Graves' disease in her mother; Healthy in her brother; Heart disease in her father; Hernia in her father; Hypotension in her father; Kidney Stones in her father; Osteoporosis in her father, mother, and paternal grandmother.  Social History Patient  reports that she quit smoking about 15 years ago. Her smoking use included cigarettes. She has a 35.00 pack-year smoking history. She has never used smokeless tobacco. She reports current alcohol use of about 1.0 standard drink of alcohol per week. She reports that she does not use drugs.     Objective: Vitals:   04/26/20 1439  BP: (!) 142/80  Pulse: 86  Temp: 98.2 F (36.8 C)  TempSrc: Temporal  SpO2: 98%  Weight: 144 lb (65.3 kg)  Height: 5\' 2"  (1.575 m)    Body mass index is 26.34 kg/m.  Physical Exam Vitals reviewed.  Constitutional:      Appearance: Normal appearance. She is normal weight.  HENT:     Head: Normocephalic and atraumatic.  Pulmonary:     Effort: Pulmonary effort is normal.  Skin:    Findings: Erythema present.     Comments: Right big toe: erythema at base of nail bed into cuticle and on medial aspect of toe/ TTP. Mild fluctuance. Pedal pulses intact   Neurological:     Mental Status: She is alert.   verbal consent obtained. Cleaned area with alcohol. 25G needle very superficially inserted into medial nail bed.  No drainage of blood or purulent material.     Assessment/plan: 1. Ingrown toenail of right foot with infection vs. paronychia  -course of keflex, foot soaks and topical bactroban. Referral to podiatry placed to be seen after infection clears up for possible nail removal. Precautions given for worsening infection.  - Ambulatory referral to Podiatry   This visit occurred during the SARS-CoV-2 public health emergency.  Safety protocols were in place, including screening questions prior to the visit, additional usage of staff PPE, and extensive cleaning of exam room while observing appropriate contact time as indicated for disinfecting solutions.       Return if symptoms worsen or fail to improve.    , MD [facility]   04/26/2020

## 2020-05-13 ENCOUNTER — Encounter: Payer: Self-pay | Admitting: Family Medicine

## 2020-05-17 ENCOUNTER — Ambulatory Visit: Payer: Medicare HMO | Admitting: Podiatry

## 2020-05-21 ENCOUNTER — Ambulatory Visit: Payer: 59 | Admitting: Dermatology

## 2020-07-03 ENCOUNTER — Other Ambulatory Visit: Payer: Self-pay | Admitting: Family Medicine

## 2020-07-03 DIAGNOSIS — F3175 Bipolar disorder, in partial remission, most recent episode depressed: Secondary | ICD-10-CM

## 2020-07-03 DIAGNOSIS — F3341 Major depressive disorder, recurrent, in partial remission: Secondary | ICD-10-CM

## 2020-08-06 ENCOUNTER — Encounter: Payer: Medicare HMO | Admitting: Family Medicine

## 2020-08-06 ENCOUNTER — Other Ambulatory Visit: Payer: Self-pay

## 2020-08-06 NOTE — Progress Notes (Signed)
Disregard

## 2020-08-20 ENCOUNTER — Ambulatory Visit (INDEPENDENT_AMBULATORY_CARE_PROVIDER_SITE_OTHER): Payer: Medicare HMO | Admitting: Family Medicine

## 2020-08-20 ENCOUNTER — Other Ambulatory Visit: Payer: Self-pay

## 2020-08-20 VITALS — BP 140/84 | HR 71 | Temp 98.2°F | Ht 62.0 in | Wt 143.5 lb

## 2020-08-20 DIAGNOSIS — F3175 Bipolar disorder, in partial remission, most recent episode depressed: Secondary | ICD-10-CM

## 2020-08-20 DIAGNOSIS — E782 Mixed hyperlipidemia: Secondary | ICD-10-CM

## 2020-08-20 DIAGNOSIS — R030 Elevated blood-pressure reading, without diagnosis of hypertension: Secondary | ICD-10-CM | POA: Insufficient documentation

## 2020-08-20 DIAGNOSIS — F3341 Major depressive disorder, recurrent, in partial remission: Secondary | ICD-10-CM | POA: Diagnosis not present

## 2020-08-20 DIAGNOSIS — R69 Illness, unspecified: Secondary | ICD-10-CM | POA: Diagnosis not present

## 2020-08-20 MED ORDER — OLANZAPINE 5 MG PO TABS: 2.5000 mg | ORAL_TABLET | Freq: Every morning | ORAL | 3 refills | Status: DC

## 2020-08-20 NOTE — Patient Instructions (Signed)
Higher of Olanzapine 5 mg sent to pharmacy  Call or mychart if not working and will plan for psych consult  Return in 5 months or sooner

## 2020-08-20 NOTE — Progress Notes (Signed)
Subjective:     Rhonda Reeves is a 65 y.o. female presenting for Medication Management (Would like to increase olanzapine )     HPI  #Depression/bipolar - has been on Lamictal 200 mg and fluoxetine 10 mg and olanzapine 2.5 mg - when she starts to slip into symptoms she responds well to a small increase in olanzapine - has been taking 5 mg of olanzapine with some improvement in her symptoms - mother is 53 and may have had a stroke and is not doing well - cat died last week - 26 yo - no manic symptoms - just more depression - would typically take the higher dose for 6 months before decreasing again   Review of Systems   Social History   Tobacco Use  Smoking Status Former Smoker  . Packs/day: 1.00  . Years: 35.00  . Pack years: 35.00  . Types: Cigarettes  . Quit date: 03/03/2005  . Years since quitting: 15.4  Smokeless Tobacco Never Used        Objective:    BP Readings from Last 3 Encounters:  08/20/20 140/84  04/26/20 (!) 142/80  02/02/20 122/78   Wt Readings from Last 3 Encounters:  08/20/20 143 lb 8 oz (65.1 kg)  08/06/20 138 lb (62.6 kg)  04/26/20 144 lb (65.3 kg)    BP 140/84   Pulse 71   Temp 98.2 F (36.8 C) (Temporal)   Ht 5\' 2"  (1.575 m)   Wt 143 lb 8 oz (65.1 kg)   SpO2 97%   BMI 26.25 kg/m    Physical Exam Constitutional:      General: She is not in acute distress.    Appearance: She is well-developed. She is not diaphoretic.  HENT:     Right Ear: External ear normal.     Left Ear: External ear normal.     Nose: Nose normal.  Eyes:     Conjunctiva/sclera: Conjunctivae normal.  Cardiovascular:     Rate and Rhythm: Normal rate and regular rhythm.     Heart sounds: No murmur heard.   Pulmonary:     Effort: Pulmonary effort is normal. No respiratory distress.     Breath sounds: Normal breath sounds. No wheezing.  Musculoskeletal:     Cervical back: Neck supple.  Skin:    General: Skin is warm and dry.     Capillary Refill:  Capillary refill takes less than 2 seconds.  Neurological:     Mental Status: She is alert. Mental status is at baseline.  Psychiatric:        Mood and Affect: Mood normal.        Behavior: Behavior normal.      The 10-year ASCVD risk score Mikey Bussing DC Jr., et al., 2013) is: 6.1%   Values used to calculate the score:     Age: 60 years     Sex: Female     Is Non-Hispanic African American: No     Diabetic: No     Tobacco smoker: No     Systolic Blood Pressure: 093 mmHg     Is BP treated: No     HDL Cholesterol: 73.6 mg/dL     Total Cholesterol: 220 mg/dL      Assessment & Plan:   Problem List Items Addressed This Visit      Other   Bipolar disorder ([place])    Stable. Continue lamotrigine 200 mg.       Relevant Medications   OLANZapine (ZYPREXA) 5  MG tablet   Recurrent major depressive disorder, in partial remission ([place]) - Primary    Slightly worse. Cont fluoxetine 10 mg. Increase olanzapine 2.5 mg > 5 mg as this has helped in the past. Discussed if persisting given comorbid bipolar would recommend psych referral.       Relevant Medications   OLANZapine (ZYPREXA) 5 MG tablet   Hyperlipidemia    ASCVD 6.1%. Will continue to monitor with increased zyprexa dose.       Elevated blood pressure reading    May be stress related. Will reassess at annual exam.           Return in about 5 months (around 01/20/2021) for annual.  Lesleigh Noe, MD  This visit occurred during the SARS-CoV-2 public health emergency.  Safety protocols were in place, including screening questions prior to the visit, additional usage of staff PPE, and extensive cleaning of exam room while observing appropriate contact time as indicated for disinfecting solutions.

## 2020-08-20 NOTE — Assessment & Plan Note (Signed)
Stable. Continue lamotrigine 200 mg.

## 2020-08-20 NOTE — Assessment & Plan Note (Signed)
Slightly worse. Cont fluoxetine 10 mg. Increase olanzapine 2.5 mg > 5 mg as this has helped in the past. Discussed if persisting given comorbid bipolar would recommend psych referral.

## 2020-08-20 NOTE — Assessment & Plan Note (Signed)
May be stress related. Will reassess at annual exam.

## 2020-08-20 NOTE — Assessment & Plan Note (Signed)
ASCVD 6.1%. Will continue to monitor with increased zyprexa dose.

## 2020-09-14 ENCOUNTER — Ambulatory Visit (INDEPENDENT_AMBULATORY_CARE_PROVIDER_SITE_OTHER): Payer: Medicare HMO | Admitting: Family Medicine

## 2020-09-14 ENCOUNTER — Encounter: Payer: Self-pay | Admitting: Family Medicine

## 2020-09-14 ENCOUNTER — Other Ambulatory Visit: Payer: Self-pay

## 2020-09-14 ENCOUNTER — Telehealth: Payer: Self-pay

## 2020-09-14 VITALS — BP 140/84 | HR 70 | Temp 97.9°F | Ht 62.0 in | Wt 143.5 lb

## 2020-09-14 DIAGNOSIS — S90561A Insect bite (nonvenomous), right ankle, initial encounter: Secondary | ICD-10-CM | POA: Diagnosis not present

## 2020-09-14 DIAGNOSIS — W57XXXA Bitten or stung by nonvenomous insect and other nonvenomous arthropods, initial encounter: Secondary | ICD-10-CM

## 2020-09-14 MED ORDER — CEPHALEXIN 500 MG PO CAPS: 500.0000 mg | ORAL_CAPSULE | Freq: Three times a day (TID) | ORAL | 0 refills | Status: DC

## 2020-09-14 MED ORDER — TRIAMCINOLONE ACETONIDE 0.5 % EX CREA: 1.0000 "application " | TOPICAL_CREAM | Freq: Two times a day (BID) | CUTANEOUS | 0 refills | Status: DC

## 2020-09-14 NOTE — Telephone Encounter (Signed)
[facility] - Client TELEPHONE ADVICE RECORD [facility] Patient Name: [name] Gender: Female DOB: Jun 13, 1955 Age: 65 Y 2 D Return Phone Number: [phone] (Primary) Address: City/ State/ Zip: [place] [address] Client [facility] - Client Client [facility] - Day Physician Waunita Schooner- MD Contact Type Call Who Is Calling Patient / Member / Family / Caregiver Call Type Triage / Clinical Relationship To Patient Self Return Phone Number [phone] (Primary) Chief Complaint Insect Bite Reason for Call Symptomatic / Request for [name] says she was bit by a spider and wants to know what to do. Above her ankle is swollen and purple in color and dark red. Translation No Nurse Assessment Nurse: [name], RN, [name] Date/Time (Eastern Time): 09/14/2020 9:02:14 AM Confirm and document reason for call. If symptomatic, describe symptoms. ---Caller states she has an insect bite above her right ankle. The area has a small white center that is surrounded by a red/purple splotchy area. She first noticed it on Sunday. Does the patient have any new or worsening symptoms? ---Yes Will a triage be completed? ---Yes Related visit to physician within the last 2 weeks? ---No Does the PT have any chronic conditions? (i.e. diabetes, asthma, this includes High risk factors for pregnancy, etc.) ---Yes List chronic conditions. ---Refused to list Is this a behavioral health or substance abuse call? ---No Guidelines Guideline Title Affirmed Question Affirmed Notes Nurse Date/Time (Eastern Time) Insect Bite Bite starts to look bad (e.g., blister, purplish skin, ulcer) (Exception: There is just minor swelling or small red bump.) [name], RN, [name] 09/14/2020 9:04:32 AM Disp. Time Eilene Ghazi Time) Disposition Final User PLEASE NOTE: All timestamps contained within  this report are represented as [place] Standard Time. CONFIDENTIALTY NOTICE: This fax transmission is intended only for the addressee. It contains information that is legally privileged, confidential or otherwise protected from use or disclosure. If you are not the intended recipient, you are strictly prohibited from reviewing, disclosing, copying using or disseminating any of this information or taking any action in reliance on or regarding this information. If you have received this fax in error, please notify us immediately by telephone so that we can arrange for its return to [place]. Phone: [phone], Toll-Free: [phone], Fax: [phone] Page: 2 of 2 Call Id: [id number] 09/14/2020 9:08:42 AM SEE PCP WITHIN 3 DAYS Yes [name], RN, [name] Caller Disagree/Comply Comply Caller Understands Yes PreDisposition Call Doctor Care Advice Given Per Guideline SEE PCP WITHIN 3 DAYS: * You need to be seen within 2 or 3 days. * PCP VISIT: Call your doctor (or NP/PA) during regular office hours and make an appointment. A clinic or urgent care center are good places to go for care if your doctor's office is closed or you can't get an appointment. NOTE: If office will be open tomorrow, tell caller to call then, not in 3 days. ANTIBIOTIC OINTMENT: * Put a small amount of antibiotic ointment on the area 3 times per day. * Cover the area with a clean gauze or an adhesive bandage (such as a Band-Aid). CALL BACK IF: * Fever occurs * You become worse CARE ADVICE given per Insect Bite (Adult) guideline. Referrals REFERRED TO PCP OFFICE

## 2020-09-14 NOTE — Telephone Encounter (Signed)
Per appt notes pt has appt with Dr Diona Browner 09/14/20 at 2:20 PM.

## 2020-09-14 NOTE — Progress Notes (Signed)
Patient ID: DORIA FERN, female    DOB: Mar 28, 1956, 65 y.o.   MRN: [id number]  This visit was conducted in person.  BP 140/84   Pulse 70   Temp 97.9 F (36.6 C) (Temporal)   Ht 5\' 2"  (1.575 m)   Wt 143 lb 8 oz (65.1 kg)   SpO2 95%   BMI 26.25 kg/m    CC:  Chief Complaint  Patient presents with   Insect Bite    Right Leg near ankle    Subjective:   HPI: Rhonda Reeves is a 65 y.o. female presenting on 09/14/2020 for Insect Bite (Right Leg near ankle)  She reports over last weekend when in garden started having itching on right inner ankle. Was rubbing area a lot. 1 day later noted redness, no pain,  occ itchy.  no discharge, no blister.  Redness has spread some, mild swelling in right ankle.  No flu like symptoms, no fever.   She has treated it with mupirocin ointment TID  In last 24 hours.     Tdap 07/04/2014  Relevant past medical, surgical, family and social history reviewed and updated as indicated. Interim medical history since our last visit reviewed. Allergies and medications reviewed and updated. Outpatient Medications Prior to Visit  Medication Sig Dispense Refill   Calcium Carb-Cholecalciferol (CALCIUM 600 + D PO) Take 1 tablet by mouth daily.     FLUoxetine (PROZAC) 10 MG capsule TAKE ONE CAPSULE BY MOUTH ONE TIME DAILY 90 capsule 2   lamoTRIgine (LAMICTAL) 200 MG tablet TAKE ONE TABLET BY MOUTH ONE TIME DAILY 90 tablet 2   Multiple Vitamins-Minerals (MULTIVITAMIN WITH MINERALS) tablet Take 1 tablet by mouth daily.     OLANZapine (ZYPREXA) 5 MG tablet Take 0.5 tablets (2.5 mg total) by mouth every morning. 90 tablet 3   mupirocin ointment (BACTROBAN) 2 % Apply 1 application topically 3 (three) times daily. (Patient taking differently: Apply 1 application topically as needed.) 30 g 0   mupirocin ointment (BACTROBAN) 2 % 1 application 3 (three) times daily as needed.     No facility-administered medications prior to visit.     Per HPI unless  specifically indicated in ROS section below Review of Systems Objective:  BP 140/84   Pulse 70   Temp 97.9 F (36.6 C) (Temporal)   Ht 5\' 2"  (1.575 m)   Wt 143 lb 8 oz (65.1 kg)   SpO2 95%   BMI 26.25 kg/m   Wt Readings from Last 3 Encounters:  09/14/20 143 lb 8 oz (65.1 kg)  08/20/20 143 lb 8 oz (65.1 kg)  08/06/20 138 lb (62.6 kg)      Physical Exam Constitutional:      General: She is not in acute distress.    Appearance: Normal appearance. She is well-developed. She is not ill-appearing or toxic-appearing.  HENT:     Head: Normocephalic.     Right Ear: Hearing, tympanic membrane, ear canal and external ear normal. Tympanic membrane is not erythematous, retracted or bulging.     Left Ear: Hearing, tympanic membrane, ear canal and external ear normal. Tympanic membrane is not erythematous, retracted or bulging.     Nose: No mucosal edema or rhinorrhea.     Right Sinus: No maxillary sinus tenderness or frontal sinus tenderness.     Left Sinus: No maxillary sinus tenderness or frontal sinus tenderness.     Mouth/Throat:     Pharynx: Uvula midline.  Eyes:     General:  Lids are normal. Lids are everted, no foreign bodies appreciated.     Conjunctiva/sclera: Conjunctivae normal.     Pupils: Pupils are equal, round, and reactive to light.  Neck:     Thyroid: No thyroid mass or thyromegaly.     Vascular: No carotid bruit.     Trachea: Trachea normal.  Cardiovascular:     Rate and Rhythm: Normal rate and regular rhythm.     Pulses: Normal pulses.     Heart sounds: Normal heart sounds, S1 normal and S2 normal. No murmur heard.   No friction rub. No gallop.  Pulmonary:     Effort: Pulmonary effort is normal. No tachypnea or respiratory distress.     Breath sounds: Normal breath sounds. No decreased breath sounds, wheezing, rhonchi or rales.  Abdominal:     General: Bowel sounds are normal.     Palpations: Abdomen is soft.     Tenderness: There is no abdominal tenderness.   Musculoskeletal:     Cervical back: Normal range of motion and neck supple.  Skin:    General: Skin is warm and dry.     Findings: No rash.  Neurological:     Mental Status: She is alert.  Psychiatric:        Mood and Affect: Mood is not anxious or depressed.        Speech: Speech normal.        Behavior: Behavior normal. Behavior is cooperative.        Thought Content: Thought content normal.        Judgment: Judgment normal.          Results for orders placed or performed in visit on 01/10/20  Comprehensive metabolic panel  Result Value Ref Range   Sodium 141 135 - 145 mEq/L   Potassium 3.9 3.5 - 5.1 mEq/L   Chloride 105 96 - 112 mEq/L   CO2 29 19 - 32 mEq/L   Glucose, Bld 90 70 - 99 mg/dL   BUN 13 6 - 23 mg/dL   Creatinine, Ser 0.87 0.40 - 1.20 mg/dL   Total Bilirubin 0.6 0.2 - 1.2 mg/dL   Alkaline Phosphatase 72 39 - 117 U/L   AST 21 0 - 37 U/L   ALT 17 0 - 35 U/L   Total Protein 7.1 6.0 - 8.3 g/dL   Albumin 4.5 3.5 - 5.2 g/dL   GFR 65.28 >60.00 mL/min   Calcium 9.3 8.4 - 10.5 mg/dL  Lipid panel  Result Value Ref Range   Cholesterol 220 (H) 0 - 200 mg/dL   Triglycerides 99.0 0.0 - 149.0 mg/dL   HDL 73.60 >39.00 mg/dL   VLDL 19.8 0.0 - 40.0 mg/dL   LDL Cholesterol 126 (H) 0 - 99 mg/dL   Total CHOL/HDL Ratio 3    NonHDL 146.24     This visit occurred during the SARS-CoV-2 public health emergency.  Safety protocols were in place, including screening questions prior to the visit, additional usage of staff PPE, and extensive cleaning of exam room while observing appropriate contact time as indicated for disinfecting solutions.   COVID 19 screen:  No recent travel or known exposure to COVID19 The patient denies respiratory symptoms of COVID 19 at this time. The importance of social distancing was discussed today.   Assessment and Plan     Eliezer Lofts, MD

## 2020-09-14 NOTE — Patient Instructions (Signed)
Apply topical steroid cream. If redness spreading, increased heat... start antibiotics.

## 2020-09-27 DIAGNOSIS — H524 Presbyopia: Secondary | ICD-10-CM | POA: Diagnosis not present

## 2020-09-27 DIAGNOSIS — H35342 Macular cyst, hole, or pseudohole, left eye: Secondary | ICD-10-CM | POA: Diagnosis not present

## 2020-11-01 DIAGNOSIS — S90561A Insect bite (nonvenomous), right ankle, initial encounter: Secondary | ICD-10-CM | POA: Insufficient documentation

## 2020-11-01 NOTE — Assessment & Plan Note (Signed)
Apply topical steroid cream. If redness spreading, increased heat... start antibiotics.

## 2020-11-29 ENCOUNTER — Other Ambulatory Visit: Payer: Self-pay

## 2020-11-29 ENCOUNTER — Encounter (INDEPENDENT_AMBULATORY_CARE_PROVIDER_SITE_OTHER): Payer: Medicare HMO | Admitting: Ophthalmology

## 2020-11-29 DIAGNOSIS — H2513 Age-related nuclear cataract, bilateral: Secondary | ICD-10-CM | POA: Diagnosis not present

## 2020-11-29 DIAGNOSIS — H35373 Puckering of macula, bilateral: Secondary | ICD-10-CM | POA: Diagnosis not present

## 2020-11-29 DIAGNOSIS — H43813 Vitreous degeneration, bilateral: Secondary | ICD-10-CM | POA: Diagnosis not present

## 2020-11-29 DIAGNOSIS — H35342 Macular cyst, hole, or pseudohole, left eye: Secondary | ICD-10-CM

## 2020-12-18 ENCOUNTER — Other Ambulatory Visit: Payer: Self-pay | Admitting: Family Medicine

## 2020-12-18 ENCOUNTER — Telehealth: Payer: Self-pay | Admitting: Family Medicine

## 2020-12-18 NOTE — Telephone Encounter (Signed)
Information forwarded to pt in a mychart message.

## 2020-12-18 NOTE — Telephone Encounter (Signed)
Patient called in requesting a referral to have her yearly mammogram done .would like a call back . Wants to know if she need to be seen first . Please advise

## 2020-12-18 NOTE — Telephone Encounter (Signed)
No referral needed.   Please advise pt of the following  Please call the location of your choice from the menu below to schedule your Mammogram and/or Bone Density appointment.     [facility]   Phone:  [place], [place] [address]                                            Services: 3D Mammogram and Bone Density

## 2020-12-26 ENCOUNTER — Telehealth: Payer: Self-pay | Admitting: Family Medicine

## 2020-12-26 DIAGNOSIS — Z1231 Encounter for screening mammogram for malignant neoplasm of breast: Secondary | ICD-10-CM

## 2020-12-26 NOTE — Telephone Encounter (Signed)
Order placed

## 2020-12-26 NOTE — Telephone Encounter (Signed)
Mrs. Rhonda Reeves called in wanted to know about getting a order for her mammogram to go to [facility]

## 2020-12-28 NOTE — Telephone Encounter (Signed)
Pt made aware of order placed.

## 2021-01-18 ENCOUNTER — Telehealth: Payer: Self-pay

## 2021-01-18 ENCOUNTER — Other Ambulatory Visit: Payer: Self-pay

## 2021-01-18 ENCOUNTER — Emergency Department
Admission: EM | Admit: 2021-01-18 | Discharge: 2021-01-18 | Disposition: A | Discharge status: Home / Self Care | Payer: Medicare HMO | Attending: Emergency Medicine | Admitting: Emergency Medicine

## 2021-01-18 DIAGNOSIS — R109 Unspecified abdominal pain: Secondary | ICD-10-CM | POA: Diagnosis not present

## 2021-01-18 DIAGNOSIS — Z5321 Procedure and treatment not carried out due to patient leaving prior to being seen by health care provider: Secondary | ICD-10-CM | POA: Insufficient documentation

## 2021-01-18 DIAGNOSIS — R350 Frequency of micturition: Secondary | ICD-10-CM | POA: Insufficient documentation

## 2021-01-18 DIAGNOSIS — M545 Low back pain, unspecified: Secondary | ICD-10-CM | POA: Diagnosis not present

## 2021-01-18 LAB — BASIC METABOLIC PANEL
Anion gap: 9 (ref 5–15)
BUN: 14 mg/dL (ref 8–23)
CO2: 29 mmol/L (ref 22–32)
Calcium: 10 mg/dL (ref 8.9–10.3)
Chloride: 100 mmol/L (ref 98–111)
Creatinine, Ser: 0.94 mg/dL (ref 0.44–1.00)
GFR, Estimated: >60 mL/min (ref >60)
Glucose, Bld: 94 mg/dL (ref 70–99)
Potassium: 3.7 mmol/L (ref 3.5–5.1)
Sodium: 138 mmol/L (ref 135–145)

## 2021-01-18 LAB — CBC
HCT: 43.2 % (ref 36.0–46.0)
Hemoglobin: 15 g/dL (ref 12.0–15.0)
MCH: 31.7 pg (ref 26.0–34.0)
MCHC: 34.7 g/dL (ref 30.0–36.0)
MCV: 91.3 fL (ref 80.0–100.0)
Platelets: 306 10*3/uL (ref 150–400)
RBC: 4.73 MIL/uL (ref 3.87–5.11)
RDW: 12 % (ref 11.5–15.5)
WBC: 6.6 10*3/uL (ref 4.0–10.5)
nRBC: 0 % (ref 0.0–0.2)

## 2021-01-18 NOTE — Telephone Encounter (Signed)
Noted and agree. 

## 2021-01-18 NOTE — ED Provider Notes (Signed)
Emergency Medicine Provider Triage Evaluation Note  Rhonda Reeves, a 65 y.o. female  was evaluated in triage.  Pt complains of left lower back pain. She notes onset earlier in the week. She does give history of kidney stones, and endorses urinary urgency. Pain is aggravated by movement  Review of Systems  Positive: Right LBP/flank pain, urgency Negative: NVD, hematuria  Physical Exam  BP (!) 161/86 (BP Location: Left Arm)   Pulse 78   Temp 98.4 F (36.9 C) (Oral)   Resp 18   Ht 5\' 2"  (1.575 m)   Wt 63.3 kg   SpO2 97%   BMI 25.53 kg/m  Gen:   Awake, no distress  NAD Resp:  Normal effort CTA MSK:   Moves extremities without difficulty. Tender to palp over Right SI Other:  ABD: soft, nontender  Medical Decision Making  Medically screening exam initiated at 11:33 AM.  Appropriate orders placed.  Rhonda Reeves was informed that the remainder of the evaluation will be completed by another provider, this initial triage assessment does not replace that evaluation, and the importance of remaining in the ED until their evaluation is complete.  Patient with ED evaluation of right lower back/flank tenderness   Carmie End, Dannielle Karvonen, PA-C 01/18/21 1149    Carrie Mew, MD 01/19/21 2333

## 2021-01-18 NOTE — Telephone Encounter (Signed)
Pt and her husband walked in;pt has rt lower back pain that goes from dull to sharpe and pain is continuous;pain started about 1 wk ago but has significantly worsened and pt having frequency and urgency of urine and voiding small amts. Pt has pain upon urination and pain level now is 8. No abd pain, no blood in urine seen, and no fever. Hx of kidney stones with last kidney stone about 1 yr ago. Pt does not have a urologist that she is established with. Pts husband is going to take pt to [facility] ED. Pts husband works 2 days a wk at [employer] and knows some of urology staff and he may ck there to see if they could see pt or not; if not he will take pt to [facility] ED now. Sending note to Dr Einar Pheasant who of office, Conni Slipper NP who is in office and [place] CMA.

## 2021-01-18 NOTE — Telephone Encounter (Signed)
Agree with ER.

## 2021-01-18 NOTE — ED Triage Notes (Signed)
Pt c/o right flank pain , " I think I have a kidney stone". Pt  is in NAD at present

## 2021-01-19 LAB — URINALYSIS, COMPLETE (UACMP) WITH MICROSCOPIC
Bilirubin Urine: NEGATIVE
Glucose, UA: NEGATIVE mg/dL
Hgb urine dipstick: NEGATIVE
Ketones, ur: NEGATIVE mg/dL
Leukocytes,Ua: NEGATIVE
Nitrite: NEGATIVE
Protein, ur: NEGATIVE mg/dL
Specific Gravity, Urine: 1.003 — ABNORMAL LOW (ref 1.005–1.030)
Squamous Epithelial / HPF: NONE SEEN (ref 0–5)
pH: 8 (ref 5.0–8.0)

## 2021-01-22 ENCOUNTER — Other Ambulatory Visit: Payer: Self-pay

## 2021-01-22 ENCOUNTER — Telehealth: Payer: Self-pay | Admitting: Family Medicine

## 2021-01-22 ENCOUNTER — Emergency Department
Admission: EM | Admit: 2021-01-22 | Discharge: 2021-01-22 | Disposition: A | Discharge status: Home / Self Care | Payer: Medicare HMO | Attending: Emergency Medicine | Admitting: Emergency Medicine

## 2021-01-22 ENCOUNTER — Emergency Department: Payer: Medicare HMO

## 2021-01-22 DIAGNOSIS — S3992XA Unspecified injury of lower back, initial encounter: Secondary | ICD-10-CM | POA: Diagnosis present

## 2021-01-22 DIAGNOSIS — N200 Calculus of kidney: Secondary | ICD-10-CM | POA: Diagnosis not present

## 2021-01-22 DIAGNOSIS — Z87891 Personal history of nicotine dependence: Secondary | ICD-10-CM | POA: Diagnosis not present

## 2021-01-22 DIAGNOSIS — I700 Atherosclerosis of aorta: Secondary | ICD-10-CM | POA: Diagnosis not present

## 2021-01-22 DIAGNOSIS — R109 Unspecified abdominal pain: Secondary | ICD-10-CM | POA: Insufficient documentation

## 2021-01-22 DIAGNOSIS — S39012A Strain of muscle, fascia and tendon of lower back, initial encounter: Secondary | ICD-10-CM

## 2021-01-22 DIAGNOSIS — K429 Umbilical hernia without obstruction or gangrene: Secondary | ICD-10-CM | POA: Diagnosis not present

## 2021-01-22 DIAGNOSIS — Z9049 Acquired absence of other specified parts of digestive tract: Secondary | ICD-10-CM | POA: Diagnosis not present

## 2021-01-22 DIAGNOSIS — X58XXXA Exposure to other specified factors, initial encounter: Secondary | ICD-10-CM | POA: Insufficient documentation

## 2021-01-22 LAB — URINALYSIS, COMPLETE (UACMP) WITH MICROSCOPIC
Bacteria, UA: NONE SEEN
Bilirubin Urine: NEGATIVE
Glucose, UA: NEGATIVE mg/dL
Hgb urine dipstick: NEGATIVE
Ketones, ur: NEGATIVE mg/dL
Leukocytes,Ua: NEGATIVE
Nitrite: NEGATIVE
Protein, ur: NEGATIVE mg/dL
Specific Gravity, Urine: 1.002 — ABNORMAL LOW (ref 1.005–1.030)
Squamous Epithelial / HPF: NONE SEEN (ref 0–5)
pH: 8 (ref 5.0–8.0)

## 2021-01-22 LAB — COMPREHENSIVE METABOLIC PANEL
ALT: 17 U/L (ref 0–44)
AST: 21 U/L (ref 15–41)
Albumin: 4.7 g/dL (ref 3.5–5.0)
Alkaline Phosphatase: 64 U/L (ref 38–126)
Anion gap: 9 (ref 5–15)
BUN: 8 mg/dL (ref 8–23)
CO2: 27 mmol/L (ref 22–32)
Calcium: 9.8 mg/dL (ref 8.9–10.3)
Chloride: 102 mmol/L (ref 98–111)
Creatinine, Ser: 0.93 mg/dL (ref 0.44–1.00)
GFR, Estimated: >60 mL/min (ref >60)
Glucose, Bld: 90 mg/dL (ref 70–99)
Potassium: 3.8 mmol/L (ref 3.5–5.1)
Sodium: 138 mmol/L (ref 135–145)
Total Bilirubin: 1 mg/dL (ref 0.3–1.2)
Total Protein: 7.6 g/dL (ref 6.5–8.1)

## 2021-01-22 LAB — CBC WITH DIFFERENTIAL/PLATELET
Abs Immature Granulocytes: 0.01 10*3/uL (ref 0.00–0.07)
Basophils Absolute: 0 10*3/uL (ref 0.0–0.1)
Basophils Relative: 1 %
Eosinophils Absolute: 0.1 10*3/uL (ref 0.0–0.5)
Eosinophils Relative: 1 %
HCT: 44.8 % (ref 36.0–46.0)
Hemoglobin: 15.6 g/dL — ABNORMAL HIGH (ref 12.0–15.0)
Immature Granulocytes: 0 %
Lymphocytes Relative: 30 %
Lymphs Abs: 2.1 10*3/uL (ref 0.7–4.0)
MCH: 31.3 pg (ref 26.0–34.0)
MCHC: 34.8 g/dL (ref 30.0–36.0)
MCV: 90 fL (ref 80.0–100.0)
Monocytes Absolute: 0.4 10*3/uL (ref 0.1–1.0)
Monocytes Relative: 6 %
Neutro Abs: 4.4 10*3/uL (ref 1.7–7.7)
Neutrophils Relative %: 62 %
Platelets: 313 10*3/uL (ref 150–400)
RBC: 4.98 MIL/uL (ref 3.87–5.11)
RDW: 12 % (ref 11.5–15.5)
WBC: 7.1 10*3/uL (ref 4.0–10.5)
nRBC: 0 % (ref 0.0–0.2)

## 2021-01-22 LAB — LIPASE, BLOOD: Lipase: 32 U/L (ref 11–51)

## 2021-01-22 LAB — LACTIC ACID, PLASMA: Lactic Acid, Venous: 0.9 mmol/L (ref 0.5–1.9)

## 2021-01-22 MED ORDER — METHOCARBAMOL 500 MG PO TABS: 500.0000 mg | ORAL_TABLET | Freq: Four times a day (QID) | ORAL | 1 refills | Status: DC

## 2021-01-22 MED ORDER — METHOCARBAMOL 500 MG PO TABS
1000.0000 mg | ORAL_TABLET | Freq: Once | ORAL | Status: AC
  Administered 2021-01-22: 1000 mg via ORAL
  Filled 2021-01-22: qty 2

## 2021-01-22 MED ORDER — MELOXICAM 15 MG PO TABS: 15.0000 mg | ORAL_TABLET | Freq: Every day | ORAL | 0 refills | Status: DC

## 2021-01-22 MED ORDER — MELOXICAM 7.5 MG PO TABS
15.0000 mg | ORAL_TABLET | Freq: Once | ORAL | Status: AC
  Administered 2021-01-22: 15 mg via ORAL
  Filled 2021-01-22: qty 2

## 2021-01-22 NOTE — ED Notes (Signed)
Report received from Amy, RN.

## 2021-01-22 NOTE — Telephone Encounter (Signed)
I spoke with pt; pt said that she did go to [facility] ED on 01/18/21; pt said she waited 3 hrs and her pain had subsided and pt left ED without being seen. Pt said the pain started again on 01/19/21  and has had dull pain if sitting or sharpe pain if moving around in rt lower back. Pt said the pain is continuous since 01/19/21;  pt also has frequency and urgency of urine. Pt is not having abd pain, no fever and no blood in urine seen., pt said the pain has worsened and pt is now at pain level of 8.pt wants to know if she can be seen today. Alyse Low CMA said Dr Einar Pheasant does not have any available appts today but Druscilla Brownie NP  has available appt this afternoon. Juliann Pulse RN with [facility] said could schedule with [place] as acute visit but possible pt will have to go back to hospital if needs further testing such as imaging. I offered pt an appt this afternoon with above info and pt said she will just go back to [facility] ED now. Pt had ask that I call for her to [facility] ED to let them know she is coming. I spoke with triage nurse, Cecille Rubin at [facility] ED, and let her know pt is coming in and above info. Pt will be triaged when pt arrives. Sending note to Dr Einar Pheasant and [facility] CMA.

## 2021-01-22 NOTE — ED Notes (Signed)
Patient given discharge instructions, all questions answered. Patient in possession of all belongings, directed to the discharge area  

## 2021-01-22 NOTE — ED Provider Notes (Signed)
[facility] Emergency Department Provider Note  ____________________________________________  Time seen: Approximately 3:44 PM  I have reviewed the triage vital signs and the nursing notes.   HISTORY  Chief Complaint Flank Pain    HPI Rhonda Reeves is a 65 y.o. female who presents the emergency department for right flank pain.  Patient states that flank pain started 4 days ago.  [place] beach when she noticed that the pain in her right flank started.  She came to the emergency department 3 days ago, but it was a lengthy wait and patient states the pain resolved while she was in the waiting room.  Patient left after being seen in triage.  Patient then had a return of the pain that has been constant.  Reports frequency but no dysuria, hematuria.  No fevers or chills.  No nausea, vomiting, diarrhea or constipation.  Patient does have a history of kidney stones and states that she believes that she has a kidney stone on the right.       Past Medical History:  Diagnosis Date   Depression    Glaucoma    History of kidney stones    H/O   Osteoporosis     Patient Active Problem List   Diagnosis Date Noted   Insect bite of right ankle 11/01/2020   Elevated blood pressure reading 08/20/2020   Osteopenia 02/28/2020   Hyperlipidemia 01/10/2020   Skin lesion of cheek 01/10/2020   Tinnitus 08/01/2019   Bipolar disorder ([place]) 08/01/2019   Recurrent major depressive disorder, in partial remission ([place]) 08/01/2019   Abdominal weakness 08/01/2019   S/P breast lumpectomy 03/22/2018   Radial scar of breast    Anxiety 02/19/2017   Depression, recurrent ([place]) 01/18/2015   MCI (mild cognitive impairment) with memory loss 09/13/2013    Past Surgical History:  Procedure Laterality Date   ABDOMINAL HYSTERECTOMY     has 1 ovary left   BREAST BIOPSY Right 02/24/2018   affirm bx, coil clip, radial scar   BREAST EXCISIONAL BIOPSY Right 03/09/2018    Fibercystic change FOCAL USUAL DUCT HYPERPLASIA NEG MARGINS   BREAST LUMPECTOMY Right 03/09/2018   radial scar   BREAST LUMPECTOMY WITH NEEDLE LOCALIZATION Right 03/09/2018   Procedure: BREAST LUMPECTOMY WITH NEEDLE LOCALIZATION;  Surgeon: Fredirick Maudlin, MD;  Location: [facility] ORS;  Service: General;  Laterality: Right;   CHOLECYSTECTOMY N/A 11/21/2017   Procedure: LAPAROSCOPIC CHOLECYSTECTOMY WITH INTRAOPERATIVE CHOLANGIOGRAM;  Surgeon: Florene Glen, MD;  Location: [facility] ORS;  Service: General;  Laterality: N/A;   ENDOSCOPIC RETROGRADE CHOLANGIOPANCREATOGRAPHY (ERCP) WITH PROPOFOL N/A 11/20/2017   Procedure: ENDOSCOPIC RETROGRADE CHOLANGIOPANCREATOGRAPHY (ERCP) WITH PROPOFOL;  Surgeon: Lucilla Lame, MD;  Location: [facility] ENDOSCOPY;  Service: Endoscopy;  Laterality: N/A;   HEMORRHOID SURGERY      Prior to Admission medications   Medication Sig Start Date End Date Taking? Authorizing Provider  meloxicam (MOBIC) 15 MG tablet Take 1 tablet (15 mg total) by mouth daily. 01/22/21  Yes Yaquelin Langelier, Charline Bills, PA-C  methocarbamol (ROBAXIN) 500 MG tablet Take 1 tablet (500 mg total) by mouth 4 (four) times daily. 01/22/21  Yes Denali Becvar, Charline Bills, PA-C  Calcium Carb-Cholecalciferol (CALCIUM 600 + D PO) Take 1 tablet by mouth daily.    [provider]  cephALEXin (KEFLEX) 500 MG capsule Take 1 capsule (500 mg total) by mouth 3 (three) times daily. Fill if redness spreading or heat in right ankle. VOID after 09/2020 09/14/20   Jinny Sanders, MD  FLUoxetine (PROZAC) 10 MG capsule TAKE ONE CAPSULE BY MOUTH ONE TIME DAILY 07/04/20   Lesleigh Noe, MD  lamoTRIgine (LAMICTAL) 200 MG tablet TAKE ONE TABLET BY MOUTH ONE TIME DAILY 07/04/20   Lesleigh Noe, MD  Multiple Vitamins-Minerals (MULTIVITAMIN WITH MINERALS) tablet Take 1 tablet by mouth daily.    [provider]  mupirocin ointment (BACTROBAN) 2 % 1 application 3 (three) times daily as needed.    [provider]  OLANZapine  (ZYPREXA) 5 MG tablet Take 0.5 tablets (2.5 mg total) by mouth every morning. 08/20/20   Lesleigh Noe, MD  triamcinolone cream (KENALOG) 0.5 % Apply 1 application topically 2 (two) times daily. 09/14/20   Jinny Sanders, MD    Allergies Sulfa antibiotics  Family History  Problem Relation Age of Onset   Osteoporosis Mother    Berenice Primas' disease Mother    Osteoporosis Father    Hernia Father    Heart disease Father    Kidney Stones Father    Hypotension Father    Healthy Brother    Osteoporosis Paternal Grandmother     Social History Social History   Tobacco Use   Smoking status: Former    Packs/day: 1.00    Years: 35.00    Pack years: 35.00    Types: Cigarettes    Quit date: 03/03/2005    Years since quitting: 15.9   Smokeless tobacco: Never  Vaping Use   Vaping Use: Never used  Substance Use Topics   Alcohol use: Yes    Alcohol/week: 1.0 standard drink    Types: 1 Cans of beer per week    Comment: 1 BEER EVERDAY   Drug use: No     Review of Systems  Constitutional: No fever/chills Eyes: No visual changes. No discharge ENT: No upper respiratory complaints. Cardiovascular: no chest pain. Respiratory: no cough. No SOB. Gastrointestinal: Right flank pain no abdominal pain.  No nausea, no vomiting.  No diarrhea.  No constipation. Genitourinary: Negative for dysuria. No hematuri Musculoskeletal: Negative for musculoskeletal pain. Skin: Negative for rash, abrasions, lacerations, ecchymosis. Neurological: Negative for headaches, focal weakness or numbness.  10 System ROS otherwise negative.  ____________________________________________   PHYSICAL EXAM:  VITAL SIGNS: ED Triage Vitals  Enc Vitals Group     BP 01/22/21 1257 (!) 149/84     Pulse Rate 01/22/21 1257 74     Resp 01/22/21 1257 18     Temp 01/22/21 1257 98.4 F (36.9 C)     Temp Source 01/22/21 1257 Oral     SpO2 01/22/21 1257 95 %     Weight 01/22/21 1258 138 lb 9.6 oz (62.9 kg)     Height  01/22/21 1258 5\' 2"  (1.575 m)     Head Circumference --      Peak Flow --      Pain Score 01/22/21 1258 8     Pain Loc --      Pain Edu? --      Excl. in [place]? --      Constitutional: Alert and oriented. Well appearing and in no acute distress. Eyes: Conjunctivae are normal. PERRL. EOMI. Head: Atraumatic. ENT:      Ears:       Nose: No congestion/rhinnorhea.      Mouth/Throat: Mucous membranes are moist.  Neck: No stridor.   Hematological/Lymphatic/Immunilogical: No cervical lymphadenopathy. Cardiovascular: Normal rate, regular rhythm. Normal S1 and S2.  Good peripheral circulation. Respiratory: Normal respiratory effort without tachypnea or retractions. Lungs CTAB.  Good air entry to the bases with no decreased or absent breath sounds. Gastrointestinal: Bowel sounds 4 quadrants. Soft and nontender to palpation. No guarding or rigidity. No palpable masses. No distention.  Right-sided CVA tenderness. Musculoskeletal: Full range of motion to all extremities. No gross deformities appreciated. Neurologic:  Normal speech and language. No gross focal neurologic deficits are appreciated.  Skin:  Skin is warm, dry and intact. No rash noted. Psychiatric: Mood and affect are normal. Speech and behavior are normal. Patient exhibits appropriate insight and judgement.   ____________________________________________   LABS (all labs ordered are listed, but only abnormal results are displayed)  Labs Reviewed  URINALYSIS, COMPLETE (UACMP) WITH MICROSCOPIC - Abnormal; Notable for the following components:      Result Value   Color, Urine STRAW (*)    APPearance CLEAR (*)    Specific Gravity, Urine 1.002 (*)    All other components within normal limits  CBC WITH DIFFERENTIAL/PLATELET - Abnormal; Notable for the following components:   Hemoglobin 15.6 (*)    All other components within normal limits  COMPREHENSIVE METABOLIC PANEL  LACTIC ACID, PLASMA  LIPASE, BLOOD  LACTIC ACID, PLASMA    ____________________________________________  EKG   ____________________________________________  RADIOLOGY I personally viewed and evaluated these images as part of my medical decision making, as well as reviewing the written report by the radiologist.  ED Provider Interpretation: No acute findings on CT scan to explain patient's symptoms.  Patient has bilateral kidney stones which do not appear to be in the ureters.  CT Renal Stone Study  Result Date: 01/22/2021 CLINICAL DATA:  Right-sided flank pain EXAM: CT ABDOMEN AND PELVIS WITHOUT CONTRAST TECHNIQUE: Multidetector CT imaging of the abdomen and pelvis was performed following the standard protocol without IV contrast. COMPARISON:  None. FINDINGS: Lower chest: Lung bases are clear. Hepatobiliary: No focal liver abnormality is seen. Status post cholecystectomy. No biliary dilatation. Pancreas: Unremarkable. No pancreatic ductal dilatation or surrounding inflammatory changes. Spleen: Normal in size without focal abnormality. Adrenals/Urinary Tract: Adrenal glands are normal. No hydronephrosis. Bilateral intrarenal stones, measuring up to 4 mm lower pole right and 4 mm mid left kidney. No ureteral stone. Bladder is unremarkable Stomach/Bowel: Stomach is within normal limits. Appendix appears normal. No evidence of bowel wall thickening, distention, or inflammatory changes. Diverticular disease of the colon without acute wall thickening. Vascular/Lymphatic: Moderate aortic atherosclerosis. No aneurysm. No suspicious nodes Reproductive: Status post hysterectomy. No adnexal masses. Other: Negative for pelvic effusion or free air. Fat containing supraumbilical and umbilical hernias. Musculoskeletal: No acute or significant osseous findings. IMPRESSION: 1. No CT evidence for acute intra-abdominal or intrapelvic abnormality. 2. Bilateral kidney stones. 3. 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   On: 01/22/2021 16:48    ____________________________________________    PROCEDURES  Procedure(s) performed:    Procedures    Medications  meloxicam (MOBIC) tablet 15 mg (has no administration in time range)  methocarbamol (ROBAXIN) tablet 1,000 mg (has no administration in time range)     ____________________________________________   INITIAL IMPRESSION / ASSESSMENT AND PLAN / ED COURSE  Pertinent labs & imaging results that were available during my care of the patient were reviewed by me and considered in my medical decision making (see chart for details).  Review of the [place] CSRS was performed in accordance of the [place] prior to dispensing any controlled drugs.           Patient's diagnosis is  consistent with lumbar strain.  Patient presented to the emergency department with right flank/back pain.  She has a history of nephrolithiasis and felt like symptoms were similar.  She had no urinary changes and no hematuria.  Urinalysis was reassuring.  Patient had labs as well as CT scan.  Bilateral nephrolithiasis without evidence of ureteral lithiasis.  At this time I do not feel that these are causing patient's symptoms.  Based off the increased activity, physical exam I feel that this is more musculoskeletal.  Patient will be treated with anti-inflammatory and muscle relaxer.  Patient's kidney function is reassuring and she cannot tolerate NSAIDs.  Patient is cautioned on the use of a muscle relaxer and its side effect profile.  Return precautions discussed with the patient and her husband.  Follow-up primary care as needed. Patient is given ED precautions to return to the ED for any worsening or new symptoms.     ____________________________________________  FINAL CLINICAL IMPRESSION(S) / ED DIAGNOSES  Final diagnoses:  Strain of lumbar region, initial encounter      NEW MEDICATIONS STARTED DURING THIS VISIT:  ED Discharge Orders          Ordered     meloxicam (MOBIC) 15 MG tablet  Daily        01/22/21 2007    methocarbamol (ROBAXIN) 500 MG tablet  4 times daily        01/22/21 2007                This chart was dictated using voice recognition software/Dragon. Despite best efforts to proofread, errors can occur which can change the meaning. Any change was purely unintentional.    Darletta Moll, PA-C 01/22/21 2009    Naaman Plummer, MD 01/23/21 1329

## 2021-01-22 NOTE — ED Triage Notes (Signed)
Pt here with right side flank pain. Pt was here Friday but left due to the wait. Pt believes that she may have a kidney stone.

## 2021-01-22 NOTE — ED Triage Notes (Signed)
This RN contacted by pt pcp. PCP reported that pt came to ED on 01/18/21 for right lower back pain but LWBS. PCP reported pt pain has been constant since 10/1 and would like pt evaluated for possible kidney stone. C/o urinary frequency/urgency/ denies blood in urine.

## 2021-01-22 NOTE — Telephone Encounter (Signed)
Agree with need for evaluation.  

## 2021-01-22 NOTE — ED Notes (Signed)
See triage note  presents with lower back pain  states pain is mainly on right flank area  thinks she may have a stone

## 2021-01-22 NOTE — Telephone Encounter (Signed)
PLEASE NOTE: All timestamps contained within this report are represented as [place] Standard Time. CONFIDENTIALTY NOTICE: This fax transmission is intended only for the addressee. It contains information that is legally privileged, confidential or otherwise protected from use or disclosure. If you are not the intended recipient, you are strictly prohibited from reviewing, disclosing, copying using or disseminating any of this information or taking any action in reliance on or regarding this information. If you have received this fax in error, please notify us immediately by telephone so that we can arrange for its return to [place]. Phone: [phone], Toll-Free: [phone], Fax: [phone] Page: 1 of 2 Call Id: [id number] Schneider RECORD AccessNurse Patient Name: [facility] Gender: Female DOB: 05-Jul-1955 Age: 65 Y 72 M 9 D Return Phone Number: [phone] (Primary), [phone] (Secondary) Address: [place] [address] Client  [facility] [place] Night - Client Client Site [place] Physician Waunita Schooner- MD Contact Type Call Who Is Calling Patient / Member / Family / Caregiver Call Type Triage / Clinical Caller Name Vear Staton Relationship To Patient Spouse Return Phone Number [phone] (Secondary) Chief Complaint Back Pain - General Reason for Call Request to Schedule Office Appointment Initial Comment Caller states his wife pulled a muscle on Thursday and is painful in her lower back. Went to ER and they rulled out kidney stone. Translation No Nurse Assessment Nurse: Cira Servant, RN, Suanne Marker Date/Time (Eastern Time): 01/22/2021 11:58:40 AM Confirm and document reason for call. If symptomatic, describe symptoms. ---Caller states his wife pulled a muscle on Thursday and is painful in her lower back. Was told to return to ED. Lower right back  pain (right flank area). Does the patient have any new or worsening symptoms? ---Yes Will a triage be completed? ---Yes Related visit to physician within the last 2 weeks? ---Yes Does the PT have any chronic conditions? (i.e. diabetes, asthma, this includes High risk factors for pregnancy, etc.) ---Yes List chronic conditions. ---hx kidney stones; Is this a behavioral health or substance abuse call? ---No Guidelines Guideline Title Affirmed Question Affirmed Notes Nurse Date/Time (Eastern Time) Flank Pain [1] SEVERE pain (e.g., excruciating, scale 8-10) AND [2] present > 1 hour Cira Servant, RN, Suanne Marker 01/22/2021 12:02:42 PM Disp. Time Eilene Ghazi Time) Disposition Final User 01/22/2021 9:10:13 AM Attempt made - message left Rolena Infante, RN, Patrice PLEASE NOTE: All timestamps contained within this report are represented as [place] Standard Time. CONFIDENTIALTY NOTICE: This fax transmission is intended only for the addressee. It contains information that is legally privileged, confidential or otherwise protected from use or disclosure. If you are not the intended recipient, you are strictly prohibited from reviewing, disclosing, copying using or disseminating any of this information or taking any action in reliance on or regarding this information. If you have received this fax in error, please notify us immediately by telephone so that we can arrange for its return to [place]. Phone: [phone], Toll-Free: [phone], Fax: [phone] Page: 2 of 2 Call Id: [id number] [place]. Time Eilene Ghazi Time) Disposition Final User 01/22/2021 9:42:30 AM FINAL ATTEMPT MADE - message left Rolena Infante RN, Patrice 01/22/2021 9:42:37 AM Send to RN Final Attempt Mckinley Jewel, RN, Patrice 01/22/2021 12:04:05 PM Go to ED Now Yes Cira Servant, RN, Rosalyn Charters Disagree/Comply Comply Caller Understands Yes PreDisposition Go to ED Care Advice Given Per Guideline GO TO ED NOW: * You need to be seen in the Emergency Department. * Go  to the ED at  ___________ Hospital. * Leave now. Drive carefully. CARE ADVICE given per Flank Pain (Adult) guideline. Referrals [facility] - ED

## 2021-01-22 NOTE — Telephone Encounter (Signed)
Pt would like a return call...[phone]

## 2021-02-05 ENCOUNTER — Ambulatory Visit
Admission: RE | Admit: 2021-02-05 | Discharge: 2021-02-05 | Disposition: A | Discharge status: Home / Self Care | Payer: Medicare HMO | Source: Ambulatory Visit | Attending: Primary Care | Admitting: Primary Care

## 2021-02-05 ENCOUNTER — Encounter: Payer: Self-pay | Admitting: Family Medicine

## 2021-02-05 ENCOUNTER — Other Ambulatory Visit: Payer: Self-pay

## 2021-02-05 ENCOUNTER — Ambulatory Visit (INDEPENDENT_AMBULATORY_CARE_PROVIDER_SITE_OTHER): Payer: Medicare HMO | Admitting: Family Medicine

## 2021-02-05 ENCOUNTER — Encounter: Payer: Self-pay | Admitting: General Surgery

## 2021-02-05 VITALS — BP 112/78 | HR 71 | Temp 97.0°F | Ht 62.25 in | Wt 141.4 lb

## 2021-02-05 DIAGNOSIS — Z0000 Encounter for general adult medical examination without abnormal findings: Secondary | ICD-10-CM | POA: Diagnosis not present

## 2021-02-05 DIAGNOSIS — E782 Mixed hyperlipidemia: Secondary | ICD-10-CM

## 2021-02-05 DIAGNOSIS — Z1231 Encounter for screening mammogram for malignant neoplasm of breast: Secondary | ICD-10-CM | POA: Diagnosis not present

## 2021-02-05 LAB — LIPID PANEL
Cholesterol: 249 mg/dL — ABNORMAL HIGH (ref 0–200)
HDL: 88.9 mg/dL (ref >39.00)
LDL Cholesterol: 143 mg/dL — ABNORMAL HIGH (ref 0–99)
NonHDL: 160.54
Total CHOL/HDL Ratio: 3
Triglycerides: 90 mg/dL (ref 0.0–149.0)
VLDL: 18 mg/dL (ref 0.0–40.0)

## 2021-02-05 NOTE — Patient Instructions (Signed)
Will plan to get Shingles and Pneumonia-23 in the future

## 2021-02-05 NOTE — Progress Notes (Signed)
Subjective:   Rhonda Reeves is a 65 y.o. female who presents for Medicare Annual (Subsequent) preventive examination.  Review of Systems    Review of Systems  Constitutional:  Negative for chills and fever.  HENT:  Negative for congestion and sore throat.   Eyes:  Negative for blurred vision and double vision.  Respiratory:  Negative for shortness of breath.   Cardiovascular:  Negative for chest pain.  Gastrointestinal:  Negative for heartburn, nausea and vomiting.  Genitourinary: Negative.   Musculoskeletal: Negative.  Negative for myalgias.  Skin:  Negative for rash.  Neurological:  Negative for dizziness and headaches.  Endo/Heme/Allergies:  Does not bruise/bleed easily.  Psychiatric/Behavioral:  Negative for depression. The patient is not nervous/anxious.    Cardiac Risk Factors include: advanced age (>65men, >46 women);dyslipidemia;smoking/ tobacco exposure     Objective:    Today's Vitals   02/05/21 0907  BP: 112/78  Pulse: 71  Temp: (!) 97 F (36.1 C)  TempSrc: Temporal  SpO2: 97%  Weight: 141 lb 6 oz (64.1 kg)  Height: 5' 2.25" (1.581 m)   Body mass index is 25.65 kg/m.  Advanced Directives 02/05/2021 01/22/2021 01/18/2021 02/02/2020 11/18/2017  Does Patient Have a Medical Advance Directive? Yes No No Yes Yes  Type of Paramedic of [place];Living will - - [place];Living will Living will;Healthcare Power of Attorney  Does patient want to make changes to medical advance directive? No - Patient declined - - No - Patient declined No - Patient declined  Copy of [place] in Chart? Yes - validated most recent copy scanned in chart (See row information) - - No - copy requested No - copy requested  Would patient like information on creating a medical advance directive? - No - Patient declined No - Patient declined - No - Patient declined    Current Medications (verified) Outpatient Encounter Medications  as of 02/05/2021  Medication Sig   Calcium Carb-Cholecalciferol (CALCIUM 600 + D PO) Take 1 tablet by mouth daily.   FLUoxetine (PROZAC) 10 MG capsule TAKE ONE CAPSULE BY MOUTH ONE TIME DAILY   lamoTRIgine (LAMICTAL) 200 MG tablet TAKE ONE TABLET BY MOUTH ONE TIME DAILY   Multiple Vitamins-Minerals (MULTIVITAMIN WITH MINERALS) tablet Take 1 tablet by mouth daily.   OLANZapine (ZYPREXA) 2.5 MG tablet Take 2.5 mg by mouth at bedtime.   [DISCONTINUED] OLANZapine (ZYPREXA) 5 MG tablet Take 0.5 tablets (2.5 mg total) by mouth every morning.   [DISCONTINUED] cephALEXin (KEFLEX) 500 MG capsule Take 1 capsule (500 mg total) by mouth 3 (three) times daily. Fill if redness spreading or heat in right ankle. VOID after 09/2020   [DISCONTINUED] meloxicam (MOBIC) 15 MG tablet Take 1 tablet (15 mg total) by mouth daily.   [DISCONTINUED] methocarbamol (ROBAXIN) 500 MG tablet Take 1 tablet (500 mg total) by mouth 4 (four) times daily.   [DISCONTINUED] mupirocin ointment (BACTROBAN) 2 % 1 application 3 (three) times daily as needed.   [DISCONTINUED] triamcinolone cream (KENALOG) 0.5 % Apply 1 application topically 2 (two) times daily.   No facility-administered encounter medications on file as of 02/05/2021.    Allergies (verified) Sulfa antibiotics   History: Past Medical History:  Diagnosis Date   Depression    Glaucoma    History of kidney stones    H/O   Osteoporosis    Past Surgical History:  Procedure Laterality Date   ABDOMINAL HYSTERECTOMY     has 1 ovary left   BREAST  BIOPSY Right 02/24/2018   affirm bx, coil clip, radial scar   BREAST EXCISIONAL BIOPSY Right 03/09/2018   Fibercystic change FOCAL USUAL DUCT HYPERPLASIA NEG MARGINS   BREAST LUMPECTOMY Right 03/09/2018   radial scar   BREAST LUMPECTOMY WITH NEEDLE LOCALIZATION Right 03/09/2018   Procedure: BREAST LUMPECTOMY WITH NEEDLE LOCALIZATION;  Surgeon: Fredirick Maudlin, MD;  Location: [facility] ORS;  Service: General;  Laterality:  Right;   CHOLECYSTECTOMY N/A 11/21/2017   Procedure: LAPAROSCOPIC CHOLECYSTECTOMY WITH INTRAOPERATIVE CHOLANGIOGRAM;  Surgeon: Florene Glen, MD;  Location: [facility] ORS;  Service: General;  Laterality: N/A;   ENDOSCOPIC RETROGRADE CHOLANGIOPANCREATOGRAPHY (ERCP) WITH PROPOFOL N/A 11/20/2017   Procedure: ENDOSCOPIC RETROGRADE CHOLANGIOPANCREATOGRAPHY (ERCP) WITH PROPOFOL;  Surgeon: Lucilla Lame, MD;  Location: [facility] ENDOSCOPY;  Service: Endoscopy;  Laterality: N/A;   HEMORRHOID SURGERY     Family History  Problem Relation Age of Onset   Osteoporosis Mother    Graves' disease Mother    Osteoporosis Father    Hernia Father    Heart disease Father    Kidney Stones Father    Hypotension Father    Healthy Brother    Osteoporosis Paternal Grandmother    Social History   Socioeconomic History   Marital status: Married    Spouse name: Fletcher Anon   Number of children: 0   Years of education: bachelors degree   Highest education level: Not on file  Occupational History   Not on file  Tobacco Use   Smoking status: Former    Packs/day: 1.00    Years: 35.00    Pack years: 35.00    Types: Cigarettes    Quit date: 03/03/2005    Years since quitting: 15.9   Smokeless tobacco: Never  Vaping Use   Vaping Use: Never used  Substance and Sexual Activity   Alcohol use: Yes    Alcohol/week: 1.0 standard drink    Types: 1 Cans of beer per week    Comment: 1 BEER EVERDAY   Drug use: No   Sexual activity: Yes    Birth control/protection: Surgical  Other Topics Concern   Not on file  Social History Narrative   08/01/19   From[place] came [place]   Living: with husband Saralyn Pilar (2006) - but also high school sweethearts   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      Family: mother in [place] and brother in [place] - good relationship      Enjoys: hike, gardening, watching movies      Exercise: walking 5 nights a week, stretches and weights in the morning   Diet: oatmeal  breakfast, yogurt/banana/apple lunch, brown rice + veggies, veggies w/ pasta, pizza - chicken/salmon       Safety   Seat belts: Yes    Guns: no   Safe in relationships: Yes    Social Determinants of Radio broadcast assistant Strain: Not on file  Food Insecurity: Not on file  Transportation Needs: Not on file  Physical Activity: Not on file  Stress: Not on file  Social Connections: Not on file    Tobacco Counseling Counseling given: Not Answered   Clinical Intake:  Pre-visit preparation completed: Yes  Pain : No/denies pain     BMI - recorded: 25.65 Nutritional Status: BMI 25 -29 Overweight Nutritional Risks: None Diabetes: No  How often do you need to have someone help you when you read instructions, pamphlets, or other written materials from your doctor or pharmacy?: 1 - Never What is  the last grade level you completed in school?: college  Diabetic?no  Interpreter Needed?: No      Activities of Daily Living In your present state of health, do you have any difficulty performing the following activities: 02/05/2021  Hearing? Y  Comment is planning to get audiology  Vision? N  Difficulty concentrating or making decisions? N  Walking or climbing stairs? N  Dressing or bathing? N  Doing errands, shopping? N  Preparing Food and eating ? N  Using the Toilet? N  In the past six months, have you accidently leaked urine? N  Do you have problems with loss of bowel control? N  Managing your Medications? N  Managing your Finances? N  Housekeeping or managing your Housekeeping? N  Some recent data might be hidden    Patient Care Team: Lesleigh Noe, MD as PCP - General (Family Medicine) Hayden Pedro, MD as Consulting Physician (Ophthalmology)  Indicate any recent Medical Services you may have received from other than [facility] providers in the past year (date may be approximate).     Assessment:   This is a routine wellness examination for  Ellary.  Hearing/Vision screen Hearing Screening   250Hz  500Hz  1000Hz  2000Hz  4000Hz   Right ear 20 20 25  0 0  Left ear 20 20 20 20  0  Vision Screening - Comments:: Last eye exam in September 2022 at Dr. Zigmund Daniel in [place]   Dietary issues and exercise activities discussed: Current Exercise Habits: Home exercise routine, Type of exercise: walking, Time (Minutes): 45, Frequency (Times/Week): 6, Weekly Exercise (Minutes/Week): 270, Intensity: Moderate, Exercise limited by: None identified   Goals Addressed             This Visit's Progress    Weight (lb) < 130 lb (59 kg)   141 lb 6 oz (64.1 kg)    Working to continue to lose weight      Depression Screen PHQ 2/9 Scores 08/20/2020 02/02/2020 02/02/2020 05/16/2019 01/10/2019 01/20/2018 03/31/2017  PHQ - 2 Score 2 0 0 0 0 0 0  PHQ- 9 Score 7 - - 0 - 2 -    Fall Risk Fall Risk  02/05/2021 02/02/2020 02/02/2020 03/03/2018 03/31/2017  Falls in the past year? 0 0 0 0 No  Number falls in past yr: 0 0 0 - -  Injury with Fall? - 0 - - -  Risk for fall due to : - No Fall Risks - - -  Follow up - Falls evaluation completed - - -    FALL RISK PREVENTION PERTAINING TO THE HOME:  Any stairs in or around the home? Yes  If so, are there any without handrails? Yes  Home free of loose throw rugs in walkways, pet beds, electrical cords, etc? Yes  Adequate lighting in your home to reduce risk of falls? Yes   ASSISTIVE DEVICES UTILIZED TO PREVENT FALLS:  Life alert? No  Use of a cane, walker or w/c? No  Grab bars in the bathroom? No  Shower chair or bench in shower? No  Elevated toilet seat or a handicapped toilet? No     Cognitive Function:     Mini-Cog - 02/05/21 0920     Normal clock drawing test? yes    How many words correct? 3                  Immunizations Immunization History  Administered Date(s) Administered   Fluad Quad(high Dose 65+) 01/10/2020, 01/04/2021   Influenza Inj Mdck Quad  Pf 02/07/2017    Influenza,inj,Quad PF,6+ Mos 06/26/2009, 01/18/2015, 01/30/2016, 01/10/2019   PFIZER(Purple Top)SARS-COV-2 Vaccination 07/23/2019, 08/17/2019, 02/04/2020   Pneumococcal Conjugate-13 01/10/2020   Tdap 07/04/2014   Zoster, Live 03/29/2015    TDAP status: Up to date  Flu Vaccine status: Up to date  Pneumococcal vaccine status: Up to date  Covid-19 vaccine status: Information provided on how to obtain vaccines.   Qualifies for Shingles Vaccine? Yes   Zostavax completed Yes   Shingrix Completed?: No.    Education has been provided regarding the importance of this vaccine. Patient has been advised to call insurance company to determine out of pocket expense if they have not yet received this vaccine. Advised may also receive vaccine at local pharmacy or Health Dept. Verbalized acceptance and understanding.  Screening Tests Health Maintenance  Topic Date Due   Zoster Vaccines- Shingrix (1 of 2) Never done   COVID-19 Vaccine (4 - Booster for Pfizer series) 06/06/2020   MAMMOGRAM  01/25/2022   TETANUS/TDAP  07/03/2024   COLONOSCOPY (Pts 45-70yrs Insurance coverage will need to be confirmed)  07/17/2029   INFLUENZA VACCINE  Completed   DEXA SCAN  Completed   Hepatitis C Screening  Completed   HPV VACCINES  Aged Out    Health Maintenance  Health Maintenance Due  Topic Date Due   Zoster Vaccines- Shingrix (1 of 2) Never done   COVID-19 Vaccine (4 - Booster for Pfizer series) 06/06/2020    Colorectal cancer screening: Type of screening: Colonoscopy. Completed 2021. Repeat every 3 years  Mammogram status: Completed 02/05/2021. Repeat every year  Bone Density status: Completed 2021. Results reflect: Bone density results: OSTEOPENIA. Repeat every 3 years. Social History   Tobacco Use  Smoking Status Former   Packs/day: 1.00   Years: 35.00   Pack years: 35.00   Types: Cigarettes   Quit date: 03/03/2005   Years since quitting: 15.9  Smokeless Tobacco Never     Lung Cancer  Screening: (Low Dose CT Chest recommended if Age 61-80 years, 30 pack-year currently smoking OR have quit w/in 15years.) does not qualify.   Lung Cancer Screening Referral: n/a  Additional Screening:  Hepatitis C Screening: does qualify; Completed not done  Vision Screening: Recommended annual ophthalmology exams for early detection of glaucoma and other disorders of the eye. Is the patient up to date with their annual eye exam?  Yes  Who is the provider or what is the name of the office in which the patient attends annual eye exams? [facility] If pt is not established with a provider, would they like to be referred to a provider to establish care? No .   Dental Screening: Recommended annual dental exams for proper oral hygiene  Community Resource Referral / Chronic Care Management: CRR required this visit?  No   CCM required this visit?  No      Plan:     I have personally reviewed and noted the following in the patient's chart:   Medical and social history Use of alcohol, tobacco or illicit drugs  Current medications and supplements including opioid prescriptions.  Functional ability and status Nutritional status Physical activity Advanced directives List of other physicians Hospitalizations, surgeries, and ER visits in previous 12 months Vitals Screenings to include cognitive, depression, and falls Referrals and appointments  In addition, I have reviewed and discussed with patient certain preventive protocols, quality metrics, and best practice recommendations. A written personalized care plan for preventive services as well as general preventive health recommendations  were provided to patient.     Lesleigh Noe, MD   02/05/2021

## 2021-02-25 ENCOUNTER — Ambulatory Visit: Payer: Medicare HMO | Admitting: Family Medicine

## 2021-02-25 ENCOUNTER — Other Ambulatory Visit: Payer: Self-pay

## 2021-02-25 ENCOUNTER — Ambulatory Visit (INDEPENDENT_AMBULATORY_CARE_PROVIDER_SITE_OTHER): Payer: Medicare HMO | Admitting: Family Medicine

## 2021-02-25 ENCOUNTER — Encounter: Payer: Self-pay | Admitting: Family Medicine

## 2021-02-25 VITALS — BP 130/82 | HR 78 | Temp 96.2°F | Wt 144.1 lb

## 2021-02-25 DIAGNOSIS — J029 Acute pharyngitis, unspecified: Secondary | ICD-10-CM | POA: Diagnosis not present

## 2021-02-25 LAB — POCT RAPID STREP A (OFFICE): Rapid Strep A Screen: NEGATIVE

## 2021-02-25 NOTE — Progress Notes (Signed)
Subjective:     Rhonda Reeves is a 65 y.o. female presenting for Sore Throat (X 1 week and has not gotten worse in that time ) and Headache     Sore Throat  This is a new problem. Episode onset: 02/20/2021. The problem has been waxing and waning (worse in the morning). There has been no fever. Associated symptoms include headaches. Pertinent negatives include no congestion, coughing, diarrhea, ear pain, plugged ear sensation, shortness of breath or vomiting. She has had no exposure to strep or mono. She has tried acetaminophen and NSAIDs (hot tea) for the symptoms. The treatment provided no relief.  Headache  Associated symptoms include a sore throat and tinnitus. Pertinent negatives include no coughing, ear pain, fever, nausea, rhinorrhea or vomiting.    Review of Systems  Constitutional:  Negative for chills, fatigue and fever.  HENT:  Positive for sore throat and tinnitus. Negative for congestion, ear pain, rhinorrhea and sinus pain.   Eyes:  Negative for itching.  Respiratory:  Negative for cough and shortness of breath.   Gastrointestinal:  Negative for diarrhea, nausea and vomiting.  Neurological:  Positive for headaches.    Social History   Tobacco Use  Smoking Status Former   Packs/day: 1.00   Years: 35.00   Pack years: 35.00   Types: Cigarettes   Quit date: 03/03/2005   Years since quitting: 15.9  Smokeless Tobacco Never        Objective:    BP Readings from Last 3 Encounters:  02/25/21 130/82  02/05/21 112/78  01/22/21 (!) 143/86   Wt Readings from Last 3 Encounters:  02/25/21 144 lb 2 oz (65.4 kg)  02/05/21 141 lb 6 oz (64.1 kg)  01/22/21 138 lb 9.6 oz (62.9 kg)    BP 130/82   Pulse 78   Temp (!) 96.2 F (35.7 C) (Temporal)   Wt 144 lb 2 oz (65.4 kg)   SpO2 98%   BMI 26.15 kg/m    Physical Exam Constitutional:      General: She is not in acute distress.    Appearance: She is well-developed. She is not diaphoretic.  HENT:     Head:  Normocephalic and atraumatic.     Right Ear: Tympanic membrane and ear canal normal.     Left Ear: Tympanic membrane and ear canal normal.     Nose: Mucosal edema and rhinorrhea present.     Right Sinus: No maxillary sinus tenderness or frontal sinus tenderness.     Left Sinus: No maxillary sinus tenderness or frontal sinus tenderness.     Mouth/Throat:     Pharynx: Uvula midline. Posterior oropharyngeal erythema present. No pharyngeal swelling or oropharyngeal exudate.     Tonsils: 0 on the right. 0 on the left.  Eyes:     General: No scleral icterus.    Conjunctiva/sclera: Conjunctivae normal.  Cardiovascular:     Rate and Rhythm: Normal rate and regular rhythm.     Heart sounds: Normal heart sounds. No murmur heard. Pulmonary:     Effort: Pulmonary effort is normal. No respiratory distress.     Breath sounds: Normal breath sounds.  Musculoskeletal:     Cervical back: Neck supple.  Lymphadenopathy:     Cervical: No cervical adenopathy.  Skin:    General: Skin is warm and dry.     Capillary Refill: Capillary refill takes less than 2 seconds.  Neurological:     Mental Status: She is alert.    Strept negative  Assessment & Plan:   Problem List Items Addressed This Visit   None Visit Diagnoses     Sore throat    -  Primary   Relevant Orders   Rapid Strep A (Completed)      Rapid strept negative Covid neg at home Discussed likely viral and symptomatic care  Return if symptoms worsen or fail to improve.  Lesleigh Noe, MD  This visit occurred during the SARS-CoV-2 public health emergency.  Safety protocols were in place, including screening questions prior to the visit, additional usage of staff PPE, and extensive cleaning of exam room while observing appropriate contact time as indicated for disinfecting solutions.

## 2021-02-25 NOTE — Patient Instructions (Signed)
Based on your symptoms, it looks like you have a virus.   Antibiotics are not need for a viral infection but the following will help:   Drink plenty of fluids Get lots of rest  Sinus Congestion 1) Neti Pot (Saline rinse) -- 2 times day -- if tolerated 2) Flonase (Store Brand ok) - once daily 3) Over the counter congestion medications  Cough 1) Cough drops can be helpful 2) Nyquil (or nighttime cough medication) 3) Honey is proven to be one of the best cough medications  4) Cough medicine with Dextromethorphan can also be helpful  Sore Throat 1) Honey as above, cough drops 2) Ibuprofen or Aleve can be helpful 3) Salt water Gargles  If you develop fevers (Temperature >100.4), chills, worsening symptoms or symptoms lasting longer than 10 days return to clinic.    

## 2021-05-12 ENCOUNTER — Emergency Department: Payer: Medicare HMO

## 2021-05-12 ENCOUNTER — Other Ambulatory Visit: Payer: Self-pay

## 2021-05-12 ENCOUNTER — Emergency Department
Admission: EM | Admit: 2021-05-12 | Discharge: 2021-05-12 | Disposition: A | Discharge status: Home / Self Care | Payer: Medicare HMO | Attending: Emergency Medicine | Admitting: Emergency Medicine

## 2021-05-12 ENCOUNTER — Encounter: Payer: Self-pay | Admitting: Emergency Medicine

## 2021-05-12 DIAGNOSIS — Y92410 Unspecified street and highway as the place of occurrence of the external cause: Secondary | ICD-10-CM | POA: Diagnosis not present

## 2021-05-12 DIAGNOSIS — S82402A Unspecified fracture of shaft of left fibula, initial encounter for closed fracture: Secondary | ICD-10-CM | POA: Diagnosis not present

## 2021-05-12 DIAGNOSIS — I700 Atherosclerosis of aorta: Secondary | ICD-10-CM | POA: Diagnosis not present

## 2021-05-12 DIAGNOSIS — R079 Chest pain, unspecified: Secondary | ICD-10-CM | POA: Diagnosis not present

## 2021-05-12 DIAGNOSIS — S82832A Other fracture of upper and lower end of left fibula, initial encounter for closed fracture: Secondary | ICD-10-CM | POA: Insufficient documentation

## 2021-05-12 DIAGNOSIS — S2220XA Unspecified fracture of sternum, initial encounter for closed fracture: Secondary | ICD-10-CM | POA: Diagnosis not present

## 2021-05-12 DIAGNOSIS — R9431 Abnormal electrocardiogram [ECG] [EKG]: Secondary | ICD-10-CM | POA: Diagnosis not present

## 2021-05-12 DIAGNOSIS — S299XXA Unspecified injury of thorax, initial encounter: Secondary | ICD-10-CM | POA: Diagnosis present

## 2021-05-12 DIAGNOSIS — J439 Emphysema, unspecified: Secondary | ICD-10-CM | POA: Diagnosis not present

## 2021-05-12 LAB — CBC WITH DIFFERENTIAL/PLATELET
Abs Immature Granulocytes: 0.03 10*3/uL (ref 0.00–0.07)
Basophils Absolute: 0 10*3/uL (ref 0.0–0.1)
Basophils Relative: 1 %
Eosinophils Absolute: 0.1 10*3/uL (ref 0.0–0.5)
Eosinophils Relative: 1 %
HCT: 44.4 % (ref 36.0–46.0)
Hemoglobin: 14.8 g/dL (ref 12.0–15.0)
Immature Granulocytes: 0 %
Lymphocytes Relative: 24 %
Lymphs Abs: 1.9 10*3/uL (ref 0.7–4.0)
MCH: 30.4 pg (ref 26.0–34.0)
MCHC: 33.3 g/dL (ref 30.0–36.0)
MCV: 91.2 fL (ref 80.0–100.0)
Monocytes Absolute: 0.6 10*3/uL (ref 0.1–1.0)
Monocytes Relative: 7 %
Neutro Abs: 5.4 10*3/uL (ref 1.7–7.7)
Neutrophils Relative %: 67 %
Platelets: 295 10*3/uL (ref 150–400)
RBC: 4.87 MIL/uL (ref 3.87–5.11)
RDW: 11.9 % (ref 11.5–15.5)
WBC: 8.1 10*3/uL (ref 4.0–10.5)
nRBC: 0 % (ref 0.0–0.2)

## 2021-05-12 LAB — COMPREHENSIVE METABOLIC PANEL
ALT: 25 U/L (ref 0–44)
AST: 31 U/L (ref 15–41)
Albumin: 4.5 g/dL (ref 3.5–5.0)
Alkaline Phosphatase: 67 U/L (ref 38–126)
Anion gap: 8 (ref 5–15)
BUN: 15 mg/dL (ref 8–23)
CO2: 28 mmol/L (ref 22–32)
Calcium: 9.5 mg/dL (ref 8.9–10.3)
Chloride: 104 mmol/L (ref 98–111)
Creatinine, Ser: 0.81 mg/dL (ref 0.44–1.00)
GFR, Estimated: >60 mL/min (ref >60)
Glucose, Bld: 88 mg/dL (ref 70–99)
Potassium: 4 mmol/L (ref 3.5–5.1)
Sodium: 140 mmol/L (ref 135–145)
Total Bilirubin: 0.6 mg/dL (ref 0.3–1.2)
Total Protein: 7.4 g/dL (ref 6.5–8.1)

## 2021-05-12 LAB — TROPONIN I (HIGH SENSITIVITY): Troponin I (High Sensitivity): <2 ng/L (ref <18)

## 2021-05-12 MED ORDER — IOHEXOL 300 MG/ML  SOLN
100.0000 mL | Freq: Once | INTRAMUSCULAR | Status: AC | PRN
  Administered 2021-05-12: 100 mL via INTRAVENOUS
  Filled 2021-05-12: qty 100

## 2021-05-12 MED ORDER — OXYCODONE-ACETAMINOPHEN 5-325 MG PO TABS
1.0000 | ORAL_TABLET | ORAL | 0 refills | Status: DC | PRN
Start: 2021-05-12 — End: 2021-05-27

## 2021-05-12 MED ORDER — OXYCODONE-ACETAMINOPHEN 5-325 MG PO TABS
1.0000 | ORAL_TABLET | Freq: Once | ORAL | Status: AC
  Administered 2021-05-12: 1 via ORAL
  Filled 2021-05-12: qty 1
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
## 2021-05-12 NOTE — Discharge Instructions (Signed)
Follow-up with [facility] clinic orthopedics.  Please call for an appointment Follow-up with Dr. Kalman Jewels, please call for an appointment for whoever is covering Dr. Einar Pheasant [facility] emergency department worsening Take pain medication as prescribed.  Can also take ibuprofen or Aleve

## 2021-05-13 ENCOUNTER — Telehealth: Payer: Self-pay

## 2021-05-13 NOTE — Telephone Encounter (Signed)
Thank you Rena, so glad she will be seen quickly.  Thank you to Eugenia Pancoast, NP as well.
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
## 2021-05-13 NOTE — Telephone Encounter (Signed)
Sending message to Eugenia Pancoast NP for her to review per her request.

## 2021-05-13 NOTE — Telephone Encounter (Signed)
Rhonda Pancoast, FNP to Pete Pelt, LPN   TD    0:27 PM  Hi and thank you for the information!  After reading through patient's chart the ER found incidentally a possible hemangioma versus liver lesion on her CT scan.  Requiring further work-up to include a possible MRI of the abdomen.  This is not necessarily urgent as long as this is what she is following up for.  As far as the fracture she will need to follow-up with the orthopedist so we need to make sure that she is getting an appointment them soon as possible.  Thank you for the recommendations and I believe she is okay to wait until the appointment that is already scheduled on 6 February as the need for an liver work-up and does not seem to be urgent.       I spoke with pt ;pt said [facility] called her and will take information to one of the orthopedist and then pt should get cb this afternoon about how to proceed in re: to fxs. Pt said if she had not heard from [facility] by this afternoon at 4 PM pt is going to call [facility] back. Reviewed UC & ED precautions and if pt needs sooner appt than 05/27/21 pt will call [facility] back. Sending note to Red Christians FNP.

## 2021-05-15 ENCOUNTER — Other Ambulatory Visit: Payer: Self-pay

## 2021-05-15 ENCOUNTER — Ambulatory Visit: Payer: Medicare HMO | Admitting: Internal Medicine

## 2021-05-15 DIAGNOSIS — S82402A Unspecified fracture of shaft of left fibula, initial encounter for closed fracture: Secondary | ICD-10-CM | POA: Diagnosis not present

## 2021-05-27 ENCOUNTER — Ambulatory Visit (INDEPENDENT_AMBULATORY_CARE_PROVIDER_SITE_OTHER): Payer: Medicare HMO | Admitting: Family

## 2021-05-27 ENCOUNTER — Other Ambulatory Visit: Payer: Self-pay

## 2021-05-27 VITALS — BP 122/70 | HR 78 | Temp 97.0°F | Ht 62.25 in | Wt 134.0 lb

## 2021-05-27 DIAGNOSIS — S82832A Other fracture of upper and lower end of left fibula, initial encounter for closed fracture: Secondary | ICD-10-CM | POA: Diagnosis not present

## 2021-05-27 DIAGNOSIS — S2222XA Fracture of body of sternum, initial encounter for closed fracture: Secondary | ICD-10-CM | POA: Diagnosis not present

## 2021-05-27 DIAGNOSIS — K769 Liver disease, unspecified: Secondary | ICD-10-CM | POA: Diagnosis not present

## 2021-05-27 MED ORDER — OXYCODONE-ACETAMINOPHEN 5-325 MG PO TABS: 1.0000 | ORAL_TABLET | ORAL | 0 refills | Status: AC | PRN
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
## 2021-05-27 NOTE — Patient Instructions (Addendum)
A diagnostic test was ordered for today for MRI liver,  and requested to be performed at [facility]  I have sent the order over to the facility.  Please let us know if you have not heard back within 1 week about your referral.    Read handout on sternum fracture.   It was a pleasure seeing you today! Please do not hesitate to reach out with any questions and or concerns.  Regards,   Eugenia Pancoast FNP-C

## 2021-05-28 DIAGNOSIS — K769 Liver disease, unspecified: Secondary | ICD-10-CM | POA: Insufficient documentation

## 2021-05-28 DIAGNOSIS — S2222XA Fracture of body of sternum, initial encounter for closed fracture: Secondary | ICD-10-CM | POA: Insufficient documentation

## 2021-05-28 DIAGNOSIS — S82402A Unspecified fracture of shaft of left fibula, initial encounter for closed fracture: Secondary | ICD-10-CM | POA: Insufficient documentation

## 2021-05-28 DIAGNOSIS — S82832A Other fracture of upper and lower end of left fibula, initial encounter for closed fracture: Secondary | ICD-10-CM

## 2021-05-28 DIAGNOSIS — S2222XD Fracture of body of sternum, subsequent encounter for fracture with routine healing: Secondary | ICD-10-CM | POA: Insufficient documentation

## 2021-05-28 HISTORY — DX: Fracture of body of sternum, subsequent encounter for fracture with routine healing: S22.22XD

## 2021-05-28 HISTORY — DX: Other fracture of upper and lower end of left fibula, initial encounter for closed fracture: S82.832A

## 2021-05-29 DIAGNOSIS — S82402A Unspecified fracture of shaft of left fibula, initial encounter for closed fracture: Secondary | ICD-10-CM | POA: Diagnosis not present

## 2021-05-29 NOTE — Assessment & Plan Note (Signed)
Ordering MRI as suggested from report, pending results.

## 2021-05-29 NOTE — Assessment & Plan Note (Signed)
Pt to f/u with orthopedist as scheduled and continue wearing boot as prescribed.

## 2021-05-29 NOTE — Assessment & Plan Note (Signed)
Have pt f/u in two weeks with PCP, refilled percocet for five more days for acute pain. Hold pillow to chest with movement/laughing/sneezing/coughing etc. Ice to site as needed. If any sudden SOB go to er and or call 911.

## 2021-06-04 ENCOUNTER — Encounter: Payer: Self-pay | Admitting: *Deleted

## 2021-06-04 DIAGNOSIS — H35342 Macular cyst, hole, or pseudohole, left eye: Secondary | ICD-10-CM | POA: Diagnosis not present

## 2021-06-04 DIAGNOSIS — H524 Presbyopia: Secondary | ICD-10-CM | POA: Diagnosis not present

## 2021-06-11 ENCOUNTER — Encounter: Payer: Self-pay | Admitting: Family Medicine

## 2021-06-11 DIAGNOSIS — J439 Emphysema, unspecified: Secondary | ICD-10-CM | POA: Insufficient documentation

## 2021-06-11 DIAGNOSIS — I700 Atherosclerosis of aorta: Secondary | ICD-10-CM | POA: Insufficient documentation

## 2021-06-17 ENCOUNTER — Ambulatory Visit (INDEPENDENT_AMBULATORY_CARE_PROVIDER_SITE_OTHER): Payer: Medicare HMO | Admitting: Family Medicine

## 2021-06-17 ENCOUNTER — Other Ambulatory Visit: Payer: Self-pay

## 2021-06-17 VITALS — BP 118/72 | HR 80 | Temp 98.0°F | Wt 136.0 lb

## 2021-06-17 DIAGNOSIS — I700 Atherosclerosis of aorta: Secondary | ICD-10-CM

## 2021-06-17 DIAGNOSIS — M8580 Other specified disorders of bone density and structure, unspecified site: Secondary | ICD-10-CM

## 2021-06-17 DIAGNOSIS — J439 Emphysema, unspecified: Secondary | ICD-10-CM

## 2021-06-17 DIAGNOSIS — E2839 Other primary ovarian failure: Secondary | ICD-10-CM

## 2021-06-17 DIAGNOSIS — S2222XA Fracture of body of sternum, initial encounter for closed fracture: Secondary | ICD-10-CM | POA: Diagnosis not present

## 2021-06-17 MED ORDER — ATORVASTATIN CALCIUM 10 MG PO TABS: 10.0000 mg | ORAL_TABLET | Freq: Every day | ORAL | 3 refills | Status: DC

## 2021-06-17 MED ORDER — ASPIRIN 81 MG PO TBEC: 81.0000 mg | DELAYED_RELEASE_TABLET | Freq: Every day | ORAL | 3 refills | Status: AC
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
## 2021-06-17 NOTE — Assessment & Plan Note (Signed)
Incidental finding on recent CT. Discussed recommendation for statin and asa. Start atorvastatin 10 mg and ASA 81 mg
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
## 2021-06-17 NOTE — Assessment & Plan Note (Signed)
Symptoms are improving with using only NSAIDs and as needed for pain.  Discussed could take 8 to 12 weeks to heal may be up to 16.  Continue to use pillow when coughing or sneezing.  Update if worsening return once improved for repeat imaging.

## 2021-06-17 NOTE — Assessment & Plan Note (Signed)
Noted on CT scan not currently smoking.  No symptoms of COPD currently discussed symptoms to watch for and to follow-up as needed.

## 2021-06-18 ENCOUNTER — Ambulatory Visit
Admission: RE | Admit: 2021-06-18 | Discharge: 2021-06-18 | Disposition: A | Discharge status: Home / Self Care | Payer: Medicare HMO | Source: Ambulatory Visit | Attending: Family | Admitting: Family

## 2021-06-18 DIAGNOSIS — K769 Liver disease, unspecified: Secondary | ICD-10-CM | POA: Diagnosis not present

## 2021-06-18 DIAGNOSIS — K5730 Diverticulosis of large intestine without perforation or abscess without bleeding: Secondary | ICD-10-CM | POA: Diagnosis not present

## 2021-06-18 DIAGNOSIS — Z9049 Acquired absence of other specified parts of digestive tract: Secondary | ICD-10-CM | POA: Diagnosis not present

## 2021-06-18 DIAGNOSIS — K7689 Other specified diseases of liver: Secondary | ICD-10-CM | POA: Diagnosis not present

## 2021-06-18 MED ORDER — GADOBUTROL 1 MMOL/ML IV SOLN
6.0000 mL | Freq: Once | INTRAVENOUS | Status: AC | PRN
  Administered 2021-06-18: 6 mL via INTRAVENOUS

## 2021-06-19 DIAGNOSIS — S82425A Nondisplaced transverse fracture of shaft of left fibula, initial encounter for closed fracture: Secondary | ICD-10-CM | POA: Diagnosis not present

## 2021-07-03 ENCOUNTER — Other Ambulatory Visit: Payer: Self-pay | Admitting: Family Medicine

## 2021-07-03 DIAGNOSIS — F3341 Major depressive disorder, recurrent, in partial remission: Secondary | ICD-10-CM

## 2021-07-03 DIAGNOSIS — F3175 Bipolar disorder, in partial remission, most recent episode depressed: Secondary | ICD-10-CM

## 2021-07-06 ENCOUNTER — Other Ambulatory Visit: Payer: Self-pay | Admitting: Family Medicine

## 2021-07-06 DIAGNOSIS — F3341 Major depressive disorder, recurrent, in partial remission: Secondary | ICD-10-CM

## 2021-07-06 DIAGNOSIS — F3175 Bipolar disorder, in partial remission, most recent episode depressed: Secondary | ICD-10-CM

## 2021-07-08 NOTE — Telephone Encounter (Signed)
Please verify dose.  During her last visit this was increased to 5 mg. ?

## 2021-07-09 NOTE — Telephone Encounter (Signed)
Mychart sent to pt.

## 2021-07-17 DIAGNOSIS — S82425A Nondisplaced transverse fracture of shaft of left fibula, initial encounter for closed fracture: Secondary | ICD-10-CM | POA: Diagnosis not present

## 2021-08-05 ENCOUNTER — Ambulatory Visit: Payer: Medicare HMO | Admitting: Family Medicine

## 2021-08-06 ENCOUNTER — Ambulatory Visit (INDEPENDENT_AMBULATORY_CARE_PROVIDER_SITE_OTHER): Payer: Medicare HMO | Admitting: Family Medicine

## 2021-08-06 VITALS — BP 120/78 | HR 71 | Temp 98.0°F | Ht 62.25 in | Wt 134.2 lb

## 2021-08-06 DIAGNOSIS — Z1231 Encounter for screening mammogram for malignant neoplasm of breast: Secondary | ICD-10-CM | POA: Diagnosis not present

## 2021-08-06 DIAGNOSIS — F3175 Bipolar disorder, in partial remission, most recent episode depressed: Secondary | ICD-10-CM

## 2021-08-06 DIAGNOSIS — R69 Illness, unspecified: Secondary | ICD-10-CM | POA: Diagnosis not present

## 2021-08-06 DIAGNOSIS — E782 Mixed hyperlipidemia: Secondary | ICD-10-CM

## 2021-08-06 DIAGNOSIS — S2222XD Fracture of body of sternum, subsequent encounter for fracture with routine healing: Secondary | ICD-10-CM | POA: Diagnosis not present

## 2021-08-06 NOTE — Assessment & Plan Note (Signed)
Pain has resolved.  Does have some tenderness to palpation but otherwise no pain with resisted upper body strength testing.  Okay to slowly increase upper body weight training and return to regular activities.  Do not anticipate she will need further follow-up for this. ?

## 2021-08-06 NOTE — Patient Instructions (Signed)
Ok to slowly add weights and increase activity ?

## 2021-08-06 NOTE — Assessment & Plan Note (Signed)
Stable on atorvastatin 10 mg.  Repeat labs in October. ?

## 2021-08-06 NOTE — Assessment & Plan Note (Signed)
She is stable on Lamictal 200 mg, olanzapine 2.5 mg, and Prozac 10 mg.  Annual labs ordered for her to get done in October.  Continue medications. ?
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
## 2021-08-26 ENCOUNTER — Telehealth: Payer: Self-pay | Admitting: Family Medicine

## 2021-08-26 NOTE — Telephone Encounter (Signed)
LVM for pt to rtn my call to schedule AWV with [facility]. Please schedule this appt if the pt calls the office. ?

## 2021-08-28 DIAGNOSIS — S82425A Nondisplaced transverse fracture of shaft of left fibula, initial encounter for closed fracture: Secondary | ICD-10-CM | POA: Diagnosis not present

## 2021-09-12 DIAGNOSIS — H16223 Keratoconjunctivitis sicca, not specified as Sjogren's, bilateral: Secondary | ICD-10-CM | POA: Diagnosis not present

## 2021-09-21 DIAGNOSIS — Z0100 Encounter for examination of eyes and vision without abnormal findings: Secondary | ICD-10-CM | POA: Diagnosis not present

## 2021-11-01 ENCOUNTER — Encounter: Payer: Self-pay | Admitting: Family

## 2021-11-01 ENCOUNTER — Other Ambulatory Visit: Payer: Self-pay | Admitting: Family Medicine

## 2021-11-01 ENCOUNTER — Ambulatory Visit (INDEPENDENT_AMBULATORY_CARE_PROVIDER_SITE_OTHER): Payer: Medicare HMO | Admitting: Family

## 2021-11-01 ENCOUNTER — Ambulatory Visit (INDEPENDENT_AMBULATORY_CARE_PROVIDER_SITE_OTHER)
Admission: RE | Admit: 2021-11-01 | Discharge: 2021-11-01 | Disposition: A | Discharge status: Home / Self Care | Payer: Medicare HMO | Source: Ambulatory Visit | Attending: Family | Admitting: Family

## 2021-11-01 VITALS — BP 116/66 | HR 55 | Temp 98.7°F | Resp 16 | Ht 62.25 in | Wt 129.4 lb

## 2021-11-01 DIAGNOSIS — M79672 Pain in left foot: Secondary | ICD-10-CM | POA: Diagnosis not present

## 2021-11-01 DIAGNOSIS — Z1231 Encounter for screening mammogram for malignant neoplasm of breast: Secondary | ICD-10-CM

## 2021-11-01 MED ORDER — NAPROXEN 500 MG PO TABS: 500.0000 mg | ORAL_TABLET | Freq: Two times a day (BID) | ORAL | 0 refills | Status: AC

## 2021-11-01 NOTE — Progress Notes (Signed)
Established Patient Office Visit  Subjective:  Patient ID: Rhonda Reeves, female    DOB: [birth year]  Age: 66 y.o. MRN: [id number]  CC:  Chief Complaint  Patient presents with   Cyst    On the back left heel does not hurt all the time, but this week it has been hurting.    HPI Rhonda Reeves is here today with concerns.   Knot on the back of her left heel. Has been hurting for about one week, worse with walking but also painful just standing there or sitting. Can cause her to limp.   No known injury.  Has tried ice with slight improvement.  Ibuprofen one night without relief.   Past Medical History:  Diagnosis Date   Depression    Glaucoma    History of kidney stones    H/O   Osteoporosis     Past Surgical History:  Procedure Laterality Date   ABDOMINAL HYSTERECTOMY     has 1 ovary left   BREAST BIOPSY Right 02/24/2018   affirm bx, coil clip, radial scar   BREAST EXCISIONAL BIOPSY Right 03/09/2018   Fibercystic change FOCAL USUAL DUCT HYPERPLASIA NEG MARGINS   BREAST LUMPECTOMY Right 03/09/2018   radial scar   BREAST LUMPECTOMY WITH NEEDLE LOCALIZATION Right 03/09/2018   Procedure: BREAST LUMPECTOMY WITH NEEDLE LOCALIZATION;  Surgeon: Fredirick Maudlin, MD;  Location: [facility];  Service: General;  Laterality: Right;   CHOLECYSTECTOMY N/A 11/21/2017   Procedure: LAPAROSCOPIC CHOLECYSTECTOMY WITH INTRAOPERATIVE CHOLANGIOGRAM;  Surgeon: Florene Glen, MD;  Location: [facility];  Service: General;  Laterality: N/A;   ENDOSCOPIC RETROGRADE CHOLANGIOPANCREATOGRAPHY (ERCP) WITH PROPOFOL N/A 11/20/2017   Procedure: ENDOSCOPIC RETROGRADE CHOLANGIOPANCREATOGRAPHY (ERCP) WITH PROPOFOL;  Surgeon: Lucilla Lame, MD;  Location: [facility];  Service: Endoscopy;  Laterality: N/A;   HEMORRHOID SURGERY      Family History  Problem Relation Age of Onset   Osteoporosis Mother    Graves' disease Mother    Osteoporosis Father    Hernia Father    Heart disease Father    Kidney  Stones Father    Hypotension Father    Osteoporosis Paternal Grandmother    Healthy Brother    Breast cancer Neg Hx     Social History   Socioeconomic History   Marital status: Married    Spouse name: Fletcher Anon   Number of children: 0   Years of education: bachelors degree   Highest education level: Not on file  Occupational History   Not on file  Tobacco Use   Smoking status: Former    Packs/day: 1.00    Years: 35.00    Total pack years: 35.00    Types: Cigarettes    Quit date: 03/03/2005    Years since quitting: 16.6   Smokeless tobacco: Never  Vaping Use   Vaping Use: Never used  Substance and Sexual Activity   Alcohol use: Yes    Alcohol/week: 1.0 standard drink of alcohol    Types: 1 Cans of beer per week    Comment: 1 BEER EVERDAY   Drug use: No   Sexual activity: Yes    Birth control/protection: Surgical  Other Topics Concern   Not on file  Social History Narrative   08/01/19   From[place] she came [place] here   Living: with husband Saralyn Pilar (2006) - but also high school sweethearts   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      Family: mother in [place] and brother in  [place] - good relationship      Enjoys: hike, gardening, watching movies      Exercise: walking 5 nights a week, stretches and weights in the morning   Diet: oatmeal breakfast, yogurt/banana/apple lunch, brown rice + veggies, veggies w/ pasta, pizza - chicken/salmon       Safety   Seat belts: Yes    Guns: no   Safe in relationships: Yes    Social Determinants of Radio broadcast assistant Strain: Not on file  Food Insecurity: Not on file  Transportation Needs: Not on file  Physical Activity: Not on file  Stress: Not on file  Social Connections: Not on file  Intimate Partner Violence: Not on file    Outpatient Medications Prior to Visit  Medication Sig Dispense Refill   aspirin 81 MG EC tablet Take 1 tablet (81 mg total) by mouth daily. Swallow whole. 90 tablet 3    atorvastatin (LIPITOR) 10 MG tablet Take 1 tablet (10 mg total) by mouth daily. 90 tablet 3   Calcium Carb-Cholecalciferol (CALCIUM 600 + D PO) Take 1 tablet by mouth daily.     FLUoxetine (PROZAC) 10 MG capsule TAKE ONE CAPSULE BY MOUTH ONE TIME DAILY 90 capsule 2   lamoTRIgine (LAMICTAL) 200 MG tablet TAKE ONE TABLET BY MOUTH ONE TIME DAILY 90 tablet 2   Multiple Vitamins-Minerals (MULTIVITAMIN WITH MINERALS) tablet Take 1 tablet by mouth daily.     OLANZapine (ZYPREXA) 2.5 MG tablet TAKE ONE TABLET BY MOUTH DAILY IN THE MORNING 90 tablet 3   No facility-administered medications prior to visit.    Allergies  Allergen Reactions   Sulfa Antibiotics Other (See Comments)    Unknown        Objective:    Physical Exam Constitutional:      General: She is not in acute distress.    Appearance: Normal appearance. She is normal weight. She is not ill-appearing, toxic-appearing or diaphoretic.  Musculoskeletal:     Left foot: Decreased range of motion (slightly painful ROM with flexion of left foot).  Feet:     Comments: Tender nodule/bump on posterior left lower heel.  Neurological:     Mental Status: She is alert.     BP 116/66   Pulse (!) 55   Temp 98.7 F (37.1 C)   Resp 16   Ht 5' 2.25" (1.581 m)   Wt 129 lb 6 oz (58.7 kg)   SpO2 98%   BMI 23.47 kg/m  Wt Readings from Last 3 Encounters:  11/01/21 129 lb 6 oz (58.7 kg)  08/06/21 134 lb 4 oz (60.9 kg)  06/17/21 136 lb (61.7 kg)     Health Maintenance Due  Topic Date Due   Pneumonia Vaccine 27+ Years old (2 - PPSV23 or PCV20) 03/06/2020   COVID-19 Vaccine (5 - Pfizer series) 06/08/2021   Zoster Vaccines- Shingrix (2 of 2) 09/28/2021    There are no preventive care reminders to display for this patient.  Lab Results  Component Value Date   TSH 1.840 11/30/2018   Lab Results  Component Value Date   WBC 8.1 05/12/2021   HGB 14.8 05/12/2021   HCT 44.4 05/12/2021   MCV 91.2 05/12/2021   PLT 295 05/12/2021    Lab Results  Component Value Date   NA 140 05/12/2021   K 4.0 05/12/2021   CO2 28 05/12/2021   GLUCOSE 88 05/12/2021   BUN 15 05/12/2021   CREATININE 0.81 05/12/2021   BILITOT 0.6 05/12/2021  ALKPHOS 67 05/12/2021   AST 31 05/12/2021   ALT 25 05/12/2021   PROT 7.4 05/12/2021   ALBUMIN 4.5 05/12/2021   CALCIUM 9.5 05/12/2021   ANIONGAP 8 05/12/2021   GFR 65.28 01/10/2020   No results found for: "HGBA1C"    Assessment & Plan:   Problem List Items Addressed This Visit       Other   Intractable left heel pain - Primary    Xray left foot to r/o  Possible spur?  Ice to site Wear supportive shoes Heel exercises as tolerated Referral podiatry as may need injection steroid rx naproxen prn      Relevant Medications   naproxen (NAPROSYN) 500 MG tablet   Other Relevant Orders   DG Foot Complete Left   Ambulatory referral to Podiatry    Meds ordered this encounter  Medications   naproxen (NAPROSYN) 500 MG tablet    Sig: Take 1 tablet (500 mg total) by mouth 2 (two) times daily with a meal for 20 days.    Dispense:  40 tablet    Refill:  0    Order Specific Question:   Supervising Provider    Answer:   BEDSOLE, AMY E [[id number]]    Follow-up: No follow-ups on file.    Eugenia Pancoast, FNP

## 2021-11-01 NOTE — Patient Instructions (Signed)
A referral was placed today for podiatry for heel/posterior heel pain.  Please let us know if you have not heard back within 2 weeks about the referral.  Complete xray(s) prior to leaving today. I will notify you of your results once received. NSAIDS /ibuprofen naproxen prn pain.   Due to recent changes in healthcare laws, you may see results of your imaging and/or laboratory studies on MyChart before I have had a chance to review them.  I understand that in some cases there may be results that are confusing or concerning to you. Please understand that not all results are received at the same time and often I may need to interpret multiple results in order to provide you with the best plan of care or course of treatment. Therefore, I ask that you please give me 2 business days to thoroughly review all your results before contacting my office for clarification. Should we see a critical lab result, you will be contacted sooner.   It was a pleasure seeing you today! Please do not hesitate to reach out with any questions and or concerns.  Regards,   Eugenia Pancoast FNP-C

## 2021-11-01 NOTE — Assessment & Plan Note (Signed)
Xray left foot to r/o  Possible spur?  Ice to site Wear supportive shoes Heel exercises as tolerated Referral podiatry as may need injection steroid rx naproxen prn

## 2021-11-04 ENCOUNTER — Telehealth: Payer: Self-pay | Admitting: Family Medicine

## 2021-11-04 NOTE — Progress Notes (Signed)
As suspected xray did show small superior and plantar spurs. Pt to see podiatry as suggested. Referral already placed.

## 2021-11-04 NOTE — Telephone Encounter (Signed)
Patient called and was returning a call. Call back number [phone].

## 2021-11-04 NOTE — Telephone Encounter (Signed)
Spoke to pt and gave results.

## 2021-11-05 ENCOUNTER — Ambulatory Visit: Payer: Medicare HMO

## 2021-11-05 ENCOUNTER — Encounter: Payer: Self-pay | Admitting: Podiatry

## 2021-11-05 ENCOUNTER — Ambulatory Visit: Payer: Medicare HMO | Admitting: Podiatry

## 2021-11-05 DIAGNOSIS — M7662 Achilles tendinitis, left leg: Secondary | ICD-10-CM

## 2021-11-05 DIAGNOSIS — M722 Plantar fascial fibromatosis: Secondary | ICD-10-CM

## 2021-11-08 ENCOUNTER — Ambulatory Visit: Payer: Medicare HMO | Admitting: Family Medicine

## 2021-11-12 NOTE — Progress Notes (Signed)
Subjective:  Patient ID: Rhonda Reeves, female    DOB: [birth year],  MRN: [id number]  Chief Complaint  Patient presents with   Foot Pain    "I have heel spurs on my left foot."    66 y.o. female presents with the above complaint.  Patient presents with left Achilles tendon insertional pain.  Patient states pain for touch is progressive gotten worse.  She states that she had it in the past and she has heel spurs on her foot.  She denies seeing anyone else prior to seeing me for this.  She wanted to discuss treatment options for it.  She already has a boot at home she has not started wearing it.  Pain scale 7 out of 10 hurts with ambulation hurts with walking   Review of Systems: Negative except as noted in the HPI. Denies N/V/F/Ch.  Past Medical History:  Diagnosis Date   Depression    Glaucoma    History of kidney stones    H/O   Osteoporosis     Current Outpatient Medications:    aspirin 81 MG EC tablet, Take 1 tablet (81 mg total) by mouth daily. Swallow whole., Disp: 90 tablet, Rfl: 3   atorvastatin (LIPITOR) 10 MG tablet, Take 1 tablet (10 mg total) by mouth daily., Disp: 90 tablet, Rfl: 3   Calcium Carb-Cholecalciferol (CALCIUM 600 + D PO), Take 1 tablet by mouth daily., Disp: , Rfl:    FLUoxetine (PROZAC) 10 MG capsule, TAKE ONE CAPSULE BY MOUTH ONE TIME DAILY, Disp: 90 capsule, Rfl: 2   lamoTRIgine (LAMICTAL) 200 MG tablet, TAKE ONE TABLET BY MOUTH ONE TIME DAILY, Disp: 90 tablet, Rfl: 2   Multiple Vitamins-Minerals (MULTIVITAMIN WITH MINERALS) tablet, Take 1 tablet by mouth daily., Disp: , Rfl:    naproxen (NAPROSYN) 500 MG tablet, Take 1 tablet (500 mg total) by mouth 2 (two) times daily with a meal for 20 days., Disp: 40 tablet, Rfl: 0   OLANZapine (ZYPREXA) 2.5 MG tablet, TAKE ONE TABLET BY MOUTH DAILY IN THE MORNING, Disp: 90 tablet, Rfl: 3  Social History   Tobacco Use  Smoking Status Former   Packs/day: 1.00   Years: 35.00   Total pack years: 35.00   Types:  Cigarettes   Quit date: 03/03/2005   Years since quitting: 16.7  Smokeless Tobacco Never    Allergies  Allergen Reactions   Sulfa Antibiotics Other (See Comments)    Unknown   Objective:  There were no vitals filed for this visit. There is no height or weight on file to calculate BMI. Constitutional Well developed. Well nourished.  Vascular Dorsalis pedis pulses palpable bilaterally. Posterior tibial pulses palpable bilaterally. Capillary refill normal to all digits.  No cyanosis or clubbing noted. Pedal hair growth normal.  Neurologic Normal speech. Oriented to person, place, and time. Epicritic sensation to light touch grossly present bilaterally.  Dermatologic Nails well groomed and normal in appearance. No open wounds. No skin lesions.  Orthopedic: Pain on palpation to the insertion of the Achilles tendon.  Haglund's deformity noted positive Silfverskiold test with gastrocnemius equinus.  No pain at the ATFL ligament peroneal tendon posterior tibial tendon   Radiographs: Heel spur noted to the posterior aspect.  Intact Kager's fat triangle noted plantar heel spurring noted no other bony abnormalities identified Assessment:   1. Achilles tendinitis, left leg    Plan:  Patient was evaluated and treated and all questions answered.  Left Achilles tendinitis -All questions and concerns were discussed with  the patient in extensive detail.  Given the amount of pain that she is experienced she will benefit from a cam boot immobilization.  She already has cam boot at home advised her to place herself on the left side for next 4 weeks.  If there is still some residual pain we will discuss MRI versus steroid injection at her next medical visit.  She states understanding.  No follow-ups on file.

## 2021-11-29 ENCOUNTER — Encounter (INDEPENDENT_AMBULATORY_CARE_PROVIDER_SITE_OTHER): Payer: Medicare HMO | Admitting: Ophthalmology

## 2021-12-02 ENCOUNTER — Encounter (INDEPENDENT_AMBULATORY_CARE_PROVIDER_SITE_OTHER): Payer: Medicare HMO | Admitting: Ophthalmology

## 2021-12-02 DIAGNOSIS — H35342 Macular cyst, hole, or pseudohole, left eye: Secondary | ICD-10-CM | POA: Diagnosis not present

## 2021-12-02 DIAGNOSIS — H35373 Puckering of macula, bilateral: Secondary | ICD-10-CM | POA: Diagnosis not present

## 2021-12-02 DIAGNOSIS — H43813 Vitreous degeneration, bilateral: Secondary | ICD-10-CM

## 2021-12-03 ENCOUNTER — Ambulatory Visit: Payer: Medicare HMO | Admitting: Podiatry

## 2021-12-03 DIAGNOSIS — M7662 Achilles tendinitis, left leg: Secondary | ICD-10-CM

## 2021-12-03 NOTE — Progress Notes (Signed)
Subjective:  Patient ID: Rhonda Reeves, female    DOB: 02/19/1956,  MRN: [id number]  Chief Complaint  Patient presents with   Foot Pain    Pt stated that the pain is some what better still has some pulling sensation     66 y.o. female presents with the above complaint.  Patient presents with complaint of left Achilles tendinitis.  She states she is doing a lot better 100% resolved she denies any other acute complaints.  She wore the boot for 3 and half weeks she feels much better now.  She would like to discuss next treatment plan.  I encouraged stretching exercises well   Review of Systems: Negative except as noted in the HPI. Denies N/V/F/Ch.  Past Medical History:  Diagnosis Date   Depression    Glaucoma    History of kidney stones    H/O   Osteoporosis     Current Outpatient Medications:    aspirin 81 MG EC tablet, Take 1 tablet (81 mg total) by mouth daily. Swallow whole., Disp: 90 tablet, Rfl: 3   atorvastatin (LIPITOR) 10 MG tablet, Take 1 tablet (10 mg total) by mouth daily., Disp: 90 tablet, Rfl: 3   Calcium Carb-Cholecalciferol (CALCIUM 600 + D PO), Take 1 tablet by mouth daily., Disp: , Rfl:    FLUoxetine (PROZAC) 10 MG capsule, TAKE ONE CAPSULE BY MOUTH ONE TIME DAILY, Disp: 90 capsule, Rfl: 2   lamoTRIgine (LAMICTAL) 200 MG tablet, TAKE ONE TABLET BY MOUTH ONE TIME DAILY, Disp: 90 tablet, Rfl: 2   Multiple Vitamins-Minerals (MULTIVITAMIN WITH MINERALS) tablet, Take 1 tablet by mouth daily., Disp: , Rfl:    OLANZapine (ZYPREXA) 2.5 MG tablet, TAKE ONE TABLET BY MOUTH DAILY IN THE MORNING, Disp: 90 tablet, Rfl: 3  Social History   Tobacco Use  Smoking Status Former   Packs/day: 1.00   Years: 35.00   Total pack years: 35.00   Types: Cigarettes   Quit date: 03/03/2005   Years since quitting: 16.7  Smokeless Tobacco Never    Allergies  Allergen Reactions   Sulfa Antibiotics Other (See Comments)    Unknown   Objective:  There were no vitals filed for this  visit. There is no height or weight on file to calculate BMI. Constitutional Well developed. Well nourished.  Vascular Dorsalis pedis pulses palpable bilaterally. Posterior tibial pulses palpable bilaterally. Capillary refill normal to all digits.  No cyanosis or clubbing noted. Pedal hair growth normal.  Neurologic Normal speech. Oriented to person, place, and time. Epicritic sensation to light touch grossly present bilaterally.  Dermatologic Nails well groomed and normal in appearance. No open wounds. No skin lesions.  Orthopedic: No further pain on palpation to the insertion of the Achilles tendon.  Haglund's deformity noted positive Silfverskiold test with gastrocnemius equinus.  No pain at the ATFL ligament peroneal tendon posterior tibial tendon   Radiographs: Heel spur noted to the posterior aspect.  Intact Kager's fat triangle noted plantar heel spurring noted no other bony abnormalities identified Assessment:   1. Achilles tendinitis, left leg     Plan:  Patient was evaluated and treated and all questions answered.  Left Achilles tendinitis -Clinically her pain resolved completely at this time I discussed shoe gear modification as well as heel lift management.  I discussed this patient in extensive detail she states understanding.  She can be getting transition out of the boot into a shoe with an ankle brace.  Ankle brace was dispensed.  If it regresses  we will further discuss steroid injection versus MRI.  She states understanding -Follow-up  No follow-ups on file.

## 2022-01-20 ENCOUNTER — Ambulatory Visit (INDEPENDENT_AMBULATORY_CARE_PROVIDER_SITE_OTHER): Payer: Medicare HMO | Admitting: Radiology

## 2022-01-20 DIAGNOSIS — F3175 Bipolar disorder, in partial remission, most recent episode depressed: Secondary | ICD-10-CM | POA: Diagnosis not present

## 2022-01-20 DIAGNOSIS — E782 Mixed hyperlipidemia: Secondary | ICD-10-CM | POA: Diagnosis not present

## 2022-01-20 DIAGNOSIS — R69 Illness, unspecified: Secondary | ICD-10-CM | POA: Diagnosis not present

## 2022-01-20 LAB — COMPREHENSIVE METABOLIC PANEL
ALT: 12 U/L (ref 0–35)
AST: 16 U/L (ref 0–37)
Albumin: 4.4 g/dL (ref 3.5–5.2)
Alkaline Phosphatase: 50 U/L (ref 39–117)
BUN: 20 mg/dL (ref 6–23)
CO2: 27 mEq/L (ref 19–32)
Calcium: 9.3 mg/dL (ref 8.4–10.5)
Chloride: 106 mEq/L (ref 96–112)
Creatinine, Ser: 0.98 mg/dL (ref 0.40–1.20)
GFR: 59.79 mL/min — ABNORMAL LOW (ref >60.00)
Glucose, Bld: 88 mg/dL (ref 70–99)
Potassium: 4.4 mEq/L (ref 3.5–5.1)
Sodium: 142 mEq/L (ref 135–145)
Total Bilirubin: 0.4 mg/dL (ref 0.2–1.2)
Total Protein: 6.6 g/dL (ref 6.0–8.3)

## 2022-01-20 LAB — LIPID PANEL
Cholesterol: 164 mg/dL (ref 0–200)
HDL: 80 mg/dL (ref >39.00)
LDL Cholesterol: 71 mg/dL (ref 0–99)
NonHDL: 84.46
Total CHOL/HDL Ratio: 2
Triglycerides: 65 mg/dL (ref 0.0–149.0)
VLDL: 13 mg/dL (ref 0.0–40.0)

## 2022-01-20 LAB — CBC
HCT: 42.7 % (ref 36.0–46.0)
Hemoglobin: 14.3 g/dL (ref 12.0–15.0)
MCHC: 33.5 g/dL (ref 30.0–36.0)
MCV: 91 fl (ref 78.0–100.0)
Platelets: 247 10*3/uL (ref 150.0–400.0)
RBC: 4.69 Mil/uL (ref 3.87–5.11)
RDW: 12.7 % (ref 11.5–15.5)
WBC: 6 10*3/uL (ref 4.0–10.5)

## 2022-01-31 ENCOUNTER — Other Ambulatory Visit: Payer: Medicare HMO

## 2022-02-07 ENCOUNTER — Encounter: Payer: Medicare HMO | Admitting: Family

## 2022-02-07 ENCOUNTER — Encounter: Payer: Medicare HMO | Admitting: Family Medicine

## 2022-02-10 ENCOUNTER — Ambulatory Visit (INDEPENDENT_AMBULATORY_CARE_PROVIDER_SITE_OTHER): Payer: Medicare HMO | Admitting: Family

## 2022-02-10 ENCOUNTER — Encounter: Payer: Self-pay | Admitting: Family

## 2022-02-10 VITALS — BP 118/70 | HR 69 | Temp 98.2°F | Resp 16 | Ht 61.25 in | Wt 127.2 lb

## 2022-02-10 DIAGNOSIS — Z860100 Personal history of colon polyps, unspecified: Secondary | ICD-10-CM | POA: Insufficient documentation

## 2022-02-10 DIAGNOSIS — E782 Mixed hyperlipidemia: Secondary | ICD-10-CM | POA: Diagnosis not present

## 2022-02-10 DIAGNOSIS — G3184 Mild cognitive impairment, so stated: Secondary | ICD-10-CM

## 2022-02-10 DIAGNOSIS — F3175 Bipolar disorder, in partial remission, most recent episode depressed: Secondary | ICD-10-CM

## 2022-02-10 DIAGNOSIS — Z23 Encounter for immunization: Secondary | ICD-10-CM | POA: Diagnosis not present

## 2022-02-10 DIAGNOSIS — D1803 Hemangioma of intra-abdominal structures: Secondary | ICD-10-CM

## 2022-02-10 DIAGNOSIS — M8580 Other specified disorders of bone density and structure, unspecified site: Secondary | ICD-10-CM

## 2022-02-10 DIAGNOSIS — I700 Atherosclerosis of aorta: Secondary | ICD-10-CM

## 2022-02-10 DIAGNOSIS — R69 Illness, unspecified: Secondary | ICD-10-CM | POA: Diagnosis not present

## 2022-02-10 DIAGNOSIS — H9313 Tinnitus, bilateral: Secondary | ICD-10-CM

## 2022-02-10 DIAGNOSIS — Z0000 Encounter for general adult medical examination without abnormal findings: Secondary | ICD-10-CM

## 2022-02-10 NOTE — Assessment & Plan Note (Signed)
Advised to maintain f/u with colonoscopy as indicated.

## 2022-02-10 NOTE — Patient Instructions (Signed)
  I want you to get the pneumonococcal 23 vaccination at the pharmacy.

## 2022-02-10 NOTE — Assessment & Plan Note (Signed)
Due to stability we will continue refills from primary care, did advise pt If any new episode will refer to psychiatry.

## 2022-02-10 NOTE — Progress Notes (Signed)
Annual Wellness Visit     Patient: Rhonda Reeves, Female    DOB: 1956/02/16, 66 y.o.   MRN: [id number]  Subjective  Chief Complaint  Patient presents with  . Medicare Wellness  . Rhonda Reeves is a 66 y.o. female who presents today for her Annual Wellness Visit as well as transfer of care.  Pcp prior was Dr. Waunita Reeves.   She reports consuming a general diet. Home exercise routine includes stretching and walking 2 hrs per day. She generally feels well. She reports sleeping well. She does not have additional problems to discuss today.   HPI  Vision:Within last year and Dental: No current dental problems, Receives regular dental care, and Last dental visit: sept 2023, vision visit was 06/04/21.   Bipolar, manic /depressive episode: has not had an episode in over ten years. Has been on prozac 10 mg and lamictal 200 mg for years. Also taking zyprexa.   Colonscopy: every three years, due in march of 2023.   Hld: taking Lipitor 10 mg. Doing well, walks five times a week as well.  Lab Results  Component Value Date   CHOL 164 01/20/2022   HDL 80.00 01/20/2022   LDLCALC 71 01/20/2022   TRIG 65.0 01/20/2022   CHOLHDL 2 01/20/2022      02/10/2022   11:18 AM 02/10/2022   10:45 AM 02/05/2021   10:01 AM  PHQ9 SCORE ONLY  PHQ-9 Total Score 0 0 3    Liver lesion: last Mri with 11 mm suspected flash hemangioma. Last lft's wnl   Emphysema centrilobular on Ct chest, pt denies doe or sob. Will monitor. Quit smoking in 2006, 35 pack year smoking history.   Patient Active Problem List   Diagnosis Date Noted  . History of colon polyps 02/10/2022  . Tinnitus aurium, bilateral 02/10/2022  . Encounter for subsequent annual wellness visit (AWV) in Medicare patient 02/10/2022  . Hemangioma of intra-abdominal structure 02/10/2022  . Aortic atherosclerosis ([place]) 06/11/2021  . Emphysema lung ([place]) 06/11/2021  . Osteopenia 02/28/2020  . Hyperlipidemia 01/10/2020   . Tinnitus 08/01/2019  . Bipolar disorder ([place]) 08/01/2019  . Recurrent major depressive disorder, in partial remission ([place]) 08/01/2019  . S/P breast lumpectomy 03/22/2018   Past Medical History:  Diagnosis Date  . Closed fracture of upper end of left fibula 05/28/2021  . Depression   . Fracture of body of sternum, subsequent encounter for fracture with routine healing 05/28/2021  . Glaucoma   . History of kidney stones    H/O  . Osteoporosis    Past Surgical History:  Procedure Laterality Date  . ABDOMINAL HYSTERECTOMY     has 1 ovary left  . BREAST BIOPSY Right 02/24/2018   affirm bx, coil clip, radial scar  . BREAST EXCISIONAL BIOPSY Right 03/09/2018   Fibercystic change FOCAL USUAL DUCT HYPERPLASIA NEG MARGINS  . BREAST LUMPECTOMY Right 03/09/2018   radial scar  . BREAST LUMPECTOMY WITH NEEDLE LOCALIZATION Right 03/09/2018   Procedure: BREAST LUMPECTOMY WITH NEEDLE LOCALIZATION;  Surgeon: Fredirick Maudlin, MD;  Location: [facility] ORS;  Service: General;  Laterality: Right;  . CHOLECYSTECTOMY N/A 11/21/2017   Procedure: LAPAROSCOPIC CHOLECYSTECTOMY WITH INTRAOPERATIVE CHOLANGIOGRAM;  Surgeon: Florene Glen, MD;  Location: [facility] ORS;  Service: General;  Laterality: N/A;  . ENDOSCOPIC RETROGRADE CHOLANGIOPANCREATOGRAPHY (ERCP) WITH PROPOFOL N/A 11/20/2017   Procedure: ENDOSCOPIC RETROGRADE CHOLANGIOPANCREATOGRAPHY (ERCP) WITH PROPOFOL;  Surgeon: Lucilla Lame, MD;  Location: [facility] ENDOSCOPY;  Service: Endoscopy;  Laterality:  N/A;  . HEMORRHOID SURGERY     Social History   Tobacco Use  . Smoking status: Former    Packs/day: 1.00    Years: 35.00    Total pack years: 35.00    Types: Cigarettes    Quit date: 03/03/2005    Years since quitting: 16.9  . Smokeless tobacco: Never  Vaping Use  . Vaping Use: Never used  Substance Use Topics  . Alcohol use: Yes    Alcohol/week: 1.0 standard drink of alcohol    Types: 1 Cans of beer per week    Comment: two beers daily  . Drug use: No    Social History   Socioeconomic History  . Marital status: Married    Spouse name: Rhonda Reeves  . Number of children: 0  . Years of education: bachelors degree  . Highest education level: Not on file  Occupational History  . Not on file  Tobacco Use  . Smoking status: Former    Packs/day: 1.00    Years: 35.00    Total pack years: 35.00    Types: Cigarettes    Quit date: 03/03/2005    Years since quitting: 16.9  . Smokeless tobacco: Never  Vaping Use  . Vaping Use: Never used  Substance and Sexual Activity  . Alcohol use: Yes    Alcohol/week: 1.0 standard drink of alcohol    Types: 1 Cans of beer per week    Comment: two beers daily  . Drug use: No  . Sexual activity: Yes    Partners: Male    Birth control/protection: Surgical, Post-menopausal  Other Topics Concern  . 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      Family: mother in [place] and brother in [place] - good relationship      Enjoys: hike, gardening, watching movies      Exercise: walking 5 nights a week, stretches and weights in the morning   Diet: oatmeal breakfast, yogurt/banana/apple lunch, brown rice + veggies, veggies w/ pasta, pizza - chicken/salmon       Safety   Seat belts: Yes    Guns: no   Safe in relationships: Yes    Social Determinants of Radio broadcast assistant Strain: Not on file  Food Insecurity: Not on file  Transportation Needs: Not on file  Physical Activity: Not on file  Stress: Not on file  Social Connections: Not on file  Intimate Partner Violence: Not on file   Family Status  Relation Name Status  . Mother  Alive  . Father  Deceased  . PGM  (Not Specified)  . Brother  Alive  . Neg Hx  (Not Specified)   Family History  Problem Relation Age of Onset  . Osteoporosis Mother   . Graves' disease  Mother   . Osteoporosis Father   . Hernia Father   . Heart disease Father   . Kidney Stones Father   . Hypotension Father   . Osteoporosis Paternal Grandmother   . Healthy Brother   . Breast cancer Neg Hx    Allergies  Allergen Reactions  . Sulfa Antibiotics Nausea Only    Unknown      Medications: Outpatient Medications Prior to Visit  Medication Sig  . aspirin 81 MG EC tablet Take 1 tablet (81 mg total)  by mouth daily. Swallow whole.  Marland Kitchen atorvastatin (LIPITOR) 10 MG tablet Take 1 tablet (10 mg total) by mouth daily.  . Calcium Carb-Cholecalciferol (CALCIUM 600 + D PO) Take 1 tablet by mouth daily.  Marland Kitchen FLUoxetine (PROZAC) 10 MG capsule TAKE ONE CAPSULE BY MOUTH ONE TIME DAILY  . lamoTRIgine (LAMICTAL) 200 MG tablet TAKE ONE TABLET BY MOUTH ONE TIME DAILY  . Multiple Vitamins-Minerals (MULTIVITAMIN WITH MINERALS) tablet Take 1 tablet by mouth daily.  Marland Kitchen OLANZapine (ZYPREXA) 2.5 MG tablet TAKE ONE TABLET BY MOUTH DAILY IN THE MORNING   No facility-administered medications prior to visit.    Allergies  Allergen Reactions  . Sulfa Antibiotics Nausea Only    Unknown    Patient Care Team: Eugenia Pancoast, FNP as PCP - General (Family Medicine) Hayden Pedro, MD as Consulting Physician (Ophthalmology)        Objective  BP 118/70   Pulse 69   Temp 98.2 F (36.8 C)   Resp 16   Ht 5' 1.25" (1.556 m)   Wt 127 lb 4 oz (57.7 kg)   SpO2 97%   BMI 23.85 kg/m  BP Readings from Last 3 Encounters:  02/10/22 118/70  11/01/21 116/66  08/06/21 120/78      Physical Exam Vitals reviewed.  Constitutional:      General: She is not in acute distress.    Appearance: Normal appearance. She is normal weight. She is not ill-appearing, toxic-appearing or diaphoretic.  Cardiovascular:     Rate and Rhythm: Normal rate and regular rhythm.  Pulmonary:     Effort: Pulmonary effort is normal.     Breath sounds: Normal breath sounds.  Abdominal:     General: Abdomen is flat.      Tenderness: There is no abdominal tenderness.  Musculoskeletal:     Right lower leg: No edema.     Left lower leg: No edema.  Neurological:     General: No focal deficit present.     Mental Status: She is alert and oriented to person, place, and time. Mental status is at baseline.  Psychiatric:        Mood and Affect: Mood normal.        Behavior: Behavior normal.        Thought Content: Thought content normal.        Judgment: Judgment normal.      Most recent functional status assessment:     No data to display         Most recent fall risk assessment:    02/10/2022   10:45 AM  [place] in the past year? 0  Number falls in past yr: 0  Injury with Fall? 0  Risk for fall due to : No Fall Risks    Most recent depression screenings:    02/10/2022   11:18 AM 02/10/2022   10:45 AM  PHQ 2/9 Scores  PHQ - 2 Score 0 0  PHQ- 9 Score 0    Most recent cognitive screening:     No data to display         Most recent Audit-C alcohol use screening    01/20/2018    4:13 PM  Alcohol Use Disorder Test (AUDIT)  1. How often do you have a drink containing alcohol? 4  2. How many drinks containing alcohol do you have on a typical day when you are drinking? 0  3. How often do you have six or more drinks on one  occasion? 0  AUDIT-C Score 4   A score of 3 or more in women, and 4 or more in men indicates increased risk for alcohol abuse, EXCEPT if all of the points are from question 1   Vision/Hearing Screen: Vision Screening - Comments:: Last eye check was 12/2021 Dr. Gloriann Loan In [place] and Dr. Rodman Key in [place] Lab Results  Component Value Date   WBC 6.0 01/20/2022   HGB 14.3 01/20/2022   HCT 42.7 01/20/2022   MCV 91.0 01/20/2022   MCH 30.4 05/12/2021   RDW 12.7 01/20/2022   PLT 247.0 18/29/9371   Last metabolic panel Lab Results  Component Value Date   GLUCOSE 88 01/20/2022   NA 142 01/20/2022   K 4.4 01/20/2022   CL 106 01/20/2022    CO2 27 01/20/2022   BUN 20 01/20/2022   CREATININE 0.98 01/20/2022   GFRNONAA >60 05/12/2021   CALCIUM 9.3 01/20/2022   PROT 6.6 01/20/2022   ALBUMIN 4.4 01/20/2022   LABGLOB 2.3 11/30/2018   AGRATIO 2.0 11/30/2018   BILITOT 0.4 01/20/2022   ALKPHOS 50 01/20/2022   AST 16 01/20/2022   ALT 12 01/20/2022   ANIONGAP 8 05/12/2021   Last lipids Lab Results  Component Value Date   CHOL 164 01/20/2022   HDL 80.00 01/20/2022   LDLCALC 71 01/20/2022   TRIG 65.0 01/20/2022   CHOLHDL 2 01/20/2022   Last hemoglobin A1c No results found for: "HGBA1C"    No results found for any visits on 02/10/22.    Assessment & Plan   Annual wellness visit done today including the all of the following: Reviewed patient's Family Medical History Reviewed and updated list of patient's medical providers Assessment of cognitive impairment was done Assessed patient's functional ability Established a written schedule for health screening [place] Completed and Reviewed  Exercise Activities and Dietary recommendations  Goals     . Weight (lb) < 130 lb (59 kg)     Working to continue to lose [employer] History  Administered Date(s) Administered  . Fluad Quad(high Dose 65+) 01/10/2020, 01/04/2021, 12/28/2021  . Influenza Inj Mdck Quad Pf 02/07/2017  . Influenza,inj,Quad PF,6+ Mos 06/26/2009, 01/18/2015, 01/30/2016, 01/10/2019  . PFIZER Comirnaty(Gray Top)Covid-19 Tri-Sucrose Vaccine 01/24/2022  . PFIZER(Purple Top)SARS-COV-2 Vaccination 07/23/2019, 08/17/2019, 02/04/2020  . Pension scheme manager 17yr & up 02/05/2021  . Pneumococcal Conjugate-13 01/10/2020  . Pneumococcal Polysaccharide-23 02/10/2022  . Tdap 07/04/2014  . Zoster Recombinat (Shingrix) 08/03/2021, 10/06/2021  . Zoster, Live 03/29/2015    Health Maintenance  Topic Date Due  . COVID-19 Vaccine (5 - Pfizer series) 05/27/2022  . COLONOSCOPY (Pts 45-437yrInsurance  coverage will need to be confirmed)  07/18/2022  . MAMMOGRAM  02/06/2023  . TETANUS/TDAP  07/03/2024  . Pneumonia Vaccine 6525Years old  Completed  . INFLUENZA VACCINE  Completed  . DEXA SCAN  Completed  . Hepatitis C Screening  Completed  . Zoster Vaccines- Shingrix  Completed  . HPV VACCINES  Aged Out   Has mammogram and bone density scheduled for 02/26/22.   Discussed health benefits of physical activity, and encouraged her to engage in regular exercise appropriate for her age and condition.    Problem List Items Addressed This Visit       Cardiovascular and Mediastinum   Aortic atherosclerosis ([place]   Advised pt to continue to work on low chol diet exercise as tolerated Cont lipitor  Nervous and Auditory   RESOLVED: MCI (mild cognitive impairment) with memory loss     Musculoskeletal and Integument   Osteopenia    Bone density ordered, pending appt as scheduled.        Other   Tinnitus    stable      Bipolar disorder ([place])    Due to stability we will continue refills from primary care, did advise pt If any new episode will refer to psychiatry.        Hyperlipidemia    Reviewed lipid panel. Continue atorvastatin 10 mg once nightly.  Work on low cholesterol diet and exercise as tolerated       History of colon polyps    Advised to maintain f/u with colonoscopy as indicated.      Tinnitus aurium, bilateral   Encounter for subsequent annual wellness visit (AWV) in Medicare patient - Primary    Patient Counseling(The following topics were reviewed):  Preventative care handout given to pt  Health maintenance and immunizations reviewed. Please refer to Health maintenance section. Pt advised on safe sex, wearing seatbelts in car, and proper nutrition Dental health: Discussed importance of regular tooth brushing, flossing, and dental visits.pt utd on dental screenings. Pt utd on vision screenings, and visit with optometry.          Hemangioma of  intra-abdominal structure    Reviewed most recent Ct scan. Will continue to monitor LFT's.      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        Other   Tinnitus    stable      Bipolar disorder ([place])    Due to stability we will continue refills from primary care, did advise pt If any new episode will refer to psychiatry.        Hyperlipidemia    Reviewed lipid panel. Continue atorvastatin 10 mg once nightly.  Work on low cholesterol diet and exercise as tolerated       History of colon polyps    Advised to maintain f/u with colonoscopy as indicated.      Tinnitus aurium, bilateral   Encounter for subsequent annual wellness visit (AWV) in Medicare patient - Primary    Patient Counseling(The following topics were reviewed):  Preventative care handout given to pt  Health maintenance and immunizations reviewed. Please refer to Health maintenance section. Pt advised on safe sex, wearing seatbelts in car, and proper nutrition Dental health: Discussed importance of regular tooth brushing, flossing, and dental visits.pt utd on dental screenings. Pt utd on vision screenings, and visit with optometry.          Hemangioma of intra-abdominal structure    Reviewed most recent Ct scan. Will continue to monitor LFT's.      Other Visit Diagnoses     Need for 23-polyvalent pneumococcal polysaccharide vaccine  Relevant Orders   Pneumococcal polysaccharide vaccine 23-valent greater than or equal to 2yo subcutaneous/IM (Completed)        Return in about 6 months (around 08/12/2022) for f/u cholesterol.     Eugenia Pancoast, FNP

## 2022-02-10 NOTE — Assessment & Plan Note (Signed)
stable °

## 2022-02-10 NOTE — Assessment & Plan Note (Signed)
Reviewed most recent Ct. Will continue to monitor.

## 2022-02-10 NOTE — Assessment & Plan Note (Signed)
Bone density ordered, pending appt as scheduled.

## 2022-02-10 NOTE — Assessment & Plan Note (Signed)
Reviewed lipid panel. Continue atorvastatin 10 mg once nightly.  Work on low cholesterol diet and exercise as tolerated

## 2022-02-10 NOTE — Assessment & Plan Note (Signed)
Reviewed most recent Ct scan. Will continue to monitor LFT's.

## 2022-02-10 NOTE — Assessment & Plan Note (Signed)
Patient Counseling(The following topics were reviewed):  Preventative care handout given to pt  Health maintenance and immunizations reviewed. Please refer to Health maintenance section. Pt advised on safe sex, wearing seatbelts in car, and proper nutrition Dental health: Discussed importance of regular tooth brushing, flossing, and dental visits.pt utd on dental screenings. Pt utd on vision screenings, and visit with optometry.

## 2022-02-10 NOTE — Assessment & Plan Note (Signed)
Advised pt to continue to work on low chol diet exercise as tolerated Cont lipitor

## 2022-02-11 ENCOUNTER — Telehealth: Payer: Self-pay | Admitting: Family

## 2022-02-11 NOTE — Telephone Encounter (Signed)
Left message for patient to schedule Annual Wellness Visit.  Please schedule (telephone/video call) with Nurse Health Advisor Charlott Rakes, RN at [facility]  Please call [phone] ask for [facility]

## 2022-02-14 ENCOUNTER — Telehealth: Payer: Self-pay | Admitting: Family

## 2022-02-14 NOTE — Telephone Encounter (Signed)
Left message for patient to schedule Annual Wellness Visit.  Please schedule with Nurse Health Advisor Denisa O'Brien-Blaney, LPN [facility].  Please call [phone] ask for [facility]

## 2022-02-27 ENCOUNTER — Ambulatory Visit
Admission: RE | Admit: 2022-02-27 | Discharge: 2022-02-27 | Disposition: A | Discharge status: Home / Self Care | Payer: Medicare HMO | Source: Ambulatory Visit | Attending: Family Medicine | Admitting: Family Medicine

## 2022-02-27 DIAGNOSIS — M8580 Other specified disorders of bone density and structure, unspecified site: Secondary | ICD-10-CM

## 2022-02-27 DIAGNOSIS — E2839 Other primary ovarian failure: Secondary | ICD-10-CM | POA: Diagnosis not present

## 2022-02-27 DIAGNOSIS — M810 Age-related osteoporosis without current pathological fracture: Secondary | ICD-10-CM | POA: Diagnosis not present

## 2022-02-27 DIAGNOSIS — Z1231 Encounter for screening mammogram for malignant neoplasm of breast: Secondary | ICD-10-CM | POA: Diagnosis not present

## 2022-02-28 ENCOUNTER — Telehealth: Payer: Self-pay | Admitting: Family

## 2022-02-28 ENCOUNTER — Other Ambulatory Visit: Payer: Self-pay | Admitting: Family

## 2022-02-28 DIAGNOSIS — M816 Localized osteoporosis [Lequesne]: Secondary | ICD-10-CM

## 2022-02-28 MED ORDER — ALENDRONATE SODIUM 70 MG PO TABS
70.0000 mg | ORAL_TABLET | ORAL | 11 refills | Status: DC
Start: 2022-02-28 — End: 2022-03-11

## 2022-02-28 NOTE — Telephone Encounter (Signed)
Called and informed pt of this information. 

## 2022-02-28 NOTE — Telephone Encounter (Signed)
Patient called in and stated that she received Tabitha's message but can't respond in regards to medications. She stated she can be reached at [phone].

## 2022-02-28 NOTE — Telephone Encounter (Signed)
Okay great I have sent over to the pharmacy.  Instructions on Fosamax use and side effects - particularly esophageal adverse events - are carefully reviewed with her. This drug must be taken upon arising for the day on an empty stomach, with a large 6-8 ounce glass of water; she must remain NPO in the upright position for at least 30 minutes afterwards and until after the first food of the day. If esophageal irritation is noted, she will stop the drug and call my office.

## 2022-03-11 ENCOUNTER — Encounter: Payer: Self-pay | Admitting: Family

## 2022-03-11 DIAGNOSIS — M816 Localized osteoporosis [Lequesne]: Secondary | ICD-10-CM

## 2022-03-11 MED ORDER — ALENDRONATE SODIUM 70 MG PO TABS: 70.0000 mg | ORAL_TABLET | ORAL | 3 refills | Status: DC

## 2022-03-27 ENCOUNTER — Other Ambulatory Visit: Payer: Self-pay | Admitting: *Deleted

## 2022-03-27 DIAGNOSIS — F3175 Bipolar disorder, in partial remission, most recent episode depressed: Secondary | ICD-10-CM

## 2022-03-27 DIAGNOSIS — F3341 Major depressive disorder, recurrent, in partial remission: Secondary | ICD-10-CM

## 2022-03-27 MED ORDER — FLUOXETINE HCL 10 MG PO CAPS: 10.0000 mg | ORAL_CAPSULE | Freq: Every day | ORAL | 2 refills | Status: DC

## 2022-04-10 ENCOUNTER — Other Ambulatory Visit: Payer: Self-pay

## 2022-04-10 DIAGNOSIS — F3341 Major depressive disorder, recurrent, in partial remission: Secondary | ICD-10-CM

## 2022-04-10 DIAGNOSIS — F3175 Bipolar disorder, in partial remission, most recent episode depressed: Secondary | ICD-10-CM

## 2022-04-10 NOTE — Telephone Encounter (Signed)
Last visit 02/10/2022 Next 08/15/2022

## 2022-04-11 MED ORDER — LAMOTRIGINE 200 MG PO TABS: 200.0000 mg | ORAL_TABLET | Freq: Every day | ORAL | 0 refills | Status: DC

## 2022-05-19 DIAGNOSIS — H524 Presbyopia: Secondary | ICD-10-CM | POA: Diagnosis not present

## 2022-05-19 DIAGNOSIS — H16223 Keratoconjunctivitis sicca, not specified as Sjogren's, bilateral: Secondary | ICD-10-CM | POA: Diagnosis not present

## 2022-05-19 DIAGNOSIS — H25813 Combined forms of age-related cataract, bilateral: Secondary | ICD-10-CM | POA: Diagnosis not present

## 2022-05-26 DIAGNOSIS — R69 Illness, unspecified: Secondary | ICD-10-CM | POA: Diagnosis not present

## 2022-05-27 ENCOUNTER — Encounter: Payer: Self-pay | Admitting: Family

## 2022-05-27 ENCOUNTER — Ambulatory Visit (INDEPENDENT_AMBULATORY_CARE_PROVIDER_SITE_OTHER): Payer: Medicare HMO | Admitting: Family

## 2022-05-27 ENCOUNTER — Ambulatory Visit (INDEPENDENT_AMBULATORY_CARE_PROVIDER_SITE_OTHER)
Admission: RE | Admit: 2022-05-27 | Discharge: 2022-05-27 | Disposition: A | Discharge status: Home / Self Care | Payer: Medicare HMO | Source: Ambulatory Visit | Attending: Family | Admitting: Family

## 2022-05-27 VITALS — BP 130/72 | HR 76 | Temp 98.4°F | Ht 61.25 in | Wt 126.6 lb

## 2022-05-27 DIAGNOSIS — M5431 Sciatica, right side: Secondary | ICD-10-CM | POA: Diagnosis not present

## 2022-05-27 DIAGNOSIS — M19012 Primary osteoarthritis, left shoulder: Secondary | ICD-10-CM | POA: Diagnosis not present

## 2022-05-27 DIAGNOSIS — M25512 Pain in left shoulder: Secondary | ICD-10-CM | POA: Diagnosis not present

## 2022-05-27 MED ORDER — METHYLPREDNISOLONE 4 MG PO TBPK: ORAL_TABLET | ORAL | 0 refills | Status: DC

## 2022-05-27 MED ORDER — METAXALONE 800 MG PO TABS: 800.0000 mg | ORAL_TABLET | Freq: Every evening | ORAL | 0 refills | Status: DC | PRN

## 2022-05-27 NOTE — Progress Notes (Signed)
Established Patient Office Visit  Subjective:   Patient ID: Rhonda Reeves, female    DOB: [birth year]  Age: 67 y.o. MRN: [id number]  CC:  Chief Complaint  Patient presents with   Shoulder Pain   Back Pain    HPI: Rhonda Reeves is a 67 y.o. female presenting on 05/27/2022 for Shoulder Pain and Back Pain     Shoulder Pain   Back Pain    C/o left shoulder pain, started initially about 04/21/22 but has noticed pain getting increasingly worse and sleeping at night will wake her up with the pain. Weight exercises worsen the pain as well. No known injury to shoulder. Pain is worsened by movement.   Also a few months ago started with right mid back pain, that she describes as a dull ache. She started weight bearing exercises which she thinks aggravated the muscle. Pain in back is constant, no sharp stabbing pain. Back is sometimes worse with movement. Worse with lying on back and doing leg stretches. No radiation of pain.   Denies any urinary symptoms.       ROS: Negative unless specifically indicated above in HPI.   Relevant past medical history reviewed and updated as indicated.   Allergies and medications reviewed and updated.   Current Outpatient Medications:    alendronate (FOSAMAX) 70 MG tablet, Take 1 tablet (70 mg total) by mouth once a week. Take with a full glass of water on an empty stomach., Disp: 12 tablet, Rfl: 3   aspirin 81 MG EC tablet, Take 1 tablet (81 mg total) by mouth daily. Swallow whole., Disp: 90 tablet, Rfl: 3   atorvastatin (LIPITOR) 10 MG tablet, Take 1 tablet (10 mg total) by mouth daily., Disp: 90 tablet, Rfl: 3   Calcium Carb-Cholecalciferol (CALCIUM 600 + D PO), Take 1 tablet by mouth daily., Disp: , Rfl:    COMIRNATY SUSP injection, , Disp: , Rfl:    FLUAD QUADRIVALENT 0.5 ML injection, , Disp: , Rfl:    FLUoxetine (PROZAC) 10 MG capsule, Take 1 capsule (10 mg total) by mouth daily., Disp: 90 capsule, Rfl: 2   lamoTRIgine (LAMICTAL) 200 MG  tablet, Take 1 tablet (200 mg total) by mouth daily., Disp: 90 tablet, Rfl: 0   metaxalone (SKELAXIN) 800 MG tablet, Take 1 tablet (800 mg total) by mouth at bedtime as needed for muscle spasms., Disp: 30 tablet, Rfl: 0   methylPREDNISolone (MEDROL DOSEPAK) 4 MG TBPK tablet, Take per package instructions, Disp: 21 tablet, Rfl: 0   Multiple Vitamins-Minerals (MULTIVITAMIN WITH MINERALS) tablet, Take 1 tablet by mouth daily., Disp: , Rfl:    OLANZapine (ZYPREXA) 2.5 MG tablet, TAKE ONE TABLET BY MOUTH DAILY IN THE MORNING, Disp: 90 tablet, Rfl: 3  Allergies  Allergen Reactions   Sulfa Antibiotics Nausea Only and Other (See Comments)    Unknown    Objective:   BP 130/72   Pulse 76   Temp 98.4 F (36.9 C) (Temporal)   Ht 5' 1.25" (1.556 m)   Wt 126 lb 9.6 oz (57.4 kg)   SpO2 99%   BMI 23.73 kg/m    Physical Exam Constitutional:      General: She is not in acute distress.    Appearance: Normal appearance. She is normal weight. She is not ill-appearing, toxic-appearing or diaphoretic.  HENT:     Head: Normocephalic.  Cardiovascular:     Rate and Rhythm: Normal rate.  Pulmonary:     Effort: Pulmonary effort is normal.  Musculoskeletal:     Left shoulder: Bony tenderness (palpable tenderness left acro joint) present. Decreased range of motion: apley test positive, drop can test positive.     Lumbar back: Spasms (tightness right sciatic notch) and tenderness present. Positive right straight leg raise test.     Comments: Pain with external rotation of left arm   Neurological:     General: No focal deficit present.     Mental Status: She is alert and oriented to person, place, and time. Mental status is at baseline.  Psychiatric:        Mood and Affect: Mood normal.        Behavior: Behavior normal.        Thought Content: Thought content normal.        Judgment: Judgment normal.     Assessment & Plan:  Acute pain of left shoulder Assessment & Plan: Xray left shoulder today   Medrol dose pack rx sent in tylenol prn  Exercises sent to mychart for pt to do  If no improvement will refer to orthopedist   Orders: -     DG Shoulder Left; Future -     methylPREDNISolone; Take per package instructions  Dispense: 21 tablet; Refill: 0  Right sciatic nerve pain Assessment & Plan: Rx medrol dose pack  Muscle relaxer skelaxin at night prn  Heat to site.   Orders: -     Metaxalone; Take 1 tablet (800 mg total) by mouth at bedtime as needed for muscle spasms.  Dispense: 30 tablet; Refill: 0     Follow up plan: Return if symptoms worsen or fail to improve, for f/u as scheduled .  Eugenia Pancoast, FNP

## 2022-05-27 NOTE — Patient Instructions (Signed)
  Complete xray(s) prior to leaving today. I will notify you of your results once received.  Marland Kitchenexercises as discussed. Pending xray If shoulder pain not improving can refer to orthopedist.    Regards,   Eugenia Pancoast FNP-C

## 2022-05-27 NOTE — Assessment & Plan Note (Signed)
Rx medrol dose pack  Muscle relaxer skelaxin at night prn  Heat to site.

## 2022-05-27 NOTE — Assessment & Plan Note (Signed)
Xray left shoulder today  Medrol dose pack rx sent in tylenol prn  Exercises sent to mychart for pt to do  If no improvement will refer to orthopedist

## 2022-05-28 ENCOUNTER — Other Ambulatory Visit: Payer: Self-pay

## 2022-05-28 DIAGNOSIS — I700 Atherosclerosis of aorta: Secondary | ICD-10-CM

## 2022-05-28 MED ORDER — ATORVASTATIN CALCIUM 10 MG PO TABS: 10.0000 mg | ORAL_TABLET | Freq: Every day | ORAL | 1 refills | Status: DC

## 2022-06-03 ENCOUNTER — Other Ambulatory Visit: Payer: Self-pay

## 2022-06-03 DIAGNOSIS — R69 Illness, unspecified: Secondary | ICD-10-CM | POA: Diagnosis not present

## 2022-06-03 MED ORDER — OLANZAPINE 2.5 MG PO TABS: ORAL_TABLET | ORAL | 3 refills | Status: DC

## 2022-06-03 NOTE — Telephone Encounter (Signed)
Refill for OLANZapine (ZYPREXA) 2.5 MG tablet. Last refill- 07/10/21; Last visit- 02/10/22; Next visit- 08/18/22. Ok to refill pended med?

## 2022-06-04 DIAGNOSIS — R69 Illness, unspecified: Secondary | ICD-10-CM | POA: Diagnosis not present

## 2022-07-01 DIAGNOSIS — R69 Illness, unspecified: Secondary | ICD-10-CM | POA: Diagnosis not present

## 2022-07-07 ENCOUNTER — Other Ambulatory Visit: Payer: Self-pay | Admitting: Family

## 2022-07-07 DIAGNOSIS — M5431 Sciatica, right side: Secondary | ICD-10-CM

## 2022-07-09 ENCOUNTER — Other Ambulatory Visit: Payer: Self-pay | Admitting: Nurse Practitioner

## 2022-07-09 DIAGNOSIS — F3175 Bipolar disorder, in partial remission, most recent episode depressed: Secondary | ICD-10-CM

## 2022-07-09 DIAGNOSIS — F3341 Major depressive disorder, recurrent, in partial remission: Secondary | ICD-10-CM

## 2022-07-22 ENCOUNTER — Telehealth: Payer: Self-pay | Admitting: Family

## 2022-07-22 NOTE — Telephone Encounter (Signed)
Spoke with the patient and advised that Tabitha orders labs during the appt, so just in case there are other labs that needs to be added on, she can address them at one time. Patient was not happy that she could not have her labs done prior and discussed during her visit. I explain that this was a preference from [place] and apologized. Lab appt for 08/11/22 has been cancelled.

## 2022-07-22 NOTE — Telephone Encounter (Signed)
Patient would like to know the reason for her not coming in for her labs on 4/22? She said that she would like for the results to be available when she come in for her appointment on 4/29.Please advise.

## 2022-07-23 ENCOUNTER — Ambulatory Visit: Payer: Medicare HMO | Admitting: Internal Medicine

## 2022-07-29 DIAGNOSIS — H2512 Age-related nuclear cataract, left eye: Secondary | ICD-10-CM | POA: Diagnosis not present

## 2022-07-29 DIAGNOSIS — H18413 Arcus senilis, bilateral: Secondary | ICD-10-CM | POA: Diagnosis not present

## 2022-07-29 DIAGNOSIS — H25013 Cortical age-related cataract, bilateral: Secondary | ICD-10-CM | POA: Diagnosis not present

## 2022-07-29 DIAGNOSIS — H2513 Age-related nuclear cataract, bilateral: Secondary | ICD-10-CM | POA: Diagnosis not present

## 2022-07-29 DIAGNOSIS — H35342 Macular cyst, hole, or pseudohole, left eye: Secondary | ICD-10-CM | POA: Diagnosis not present

## 2022-08-05 ENCOUNTER — Ambulatory Visit: Payer: Medicare HMO | Admitting: Family Medicine

## 2022-08-11 ENCOUNTER — Other Ambulatory Visit: Payer: Medicare HMO

## 2022-08-12 ENCOUNTER — Ambulatory Visit: Payer: Medicare HMO | Admitting: Family

## 2022-08-15 ENCOUNTER — Ambulatory Visit: Payer: Medicare HMO | Admitting: Family

## 2022-08-18 ENCOUNTER — Ambulatory Visit (INDEPENDENT_AMBULATORY_CARE_PROVIDER_SITE_OTHER): Payer: Medicare HMO | Admitting: Family

## 2022-08-18 ENCOUNTER — Encounter: Payer: Self-pay | Admitting: Family

## 2022-08-18 ENCOUNTER — Telehealth: Payer: Self-pay | Admitting: Family

## 2022-08-18 VITALS — BP 122/72 | HR 77 | Temp 98.0°F | Ht 61.25 in | Wt 125.6 lb

## 2022-08-18 DIAGNOSIS — G8929 Other chronic pain: Secondary | ICD-10-CM | POA: Diagnosis not present

## 2022-08-18 DIAGNOSIS — E782 Mixed hyperlipidemia: Secondary | ICD-10-CM

## 2022-08-18 DIAGNOSIS — M5442 Lumbago with sciatica, left side: Secondary | ICD-10-CM | POA: Diagnosis not present

## 2022-08-18 DIAGNOSIS — F3341 Major depressive disorder, recurrent, in partial remission: Secondary | ICD-10-CM

## 2022-08-18 DIAGNOSIS — Z79899 Other long term (current) drug therapy: Secondary | ICD-10-CM

## 2022-08-18 DIAGNOSIS — D1803 Hemangioma of intra-abdominal structures: Secondary | ICD-10-CM | POA: Diagnosis not present

## 2022-08-18 DIAGNOSIS — M5441 Lumbago with sciatica, right side: Secondary | ICD-10-CM

## 2022-08-18 DIAGNOSIS — M8580 Other specified disorders of bone density and structure, unspecified site: Secondary | ICD-10-CM | POA: Diagnosis not present

## 2022-08-18 DIAGNOSIS — F3175 Bipolar disorder, in partial remission, most recent episode depressed: Secondary | ICD-10-CM

## 2022-08-18 DIAGNOSIS — Z0000 Encounter for general adult medical examination without abnormal findings: Secondary | ICD-10-CM

## 2022-08-18 DIAGNOSIS — M19012 Primary osteoarthritis, left shoulder: Secondary | ICD-10-CM | POA: Diagnosis not present

## 2022-08-18 LAB — COMPREHENSIVE METABOLIC PANEL
ALT: 16 U/L (ref 0–35)
AST: 21 U/L (ref 0–37)
Albumin: 4.3 g/dL (ref 3.5–5.2)
Alkaline Phosphatase: 38 U/L — ABNORMAL LOW (ref 39–117)
BUN: 15 mg/dL (ref 6–23)
CO2: 29 mEq/L (ref 19–32)
Calcium: 9.1 mg/dL (ref 8.4–10.5)
Chloride: 105 mEq/L (ref 96–112)
Creatinine, Ser: 0.96 mg/dL (ref 0.40–1.20)
GFR: 61.04 mL/min (ref >60.00)
Glucose, Bld: 89 mg/dL (ref 70–99)
Potassium: 3.9 mEq/L (ref 3.5–5.1)
Sodium: 141 mEq/L (ref 135–145)
Total Bilirubin: 0.5 mg/dL (ref 0.2–1.2)
Total Protein: 6.9 g/dL (ref 6.0–8.3)

## 2022-08-18 LAB — LIPID PANEL
Cholesterol: 182 mg/dL (ref 0–200)
HDL: 86.1 mg/dL (ref >39.00)
LDL Cholesterol: 84 mg/dL (ref 0–99)
NonHDL: 96.24
Total CHOL/HDL Ratio: 2
Triglycerides: 60 mg/dL (ref 0.0–149.0)
VLDL: 12 mg/dL (ref 0.0–40.0)

## 2022-08-18 NOTE — Progress Notes (Signed)
Established Patient Office Visit  Subjective:      CC:  Chief Complaint  Patient presents with   Medical Management of Chronic Issues    fasting    HPI: Rhonda Reeves is a 67 y.o. female presenting on 08/18/2022 for Medical Management of Chronic Issues (fasting) .  Shoulder and back pain, has improved with warmer weather. Suspected arthritis.  Prednisone really had helped her as well, this was back in 2/24.   Bipolar, stable for ten years. Still taking prozac 10 mg, lamictal 200 mg and also zyprexa.   HLD: still tolerating lipitor 10 mg once daily.  Lab Results  Component Value Date   CHOL 164 01/20/2022   HDL 80.00 01/20/2022   LDLCALC 71 01/20/2022   TRIG 65.0 01/20/2022   CHOLHDL 2 01/20/2022   Right sided sciatica, worse at the end of the day.  No change in posture or sitting/standing without much change. However at night will be worse and hard to get comfortable. Has taken some advil with relief.  Has been ongoing for about six months.  Does use muscle relaxer skelaxin as needed as well.         Social history:  Relevant past medical, surgical, family and social history reviewed and updated as indicated. Interim medical history since our last visit reviewed.  Allergies and medications reviewed and updated.  DATA REVIEWED: CHART IN EPIC     ROS: Negative unless specifically indicated above in HPI.    Current Outpatient Medications:    alendronate (FOSAMAX) 70 MG tablet, Take 1 tablet (70 mg total) by mouth once a week. Take with a full glass of water on an empty stomach., Disp: 12 tablet, Rfl: 3   aspirin 81 MG EC tablet, Take 1 tablet (81 mg total) by mouth daily. Swallow whole., Disp: 90 tablet, Rfl: 3   atorvastatin (LIPITOR) 10 MG tablet, Take 1 tablet (10 mg total) by mouth daily., Disp: 90 tablet, Rfl: 1   Calcium Carb-Cholecalciferol (CALCIUM 600 + D PO), Take 1 tablet by mouth daily., Disp: , Rfl:    COMIRNATY SUSP injection, , Disp: ,  Rfl:    FLUAD QUADRIVALENT 0.5 ML injection, , Disp: , Rfl:    FLUoxetine (PROZAC) 10 MG capsule, Take 1 capsule (10 mg total) by mouth daily., Disp: 90 capsule, Rfl: 2   lamoTRIgine (LAMICTAL) 200 MG tablet, TAKE ONE TABLET BY MOUTH ONE TIME DAILY, Disp: 90 tablet, Rfl: 3   Multiple Vitamins-Minerals (MULTIVITAMIN WITH MINERALS) tablet, Take 1 tablet by mouth daily., Disp: , Rfl:    OLANZapine (ZYPREXA) 2.5 MG tablet, TAKE ONE TABLET BY MOUTH DAILY IN THE MORNING, Disp: 90 tablet, Rfl: 3      Objective:    BP 122/72 (BP Location: Right Arm)   Pulse 77   Temp 98 F (36.7 C) (Temporal)   Ht 5' 1.25" (1.556 m)   Wt 125 lb 9.6 oz (57 kg)   SpO2 99%   BMI 23.54 kg/m   Wt Readings from Last 3 Encounters:  08/18/22 125 lb 9.6 oz (57 kg)  05/27/22 126 lb 9.6 oz (57.4 kg)  02/10/22 127 lb 4 oz (57.7 kg)    Physical Exam Musculoskeletal:     Lumbar back: Spasms and tenderness present. Normal range of motion. Positive right straight leg raise test.            Assessment & Plan:  Primary osteoarthritis of left shoulder  Hemangioma of intra-abdominal structure -  Comprehensive metabolic panel  On statin therapy -     Comprehensive metabolic panel -     Comprehensive metabolic panel; Future -     Lipid panel; Future  Mixed hyperlipidemia Assessment & Plan: Continue atorvastatin 10 mg qhs  Ordered lipid panel, pending results. Work on low cholesterol diet and exercise as tolerated   Orders: -     Lipid panel -     Lipid panel; Future  Encounter for annual general medical examination without abnormal findings in adult  Bipolar disorder, in partial remission, most recent episode depressed [facility]) Assessment & Plan: Due to stability we will continue refills from primary care, did advise pt If any new episode will refer to psychiatry.     Recurrent major depressive disorder, in partial remission (HCC) Assessment & Plan: stable   Osteopenia, unspecified  location Assessment & Plan: Continue weight bearing exercises as tolerated  Cont vit d and calcium    Chronic right-sided low back pain with bilateral sciatica Assessment & Plan: Discussed management, pt declines physical therapy for now.  Printed exercises, advised lidocaine patches, muscle relaxer prn, ibuprofen /tylenol prn        Return for f/u CPE.  Mort Sawyers, MSN, APRN, FNP-C [facility]

## 2022-08-18 NOTE — Assessment & Plan Note (Signed)
Continue atorvastatin 10 mg qhs  Ordered lipid panel, pending results. Work on low cholesterol diet and exercise as tolerated  

## 2022-08-18 NOTE — Assessment & Plan Note (Signed)
Continue weight bearing exercises as tolerated  Cont vit d and calcium

## 2022-08-18 NOTE — Assessment & Plan Note (Signed)
stable °

## 2022-08-18 NOTE — Telephone Encounter (Signed)
Contacted Rhonda Reeves to schedule their annual wellness visit. Appointment made for 09/24/2022.  [facility] Care Guide [facility] AWV TEAM Direct Dial: [phone]

## 2022-08-18 NOTE — Assessment & Plan Note (Signed)
Discussed management, pt declines physical therapy for now.  Printed exercises, advised lidocaine patches, muscle relaxer prn, ibuprofen /tylenol prn

## 2022-08-18 NOTE — Patient Instructions (Signed)
  Recommendations for sciatica: stretching exercises as well as lidocaine patches (over the counter) tylenol, motrin as needed.

## 2022-08-18 NOTE — Assessment & Plan Note (Signed)
Due to stability we will continue refills from primary care, did advise pt If any new episode will refer to psychiatry.   

## 2022-08-20 ENCOUNTER — Encounter (INDEPENDENT_AMBULATORY_CARE_PROVIDER_SITE_OTHER): Payer: Medicare HMO | Admitting: Ophthalmology

## 2022-08-20 DIAGNOSIS — H43813 Vitreous degeneration, bilateral: Secondary | ICD-10-CM | POA: Diagnosis not present

## 2022-08-20 DIAGNOSIS — H35373 Puckering of macula, bilateral: Secondary | ICD-10-CM

## 2022-08-20 DIAGNOSIS — H35342 Macular cyst, hole, or pseudohole, left eye: Secondary | ICD-10-CM

## 2022-10-09 ENCOUNTER — Ambulatory Visit (INDEPENDENT_AMBULATORY_CARE_PROVIDER_SITE_OTHER): Payer: Medicare HMO

## 2022-10-09 VITALS — Ht 62.0 in | Wt 122.0 lb

## 2022-10-09 DIAGNOSIS — Z1231 Encounter for screening mammogram for malignant neoplasm of breast: Secondary | ICD-10-CM

## 2022-10-09 DIAGNOSIS — Z0000 Encounter for general adult medical examination without abnormal findings: Secondary | ICD-10-CM

## 2022-10-09 NOTE — Patient Instructions (Addendum)
Rhonda Reeves , Thank you for taking time to come for your Medicare Wellness Visit. I appreciate your ongoing commitment to your health goals. Please review the following plan we discussed and let me know if I can assist you in the future.   These are the goals we discussed:  Goals      Patient Stated     No new goals     Weight (lb) < 130 lb (59 kg)     Working to continue to lose weight        This is a list of the screening recommended for you and due dates:  Health Maintenance  Topic Date Due   Colon Cancer Screening  08/18/2023*   COVID-19 Vaccine (6 - 2023-24 season) 08/18/2023*   Flu Shot  11/20/2022   Medicare Annual Wellness Visit  10/09/2023   Mammogram  02/28/2024   DTaP/Tdap/Td vaccine (2 - Td or Tdap) 07/03/2024   Pneumonia Vaccine  Completed   DEXA scan (bone density measurement)  Completed   Hepatitis C Screening  Completed   Zoster (Shingles) Vaccine  Completed   HPV Vaccine  Aged Out  *Topic was postponed. The date shown is not the original due date.   You have an order for:  []   2D Mammogram  [x]   3D Mammogram  []   Bone Density     Please call for appointment:   [facility] [employer] [facility] [phone]  [facility] -[facility]  [address] [place] [address] [phone]  [facility] [address] [place], [place] [address] [phone]  [place] [facility] [address] [place], [place] [address] [phone]  [facility] [address] [place], [place] [address] [phone]  [facility] [address] [place], [place] [address] [phone]  [facility] [place] [address] [place], [place] [address] [phone]  [facility] [place] [facility] Density [address] [place], [place] [address] [phone]  [facility]  Breast [facility] Center [address] [place], [place] [address] [phone]  [facility] [address] [place], [place] [address] [phone]  [facility] [address] [place], [place] [address] [phone]  [facility] [place] [address] [place], [place] [address] [phone]  [facility] at [facility] [address] [place], [place] [address] [phone]  [facility] [place] [address] [place], [place] [address] [phone]  [facility] [address] [place], [place] [address] [phone] [facility]  [facility] at [facility] [address] -Radiology [place], [place] [address] [phone]  [facility] [address], [place] [address] [phone]   Make sure to wear two-piece clothing.  No lotions powders or deodorants the day of the appointment Make sure to bring picture ID and insurance card.  Bring list of medications you are currently taking including any supplements.   Schedule your [place] screening mammogram through MyChart!   Log into your MyChart account.  Go to 'Visit' (or 'Appointments' if on mobile App) --> Schedule an Appointment  Under 'Select a Reason for Visit' choose the Mammogram Screening option.  Complete the pre-visit questions and select the time  and place that best fits your schedule.    Advanced directives: on file in chart.  Conditions/risks identified: Aim for 30 minutes of exercise or brisk walking, 6-8 glasses of water, and 5 servings of fruits and vegetables each day.   Next appointment: Follow up in one year for your annual wellness visit 10/12/23 @ 10:45   Preventive Care 65 Years and Older, Female Preventive care refers to  lifestyle choices and visits with your health care provider that can promote health and wellness. What does preventive care include? A yearly physical exam. This is also called an annual well check. Dental exams once or twice a year. Routine eye exams. Ask your health care provider how often you should have your eyes checked. Personal lifestyle choices, including: Daily care of your teeth and gums. Regular physical activity. Eating a healthy diet. Avoiding tobacco and drug use. Limiting alcohol use. Practicing safe sex. Taking low-dose aspirin every day. Taking vitamin and mineral supplements as recommended by your health care provider. What happens during an annual well check? The services and screenings done by your health care provider during your annual well check will depend on your age, overall health, lifestyle risk factors, and family history of disease. Counseling  Your health care provider may ask you questions about your: Alcohol use. Tobacco use. Drug use. Emotional well-being. Home and relationship well-being. Sexual activity. Eating habits. History of falls. Memory and ability to understand (cognition). Work and work Astronomer. Reproductive health. Screening  You may have the following tests or measurements: Height, weight, and BMI. Blood pressure. Lipid and cholesterol levels. These may be checked every 5 years, or more frequently if you are over 52 years old. Skin check. Lung cancer screening. You may have this screening every year starting at age 4 if you have a 30-pack-year history of smoking and currently smoke or have quit within the past 15 years. Fecal occult blood test (FOBT) of the stool. You may have this test every year starting at age 6. Flexible sigmoidoscopy or colonoscopy. You may have a sigmoidoscopy every 5 years or a colonoscopy every 10 years starting at age 28. Hepatitis C blood test. Hepatitis B blood test. Sexually transmitted disease  (STD) testing. Diabetes screening. This is done by checking your blood sugar (glucose) after you have not eaten for a while (fasting). You may have this done every 1-3 years. Bone density scan. This is done to screen for osteoporosis. You may have this done starting at age 55. Mammogram. This may be done every 1-2 years. Talk to your health care provider about how often you should have regular mammograms. Talk with your health care provider about your test results, treatment options, and if necessary, the need for more tests. Vaccines  Your health care provider may recommend certain vaccines, such as: Influenza vaccine. This is recommended every year. Tetanus, diphtheria, and acellular pertussis (Tdap, Td) vaccine. You may need a Td booster every 10 years. Zoster vaccine. You may need this after age 40. Pneumococcal 13-valent conjugate (PCV13) vaccine. One dose is recommended after age 81. Pneumococcal polysaccharide (PPSV23) vaccine. One dose is recommended after age 72. Talk to your health care provider about which screenings and vaccines you need and how often you need them. This information is not intended to replace advice given to you by your health care provider. Make sure you discuss any questions you have with your health care provider. Document Released: 05/04/2015 Document Revised: 12/26/2015 Document Reviewed: 02/06/2015 Elsevier Interactive Patient Education  2017  [employer].  Fall Prevention in the Home Falls can cause injuries. They can happen to people of all ages. There are many things you can do to make your home safe and to help prevent falls. What can I do on the outside of my home? Regularly fix the edges of walkways and driveways and fix any cracks. Remove anything that might make you trip as you walk through a door, such as a raised step or threshold. Trim any bushes or trees on the path to your home. Use bright outdoor lighting. Clear any walking paths of anything  that might make someone trip, such as rocks or tools. Regularly check to see if handrails are loose or broken. Make sure that both sides of any steps have handrails. Any raised decks and porches should have guardrails on the edges. Have any leaves, snow, or ice cleared regularly. Use sand or salt on walking paths during winter. Clean up any spills in your garage right away. This includes oil or grease spills. What can I do in the bathroom? Use night lights. Install grab bars by the toilet and in the tub and shower. Do not use towel bars as grab bars. Use non-skid mats or decals in the tub or shower. If you need to sit down in the shower, use a plastic, non-slip stool. Keep the floor dry. Clean up any water that spills on the floor as soon as it happens. Remove soap buildup in the tub or shower regularly. Attach bath mats securely with double-sided non-slip rug tape. Do not have throw rugs and other things on the floor that can make you trip. What can I do in the bedroom? Use night lights. Make sure that you have a light by your bed that is easy to reach. Do not use any sheets or blankets that are too big for your bed. They should not hang down onto the floor. Have a firm chair that has side arms. You can use this for support while you get dressed. Do not have throw rugs and other things on the floor that can make you trip. What can I do in the kitchen? Clean up any spills right away. Avoid walking on wet floors. Keep items that you use a lot in easy-to-reach places. If you need to reach something above you, use a strong step stool that has a grab bar. Keep electrical cords out of the way. Do not use floor polish or wax that makes floors slippery. If you must use wax, use non-skid floor wax. Do not have throw rugs and other things on the floor that can make you trip. What can I do with my stairs? Do not leave any items on the stairs. Make sure that there are handrails on both sides of  the stairs and use them. Fix handrails that are broken or loose. Make sure that handrails are as long as the stairways. Check any carpeting to make sure that it is firmly attached to the stairs. Fix any carpet that is loose or worn. Avoid having throw rugs at the top or bottom of the stairs. If you do have throw rugs, attach them to the floor with carpet tape. Make sure that you have a light switch at the top of the stairs and the bottom of the stairs. If you do not have them, ask someone to add them for you. What else can I do to help prevent falls? Wear shoes that: Do not have high heels. Have rubber bottoms. Are  comfortable and fit you well. Are closed at the toe. Do not wear sandals. If you use a stepladder: Make sure that it is fully opened. Do not climb a closed stepladder. Make sure that both sides of the stepladder are locked into place. Ask someone to hold it for you, if possible. Clearly mark and make sure that you can see: Any grab bars or handrails. First and last steps. Where the edge of each step is. Use tools that help you move around (mobility aids) if they are needed. These include: Canes. Walkers. Scooters. Crutches. Turn on the lights when you go into a dark area. Replace any light bulbs as soon as they burn out. Set up your furniture so you have a clear path. Avoid moving your furniture around. If any of your floors are uneven, fix them. If there are any pets around you, be aware of where they are. Review your medicines with your doctor. Some medicines can make you feel dizzy. This can increase your chance of falling. Ask your doctor what other things that you can do to help prevent falls. This information is not intended to replace advice given to you by your health care provider. Make sure you discuss any questions you have with your health care provider. Document Released: 02/01/2009 Document Revised: 09/13/2015 Document Reviewed: 05/12/2014 Elsevier Interactive  Patient Education  2017 [employer].

## 2022-10-09 NOTE — Progress Notes (Signed)
Subjective:   Rhonda Reeves is a 67 y.o. female who presents for Medicare Annual (Subsequent) preventive examination.  Visit Complete: Virtual  I connected with  Felicity Pellegrini on 10/09/22 by a audio enabled telemedicine application and verified that I am speaking with the correct person using two identifiers.  Patient Location: Home  Provider Location: Home Office  I discussed the limitations of evaluation and management by telemedicine. The patient expressed understanding and agreed to proceed.    Review of Systems      Cardiac Risk Factors include: advanced age (>39men, >41 women);dyslipidemia     Objective:    Today's Vitals   10/09/22 0829  Weight: 122 lb (55.3 kg)  Height: 5\' 2"  (1.575 m)   Body mass index is 22.31 kg/m.     10/09/2022    8:36 AM 05/12/2021    9:25 AM 02/05/2021    9:16 AM 01/22/2021    1:00 PM 01/18/2021   11:23 AM 02/02/2020    4:42 PM 11/18/2017    9:46 AM  Advanced Directives  Does Patient Have a Medical Advance Directive? Yes No Yes No No Yes Yes  Type of Estate agent of [place];Living will  Healthcare Power of Friend;Living will   Healthcare Power of [place];Living will Living will;Healthcare Power of Attorney  Does patient want to make changes to medical advance directive? No - Patient declined  No - Patient declined   No - Patient declined No - Patient declined  Copy of Healthcare Power of Attorney in Chart? Yes - validated most recent copy scanned in chart (See row information)  Yes - validated most recent copy scanned in chart (See row information)   No - copy requested No - copy requested  Would patient like information on creating a medical advance directive?  No - Patient declined  No - Patient declined No - Patient declined  No - Patient declined    Current Medications (verified) Outpatient Encounter Medications as of 10/09/2022  Medication Sig   alendronate (FOSAMAX) 70 MG tablet Take 1 tablet (70 mg  total) by mouth once a week. Take with a full glass of water on an empty stomach.   aspirin 81 MG EC tablet Take 1 tablet (81 mg total) by mouth daily. Swallow whole.   atorvastatin (LIPITOR) 10 MG tablet Take 1 tablet (10 mg total) by mouth daily.   Calcium Carb-Cholecalciferol (CALCIUM 600 + D PO) Take 1 tablet by mouth daily.   FLUoxetine (PROZAC) 10 MG capsule Take 1 capsule (10 mg total) by mouth daily.   lamoTRIgine (LAMICTAL) 200 MG tablet TAKE ONE TABLET BY MOUTH ONE TIME DAILY   Multiple Vitamins-Minerals (MULTIVITAMIN WITH MINERALS) tablet Take 1 tablet by mouth daily.   OLANZapine (ZYPREXA) 2.5 MG tablet TAKE ONE TABLET BY MOUTH DAILY IN THE MORNING   COMIRNATY SUSP injection  (Patient not taking: Reported on 10/09/2022)   FLUAD QUADRIVALENT 0.5 ML injection  (Patient not taking: Reported on 10/09/2022)   No facility-administered encounter medications on file as of 10/09/2022.    Allergies (verified) Sulfa antibiotics   History: Past Medical History:  Diagnosis Date   Cataracts, bilateral    Closed fracture of upper end of left fibula 05/28/2021   Depression    Fracture of body of sternum, subsequent encounter for fracture with routine healing 05/28/2021   Glaucoma    History of kidney stones    H/O   Osteoporosis    Past Surgical History:  Procedure Laterality Date  ABDOMINAL HYSTERECTOMY     has 1 ovary left   BREAST BIOPSY Right 02/24/2018   affirm bx, coil clip, radial scar   BREAST EXCISIONAL BIOPSY Right 03/09/2018   Fibercystic change FOCAL USUAL DUCT HYPERPLASIA NEG MARGINS   BREAST LUMPECTOMY Right 03/09/2018   radial scar   BREAST LUMPECTOMY WITH NEEDLE LOCALIZATION Right 03/09/2018   Procedure: BREAST LUMPECTOMY WITH NEEDLE LOCALIZATION;  Surgeon: Duanne Guess, MD;  Location: [facility] ORS;  Service: General;  Laterality: Right;   CHOLECYSTECTOMY N/A 11/21/2017   Procedure: LAPAROSCOPIC CHOLECYSTECTOMY WITH INTRAOPERATIVE CHOLANGIOGRAM;  Surgeon: Lattie Haw, MD;  Location: [facility] ORS;  Service: General;  Laterality: N/A;   ENDOSCOPIC RETROGRADE CHOLANGIOPANCREATOGRAPHY (ERCP) WITH PROPOFOL N/A 11/20/2017   Procedure: ENDOSCOPIC RETROGRADE CHOLANGIOPANCREATOGRAPHY (ERCP) WITH PROPOFOL;  Surgeon: Midge Minium, MD;  Location: [facility] ENDOSCOPY;  Service: Endoscopy;  Laterality: N/A;   HEMORRHOID SURGERY     Family History  Problem Relation Age of Onset   Osteoporosis Mother    Graves' disease Mother    Osteoporosis Father    Hernia Father    Heart disease Father    Kidney Stones Father    Hypotension Father    Osteoporosis Paternal Grandmother    Healthy Brother    Breast cancer Neg Hx    Social History   Socioeconomic History   Marital status: Married    Spouse name: Deniece Portela   Number of children: 0   Years of education: bachelors degree   Highest education level: Bachelor's degree (e.g., BA, AB, BS)  Occupational History   Not on file  Tobacco Use   Smoking status: Former    Packs/day: 1.00    Years: 35.00    Additional pack years: 0.00    Total pack years: 35.00    Types: Cigarettes    Quit date: 03/03/2005    Years since quitting: 17.6   Smokeless tobacco: Never  Vaping Use   Vaping Use: Never used  Substance and Sexual Activity   Alcohol use: Yes    Alcohol/week: 1.0 standard drink of alcohol    Types: 1 Cans of beer per week    Comment: two beers daily   Drug use: No   Sexual activity: Yes    Partners: Male    Birth control/protection: Surgical, Post-menopausal  Other Topics Concern   Not on file  Social History Narrative   08/01/19   From[place] settled here when she came for [place] settled [place]   Living: with husband Luisa Hart (2006) - but also high school sweethearts   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      Family: mother in [place] and brother in [place] - good relationship      Enjoys: hike, gardening, watching movies      Exercise: walking 5 nights a week, stretches and weights in the morning   Diet: oatmeal  breakfast, yogurt/banana/apple lunch, brown rice + veggies, veggies w/ pasta, pizza - chicken/salmon       Safety   Seat belts: Yes    Guns: no   Safe in relationships: Yes    Social Determinants of Health   Financial Resource Strain: Low Risk  (10/09/2022)   Overall Financial Resource Strain (CARDIA)    Difficulty of Paying Living Expenses: Not hard at all  Food Insecurity: No Food Insecurity (10/09/2022)   Hunger Vital Sign    Worried About Running Out of Food in the Last Year: Never true    Ran Out of Food in the Last Year: Never true  Transportation  Needs: No Transportation Needs (10/09/2022)   PRAPARE - Administrator, Civil Service (Medical): No    Lack of Transportation (Non-Medical): No  Physical Activity: Sufficiently Active (10/09/2022)   Exercise Vital Sign    Days of Exercise per Week: 6 days    Minutes of Exercise per Session: 30 min  Stress: No Stress Concern Present (10/09/2022)   [employer] of Occupational Health - Occupational Stress Questionnaire    Feeling of Stress : Not at all  Social Connections: Socially Integrated (10/09/2022)   Social Connection and Isolation Panel [NHANES]    Frequency of Communication with Friends and Family: More than three times a week    Frequency of Social Gatherings with Friends and Family: Once a week    Attends Religious Services: More than 4 times per year    Active Member of [employer] or Organizations: Yes    Attends Engineer, structural: More than 4 times per year    Marital Status: Married    Tobacco Counseling Counseling given: Not Answered   Clinical Intake:  Pre-visit preparation completed: Yes  Pain : No/denies pain     Nutritional Risks: None Diabetes: No  How often do you need to have someone help you when you read instructions, pamphlets, or other written materials from your doctor or pharmacy?: 1 - Never  Interpreter Needed?: No  Information entered by :: C.Ella Golomb  LPN   Activities of Daily Living    10/09/2022    8:41 AM 10/08/2022    2:01 PM  In your present state of health, do you have any difficulty performing the following activities:  Hearing? 0 1  Vision? 1 1  Comment Cataracts, and hole in left retina   Difficulty concentrating or making decisions? 0 0  Walking or climbing stairs? 0 0  Dressing or bathing? 0 0  Doing errands, shopping? 0 0  Preparing Food and eating ? N N  Using the Toilet? N N  In the past six months, have you accidently leaked urine? N N  Do you have problems with loss of bowel control? N N  Managing your Medications? N N  Managing your Finances? N N  Housekeeping or managing your Housekeeping? N N    Patient Care Team: Mort Sawyers, FNP as PCP - General (Family Medicine) Sherrie George, MD as Consulting Physician (Ophthalmology)  Indicate any recent Medical Services you may have received from other than [facility] providers in the past year (date may be approximate).     Assessment:   This is a routine wellness examination for Rhonda Reeves.  Hearing/Vision screen Hearing Screening - Comments:: Has occasional ringing in the ears but can still hear ok. Vision Screening - Comments:: Glasses - Dr.Bell / Dr.Matthews (retina specialist)  Dietary issues and exercise activities discussed:     Goals Addressed             This Visit's Progress    Patient Stated       No new goals       Depression Screen    10/09/2022    8:35 AM 08/18/2022    9:18 AM 05/27/2022   10:50 AM 02/10/2022   11:18 AM 02/10/2022   10:45 AM 02/05/2021   10:01 AM 08/20/2020    3:50 PM  PHQ 2/9 Scores  PHQ - 2 Score 0 0 0 0 0 1 2  PHQ- 9 Score   0 0  3 7    Fall Risk    10/09/2022  8:38 AM 10/08/2022    2:01 PM 08/18/2022    9:18 AM 05/27/2022   10:50 AM 02/10/2022   10:45 AM  Fall Risk   Falls in the past year? 0 0 0 0 0  Number falls in past yr: 0 0 0 0 0  Injury with Fall? 0 0 0 0 0  Risk for fall due to : No Fall Risks     No Fall Risks  Follow up Falls prevention discussed;Falls evaluation completed  Falls evaluation completed;Education provided;Falls prevention discussed Falls evaluation completed;Education provided     MEDICARE RISK AT HOME:  Medicare Risk at Home - 10/09/22 0844     Any stairs in or around the home? Yes    If so, are there any without handrails? No    Home free of loose throw rugs in walkways, pet beds, electrical cords, etc? Yes    Adequate lighting in your home to reduce risk of falls? Yes    Life alert? No    Use of a cane, walker or w/c? No    Grab bars in the bathroom? No    Shower chair or bench in shower? No    Elevated toilet seat or a handicapped toilet? No             Cognitive Function:        10/09/2022    8:42 AM  6CIT Screen  What Year? 0 points  What month? 0 points  What time? 0 points  Count back from 20 0 points  Months in reverse 0 points  Repeat phrase 0 points  Total Score 0 points    Immunizations Immunization History  Administered Date(s) Administered   Fluad Quad(high Dose 65+) 01/10/2020, 01/04/2021, 12/28/2021   Influenza Inj Mdck Quad Pf 02/07/2017   Influenza,inj,Quad PF,6+ Mos 06/26/2009, 01/18/2015, 01/30/2016, 01/10/2019   PFIZER Comirnaty(Gray Top)Covid-19 Tri-Sucrose Vaccine 01/24/2022   PFIZER(Purple Top)SARS-COV-2 Vaccination 07/23/2019, 08/17/2019, 02/04/2020   Pfizer Covid-19 Vaccine Bivalent Booster 49yrs & up 02/05/2021   Pneumococcal Conjugate-13 01/10/2020   Pneumococcal Polysaccharide-23 02/10/2022   Tdap 07/04/2014   Zoster Recombinat (Shingrix) 08/03/2021, 10/06/2021   Zoster, Live 03/29/2015    TDAP status: Up to date  Flu Vaccine status: Up to date  Pneumococcal vaccine status: Up to date  Covid-19 vaccine status: Completed vaccines  Qualifies for Shingles Vaccine? Yes   Zostavax completed No   Shingrix Completed?: Yes  Screening Tests Health Maintenance  Topic Date Due   Colonoscopy  08/18/2023  (Originally 09/13/2022)   COVID-19 Vaccine (6 - 2023-24 season) 08/18/2023 (Originally 03/21/2022)   INFLUENZA VACCINE  11/20/2022   Medicare Annual Wellness (AWV)  10/09/2023   MAMMOGRAM  02/28/2024   DTaP/Tdap/Td (2 - Td or Tdap) 07/03/2024   Pneumonia Vaccine 49+ Years old  Completed   DEXA SCAN  Completed   Hepatitis C Screening  Completed   Zoster Vaccines- Shingrix  Completed   HPV VACCINES  Aged Out    Health Maintenance  There are no preventive care reminders to display for this patient.  Colorectal cancer screening: Type of screening: Colonoscopy. Completed 09/13/19. Repeat every 3 years per pt, has appointment scheduled in October.  Mammogram status: Completed 02/27/22. Repeat every year  Bone Density status: Completed 02/27/22. Results reflect: Bone density results: OSTEOPENIA. Repeat every 2 years.  Lung Cancer Screening: (Low Dose CT Chest recommended if Age 31-80 years, 20 pack-year currently smoking OR have quit w/in 15years.) does not qualify.   Lung Cancer Screening Referral: no  Additional Screening:  Hepatitis C Screening: does qualify; Completed 03/31/17  Vision Screening: Recommended annual ophthalmology exams for early detection of glaucoma and other disorders of the eye. Is the patient up to date with their annual eye exam?  Yes  Who is the provider or what is the name of the office in which the patient attends annual eye exams? Dr.Bell and Dr.Matthews If pt is not established with a provider, would they like to be referred to a provider to establish care? Yes .   Dental Screening: Recommended annual dental exams for proper oral hygiene   Community Resource Referral / Chronic Care Management: CRR required this visit?  No   CCM required this visit?  No     Plan:     I have personally reviewed and noted the following in the patient's chart:   Medical and social history Use of alcohol, tobacco or illicit drugs  Current medications and supplements  including opioid prescriptions. Patient is not currently taking opioid prescriptions. Functional ability and status Nutritional status Physical activity Advanced directives List of other physicians Hospitalizations, surgeries, and ER visits in previous 12 months Vitals Screenings to include cognitive, depression, and falls Referrals and appointments  In addition, I have reviewed and discussed with patient certain preventive protocols, quality metrics, and best practice recommendations. A written personalized care plan for preventive services as well as general preventive health recommendations were provided to patient.     Maryan Puls, LPN   09/23/5407   After Visit Summary: (MyChart) Due to this being a telephonic visit, the after visit summary with patients personalized plan was offered to patient via MyChart   Nurse Notes: Pt scheduled for cataract removal in July. Order for mammogram placed.

## 2022-11-10 DIAGNOSIS — H2512 Age-related nuclear cataract, left eye: Secondary | ICD-10-CM | POA: Diagnosis not present

## 2022-11-11 DIAGNOSIS — H2511 Age-related nuclear cataract, right eye: Secondary | ICD-10-CM | POA: Diagnosis not present

## 2022-11-11 DIAGNOSIS — H25041 Posterior subcapsular polar age-related cataract, right eye: Secondary | ICD-10-CM | POA: Diagnosis not present

## 2022-11-11 DIAGNOSIS — H25011 Cortical age-related cataract, right eye: Secondary | ICD-10-CM | POA: Diagnosis not present

## 2022-11-17 ENCOUNTER — Other Ambulatory Visit: Payer: Self-pay | Admitting: Family

## 2022-11-17 DIAGNOSIS — F3175 Bipolar disorder, in partial remission, most recent episode depressed: Secondary | ICD-10-CM

## 2022-11-17 DIAGNOSIS — I700 Atherosclerosis of aorta: Secondary | ICD-10-CM

## 2022-11-17 DIAGNOSIS — F3341 Major depressive disorder, recurrent, in partial remission: Secondary | ICD-10-CM

## 2022-11-24 DIAGNOSIS — H2511 Age-related nuclear cataract, right eye: Secondary | ICD-10-CM | POA: Diagnosis not present

## 2022-12-08 ENCOUNTER — Encounter (INDEPENDENT_AMBULATORY_CARE_PROVIDER_SITE_OTHER): Payer: Medicare HMO | Admitting: Ophthalmology

## 2022-12-29 DIAGNOSIS — Z0100 Encounter for examination of eyes and vision without abnormal findings: Secondary | ICD-10-CM | POA: Diagnosis not present

## 2023-02-06 ENCOUNTER — Other Ambulatory Visit (INDEPENDENT_AMBULATORY_CARE_PROVIDER_SITE_OTHER): Payer: Medicare HMO

## 2023-02-06 DIAGNOSIS — Z79899 Other long term (current) drug therapy: Secondary | ICD-10-CM

## 2023-02-06 DIAGNOSIS — E782 Mixed hyperlipidemia: Secondary | ICD-10-CM | POA: Diagnosis not present

## 2023-02-06 DIAGNOSIS — Z01818 Encounter for other preprocedural examination: Secondary | ICD-10-CM | POA: Diagnosis not present

## 2023-02-06 LAB — LIPID PANEL
Cholesterol: 168 mg/dL (ref 0–200)
HDL: 86.1 mg/dL (ref >39.00)
LDL Cholesterol: 69 mg/dL (ref 0–99)
NonHDL: 82.3
Total CHOL/HDL Ratio: 2
Triglycerides: 65 mg/dL (ref 0.0–149.0)
VLDL: 13 mg/dL (ref 0.0–40.0)

## 2023-02-06 LAB — COMPREHENSIVE METABOLIC PANEL
ALT: 17 U/L (ref 0–35)
AST: 22 U/L (ref 0–37)
Albumin: 4.3 g/dL (ref 3.5–5.2)
Alkaline Phosphatase: 50 U/L (ref 39–117)
BUN: 13 mg/dL (ref 6–23)
CO2: 30 meq/L (ref 19–32)
Calcium: 9.4 mg/dL (ref 8.4–10.5)
Chloride: 104 meq/L (ref 96–112)
Creatinine, Ser: 1.03 mg/dL (ref 0.40–1.20)
GFR: 55.91 mL/min — ABNORMAL LOW (ref >60.00)
Glucose, Bld: 92 mg/dL (ref 70–99)
Potassium: 4 meq/L (ref 3.5–5.1)
Sodium: 142 meq/L (ref 135–145)
Total Bilirubin: 0.6 mg/dL (ref 0.2–1.2)
Total Protein: 6.9 g/dL (ref 6.0–8.3)

## 2023-02-12 ENCOUNTER — Encounter: Payer: Medicare HMO | Admitting: Family

## 2023-02-13 ENCOUNTER — Encounter: Payer: Medicare HMO | Admitting: Family

## 2023-02-18 ENCOUNTER — Encounter: Payer: Medicare HMO | Admitting: Family

## 2023-02-22 ENCOUNTER — Other Ambulatory Visit: Payer: Self-pay | Admitting: Family

## 2023-02-22 DIAGNOSIS — I700 Atherosclerosis of aorta: Secondary | ICD-10-CM

## 2023-03-02 ENCOUNTER — Other Ambulatory Visit: Payer: Self-pay | Admitting: Family

## 2023-03-02 DIAGNOSIS — M816 Localized osteoporosis [Lequesne]: Secondary | ICD-10-CM

## 2023-03-05 ENCOUNTER — Ambulatory Visit
Admission: RE | Admit: 2023-03-05 | Discharge: 2023-03-05 | Disposition: A | Discharge status: Home / Self Care | Payer: Medicare HMO | Source: Ambulatory Visit | Attending: Family | Admitting: Family

## 2023-03-05 DIAGNOSIS — Z1231 Encounter for screening mammogram for malignant neoplasm of breast: Secondary | ICD-10-CM | POA: Insufficient documentation

## 2023-03-13 ENCOUNTER — Encounter: Payer: Self-pay | Admitting: Family

## 2023-03-13 ENCOUNTER — Ambulatory Visit (INDEPENDENT_AMBULATORY_CARE_PROVIDER_SITE_OTHER): Payer: Medicare HMO | Admitting: Family

## 2023-03-13 VITALS — BP 110/72 | HR 78 | Temp 97.5°F | Ht 61.75 in | Wt 129.5 lb

## 2023-03-13 DIAGNOSIS — F3341 Major depressive disorder, recurrent, in partial remission: Secondary | ICD-10-CM

## 2023-03-13 DIAGNOSIS — Z0000 Encounter for general adult medical examination without abnormal findings: Secondary | ICD-10-CM | POA: Diagnosis not present

## 2023-03-13 DIAGNOSIS — R590 Localized enlarged lymph nodes: Secondary | ICD-10-CM | POA: Diagnosis not present

## 2023-03-13 DIAGNOSIS — E782 Mixed hyperlipidemia: Secondary | ICD-10-CM

## 2023-03-13 DIAGNOSIS — I700 Atherosclerosis of aorta: Secondary | ICD-10-CM | POA: Diagnosis not present

## 2023-03-13 DIAGNOSIS — M816 Localized osteoporosis [Lequesne]: Secondary | ICD-10-CM

## 2023-03-13 NOTE — Assessment & Plan Note (Signed)
Stable.  Continue current medications.

## 2023-03-13 NOTE — Assessment & Plan Note (Signed)
On fosamax  Will order annual dexa pending results.  Cont with weight bearing exercises and calcium vitamin D

## 2023-03-13 NOTE — Patient Instructions (Addendum)
  I have sent an electronic order over to your preferred location for the following:   []   2D Mammogram  []   3D Mammogram  [x]   Bone Density   Please give this center a call to get scheduled at your convenience.  [x]   [facility]  [address] [place] [address]  [phone]  Make sure to wear two piece  clothing  No lotions powders or deodorants the day of the appointment Make sure to bring picture ID and insurance card.  Bring list of medications you are currently taking including any supplements.   ------------------------------------  I have ordered imaging for you at [facility] outpatient diagnostic center. For a neck ultrasound This order has been sent over for you electronically.  Please call [phone] to schedule this appointment.  ------------------------------------

## 2023-03-13 NOTE — Assessment & Plan Note (Signed)

## 2023-03-13 NOTE — Assessment & Plan Note (Signed)
At goal LDL < 70  Continue atorvastatin 10 mg nightly

## 2023-03-13 NOTE — Progress Notes (Signed)
Subjective:  Patient ID: Rhonda Reeves, female    DOB: [birth year]  Age: 67 y.o. MRN: [id number]  Patient Care Team: Mort Sawyers, FNP as PCP - General (Family Medicine) Sherrie George, MD as Consulting Physician (Ophthalmology)   CC:  Chief Complaint  Patient presents with   Annual Exam    HPI Rhonda Reeves is a 67 y.o. female who presents today for an annual physical exam. She reports consuming a general diet.  Weights and stretches with walking several times a week, at least five times a week  She generally feels well. She reports sleeping well. She does not have additional problems to discuss today.   Vision:Within last year Dental:Receives regular dental care Cataract surgery July 22nd then August 5th.   Lung Cancer Screening with low-dose Chest CT: n/a  Smoking x 35 years quit in 2006  Mammogram: 02/23/23 Last pap: > 39 y/o  Colonoscopy: 09/13/19 every three years , already scheduled.  Bone density scan: bone density 02/27/22  Pt is without acute concerns.   Advanced Directives Patient does have advanced directives including living will and healthcare power of attorney. She does have a copy in the electronic medical record.   DEPRESSION SCREENING    03/13/2023   10:12 AM 10/09/2022    8:35 AM 08/18/2022    9:18 AM 05/27/2022   10:50 AM 02/10/2022   11:18 AM 02/10/2022   10:45 AM 02/05/2021   10:01 AM  PHQ 2/9 Scores  PHQ - 2 Score 0 0 0 0 0 0 1  PHQ- 9 Score 0   0 0  3     ROS: Negative unless specifically indicated above in HPI.    Current Outpatient Medications:    alendronate (FOSAMAX) 70 MG tablet, TAKE 1 TABLET BY MOUTH ONCE A WEEK, WITH A FULL GLASS OF WATER ON EMPTY STOMACH, Disp: 12 tablet, Rfl: 0   aspirin 81 MG EC tablet, Take 1 tablet (81 mg total) by mouth daily. Swallow whole., Disp: 90 tablet, Rfl: 3   atorvastatin (LIPITOR) 10 MG tablet, TAKE ONE TABLET BY MOUTH ONCE A DAY, Disp: 90 tablet, Rfl: 0   Calcium Carb-Cholecalciferol (CALCIUM  600 + D PO), Take 1 tablet by mouth daily., Disp: , Rfl:    FLUoxetine (PROZAC) 10 MG capsule, TAKE ONE CAPSULE BY MOUTH ONE TIME DAILY, Disp: 90 capsule, Rfl: 0   lamoTRIgine (LAMICTAL) 200 MG tablet, TAKE ONE TABLET BY MOUTH ONE TIME DAILY, Disp: 90 tablet, Rfl: 3   Multiple Vitamins-Minerals (MULTIVITAMIN WITH MINERALS) tablet, Take 1 tablet by mouth daily., Disp: , Rfl:    OLANZapine (ZYPREXA) 2.5 MG tablet, TAKE ONE TABLET BY MOUTH DAILY IN THE MORNING, Disp: 90 tablet, Rfl: 3    Objective:    BP 110/72 (BP Location: Left Arm, Patient Position: Sitting, Cuff Size: Normal)   Pulse 78   Temp (!) 97.5 F (36.4 C) (Temporal)   Ht 5' 1.75" (1.568 m)   Wt 129 lb 8 oz (58.7 kg)   SpO2 98%   BMI 23.88 kg/m   BP Readings from Last 3 Encounters:  03/13/23 110/72  08/18/22 122/72  05/27/22 130/72      Physical Exam Constitutional:      General: She is not in acute distress.    Appearance: Normal appearance. She is normal weight. She is not ill-appearing.  HENT:     Head: Normocephalic.     Right Ear: Tympanic membrane normal.     Left Ear: Tympanic membrane  normal.     Nose: Nose normal.     Mouth/Throat:     Mouth: Mucous membranes are moist.  Eyes:     Extraocular Movements: Extraocular movements intact.     Pupils: Pupils are equal, round, and reactive to light.  Cardiovascular:     Rate and Rhythm: Normal rate and regular rhythm.  Pulmonary:     Effort: Pulmonary effort is normal.     Breath sounds: Normal breath sounds.  Abdominal:     General: Abdomen is flat. Bowel sounds are normal.     Palpations: Abdomen is soft.     Tenderness: There is no guarding or rebound.  Musculoskeletal:        General: Normal range of motion.     Cervical back: Normal range of motion.  Lymphadenopathy:     Cervical: Cervical adenopathy present.     Right cervical: Deep cervical adenopathy (nontender) present.     Left cervical: Deep cervical adenopathy (nontender) present.  Skin:     General: Skin is warm.     Capillary Refill: Capillary refill takes less than 2 seconds.  Neurological:     General: No focal deficit present.     Mental Status: She is alert.  Psychiatric:        Mood and Affect: Mood normal.        Behavior: Behavior normal.        Thought Content: Thought content normal.        Judgment: Judgment normal.          Assessment & Plan:  Localized osteoporosis of spine Assessment & Plan: On fosamax  Will order annual dexa pending results.  Cont with weight bearing exercises and calcium vitamin D  Orders: -     DG Bone Density; Future  Cervical lymphadenopathy Assessment & Plan: Fixed, nonmobile, nontender.  Reassuring as bil  Ordering u/s soft tissue neck pending results.  If any findings, will consider CBC   Orders: -     US SOFT TISSUE HEAD & NECK (NON-THYROID); Future  Recurrent major depressive disorder, in partial remission (HCC) Assessment & Plan: Stable Continue current medications   Mixed hyperlipidemia Assessment & Plan: At goal LDL < 70  Continue atorvastatin 10 mg nightly   Aortic atherosclerosis (HCC) Assessment & Plan: LDL at goal Pt to maintain low chol diet   Encounter for general adult medical examination without abnormal findings Assessment & Plan: Patient Counseling(The following topics were reviewed):  Preventative care handout given to pt  Health maintenance and immunizations reviewed. Please refer to Health maintenance section. Pt advised on safe sex, wearing seatbelts in car, and proper nutrition labwork ordered today for annual Dental health: Discussed importance of regular tooth brushing, flossing, and dental visits.        Follow-up: Return in about 1 year (around 03/12/2024) for f/u CPE.   Mort Sawyers, FNP

## 2023-03-13 NOTE — Assessment & Plan Note (Signed)
LDL at goal Pt to maintain low chol diet

## 2023-03-13 NOTE — Assessment & Plan Note (Signed)
Fixed, nonmobile, nontender.  Reassuring as bil  Ordering u/s soft tissue neck pending results.  If any findings, will consider CBC

## 2023-03-23 ENCOUNTER — Ambulatory Visit: Payer: Medicare HMO

## 2023-03-23 DIAGNOSIS — K640 First degree hemorrhoids: Secondary | ICD-10-CM | POA: Diagnosis not present

## 2023-03-23 DIAGNOSIS — K5730 Diverticulosis of large intestine without perforation or abscess without bleeding: Secondary | ICD-10-CM | POA: Diagnosis not present

## 2023-03-23 DIAGNOSIS — Z09 Encounter for follow-up examination after completed treatment for conditions other than malignant neoplasm: Secondary | ICD-10-CM | POA: Diagnosis not present

## 2023-03-23 DIAGNOSIS — Z1211 Encounter for screening for malignant neoplasm of colon: Secondary | ICD-10-CM | POA: Diagnosis not present

## 2023-03-23 DIAGNOSIS — Z860100 Personal history of colon polyps, unspecified: Secondary | ICD-10-CM | POA: Diagnosis not present

## 2023-03-25 ENCOUNTER — Ambulatory Visit
Admission: RE | Admit: 2023-03-25 | Discharge: 2023-03-25 | Disposition: A | Discharge status: Home / Self Care | Payer: Medicare HMO | Source: Ambulatory Visit | Attending: Family | Admitting: Family

## 2023-03-25 DIAGNOSIS — R590 Localized enlarged lymph nodes: Secondary | ICD-10-CM | POA: Diagnosis not present

## 2023-05-03 ENCOUNTER — Other Ambulatory Visit: Payer: Self-pay | Admitting: Family

## 2023-05-15 ENCOUNTER — Other Ambulatory Visit: Payer: Self-pay | Admitting: Family

## 2023-05-15 DIAGNOSIS — F3175 Bipolar disorder, in partial remission, most recent episode depressed: Secondary | ICD-10-CM

## 2023-05-15 DIAGNOSIS — F3341 Major depressive disorder, recurrent, in partial remission: Secondary | ICD-10-CM

## 2023-05-18 ENCOUNTER — Other Ambulatory Visit: Payer: Self-pay | Admitting: Family

## 2023-05-18 DIAGNOSIS — F3175 Bipolar disorder, in partial remission, most recent episode depressed: Secondary | ICD-10-CM

## 2023-05-18 DIAGNOSIS — F3341 Major depressive disorder, recurrent, in partial remission: Secondary | ICD-10-CM

## 2023-05-18 NOTE — Telephone Encounter (Signed)
Copied from CRM [id number]. Topic: Clinical - Medication Refill >> May 18, 2023 12:29 PM Isabell A wrote: Most Recent Primary Care Visit:  Provider: Mort Sawyers  Department: [facility]  Visit Type: PHYSICAL  Date: 03/13/2023  Medication: FLUoxetine (PROZAC) 10 MG capsule  Has the patient contacted their pharmacy? Yes (Agent: If no, request that the patient contact the pharmacy for the refill. If patient does not wish to contact the pharmacy document the reason why and proceed with request.) (Agent: If yes, when and what did the pharmacy advise?)  Is this the correct pharmacy for this prescription? Yes If no, delete pharmacy and type the correct one.  This is the patient's preferred pharmacy:  [facility] [address], [place] - [address] [place] [address] Phone: [phone] Fax: [phone]  Has the prescription been filled recently? Yes  Is the patient out of the medication? Yes  Has the patient been seen for an appointment in the last year OR does the patient have an upcoming appointment? Yes  Can we respond through MyChart? Yes  Agent: Please be advised that Rx refills may take up to 3 business days. We ask that you follow-up with your pharmacy.

## 2023-05-20 DIAGNOSIS — H02403 Unspecified ptosis of bilateral eyelids: Secondary | ICD-10-CM | POA: Diagnosis not present

## 2023-05-21 ENCOUNTER — Other Ambulatory Visit: Payer: Self-pay | Admitting: Family

## 2023-05-21 DIAGNOSIS — M816 Localized osteoporosis [Lequesne]: Secondary | ICD-10-CM

## 2023-05-26 ENCOUNTER — Other Ambulatory Visit: Payer: Self-pay | Admitting: Family

## 2023-05-26 DIAGNOSIS — I700 Atherosclerosis of aorta: Secondary | ICD-10-CM

## 2023-06-03 ENCOUNTER — Ambulatory Visit
Admission: RE | Admit: 2023-06-03 | Discharge: 2023-06-03 | Disposition: A | Discharge status: Home / Self Care | Payer: Medicare HMO | Source: Ambulatory Visit | Attending: Family | Admitting: Family

## 2023-06-03 DIAGNOSIS — M816 Localized osteoporosis [Lequesne]: Secondary | ICD-10-CM | POA: Diagnosis not present

## 2023-06-03 DIAGNOSIS — Z780 Asymptomatic menopausal state: Secondary | ICD-10-CM | POA: Diagnosis not present

## 2023-06-03 DIAGNOSIS — M810 Age-related osteoporosis without current pathological fracture: Secondary | ICD-10-CM | POA: Diagnosis not present

## 2023-06-04 ENCOUNTER — Encounter: Payer: Self-pay | Admitting: Family

## 2023-06-14 ENCOUNTER — Other Ambulatory Visit: Payer: Self-pay | Admitting: Family

## 2023-06-14 DIAGNOSIS — F3341 Major depressive disorder, recurrent, in partial remission: Secondary | ICD-10-CM

## 2023-06-14 DIAGNOSIS — F3175 Bipolar disorder, in partial remission, most recent episode depressed: Secondary | ICD-10-CM

## 2023-07-21 DIAGNOSIS — L578 Other skin changes due to chronic exposure to nonionizing radiation: Secondary | ICD-10-CM | POA: Diagnosis not present

## 2023-07-21 DIAGNOSIS — H5340 Unspecified visual field defects: Secondary | ICD-10-CM | POA: Diagnosis not present

## 2023-07-21 DIAGNOSIS — H02839 Dermatochalasis of unspecified eye, unspecified eyelid: Secondary | ICD-10-CM | POA: Diagnosis not present

## 2023-07-21 DIAGNOSIS — H02403 Unspecified ptosis of bilateral eyelids: Secondary | ICD-10-CM | POA: Diagnosis not present

## 2023-07-30 ENCOUNTER — Other Ambulatory Visit: Payer: Self-pay | Admitting: Family

## 2023-08-17 ENCOUNTER — Other Ambulatory Visit: Payer: Self-pay | Admitting: Family

## 2023-08-17 ENCOUNTER — Encounter (INDEPENDENT_AMBULATORY_CARE_PROVIDER_SITE_OTHER): Payer: Medicare HMO | Admitting: Ophthalmology

## 2023-08-17 DIAGNOSIS — H35373 Puckering of macula, bilateral: Secondary | ICD-10-CM | POA: Diagnosis not present

## 2023-08-17 DIAGNOSIS — H35342 Macular cyst, hole, or pseudohole, left eye: Secondary | ICD-10-CM | POA: Diagnosis not present

## 2023-08-17 DIAGNOSIS — H43813 Vitreous degeneration, bilateral: Secondary | ICD-10-CM

## 2023-08-17 DIAGNOSIS — M816 Localized osteoporosis [Lequesne]: Secondary | ICD-10-CM

## 2023-09-28 DIAGNOSIS — H02839 Dermatochalasis of unspecified eye, unspecified eyelid: Secondary | ICD-10-CM | POA: Diagnosis not present

## 2023-10-12 ENCOUNTER — Ambulatory Visit (INDEPENDENT_AMBULATORY_CARE_PROVIDER_SITE_OTHER): Payer: Medicare HMO

## 2023-10-12 VITALS — BP 110/72 | Ht 61.5 in | Wt 125.0 lb

## 2023-10-12 DIAGNOSIS — Z2821 Immunization not carried out because of patient refusal: Secondary | ICD-10-CM | POA: Diagnosis not present

## 2023-10-12 DIAGNOSIS — Z0000 Encounter for general adult medical examination without abnormal findings: Secondary | ICD-10-CM | POA: Diagnosis not present

## 2023-10-12 NOTE — Progress Notes (Signed)
 Because this visit was a virtual/telehealth visit,  certain criteria was not obtained, such a blood pressure, CBG if applicable, and timed get up and go. Any medications not marked as taking were not mentioned during the medication reconciliation part of the visit. Any vitals not documented were not able to be obtained due to this being a telehealth visit or patient was unable to self-report a recent blood pressure reading due to a lack of equipment at home via telehealth. Vitals that have been documented are verbally provided by the patient.   This visit was performed by a medical professional under my direct supervision. I was immediately available for consultation/collaboration. I have reviewed and agree with the Annual Wellness Visit documentation.  Subjective:   Rhonda Reeves is a 68 y.o. who presents for a Medicare Wellness preventive visit.  As a reminder, Annual Wellness Visits don't include a physical exam, and some assessments may be limited, especially if this visit is performed virtually. We may recommend an in-person follow-up visit with your provider if needed.  Visit Complete: Virtual I connected with  Damien DELENA Class on 10/12/23 by a audio enabled telemedicine application and verified that I am speaking with the correct person using two identifiers.  Patient Location: Home  Provider Location: Home Office  I discussed the limitations of evaluation and management by telemedicine. The patient expressed understanding and agreed to proceed.  Vital Signs: Because this visit was a virtual/telehealth visit, some criteria may be missing or patient reported. Any vitals not documented were not able to be obtained and vitals that have been documented are patient reported.  VideoDeclined- This patient declined Librarian, academic. Therefore the visit was completed with audio only.  Persons Participating in Visit: Patient.  AWV Questionnaire: No: Patient  Medicare AWV questionnaire was not completed prior to this visit.  Cardiac Risk Factors include: advanced age (>34men, >67 women)     Objective:    Today's Vitals   10/12/23 1110  BP: 110/72  Weight: 125 lb (56.7 kg)  Height: 5' 1.5 (1.562 m)   Body mass index is 23.24 kg/m.     10/12/2023   11:09 AM 10/09/2022    8:36 AM 05/12/2021    9:25 AM 02/05/2021    9:16 AM 01/22/2021    1:00 PM 01/18/2021   11:23 AM 02/02/2020    4:42 PM  Advanced Directives  Does Patient Have a Medical Advance Directive? Yes Yes No Yes No No Yes  Type of Estate agent of [place];Living will Healthcare Power of [place];Living will  Healthcare Power of [place];Living will   Healthcare Power of [place];Living will  Does patient want to make changes to medical advance directive? No - Patient declined No - Patient declined  No - Patient declined   No - Patient declined  Copy of Healthcare Power of Attorney in Chart? Yes - validated most recent copy scanned in chart (See row information) Yes - validated most recent copy scanned in chart (See row information)  Yes - validated most recent copy scanned in chart (See row information)   No - copy requested  Would patient like information on creating a medical advance directive?   No - Patient declined  No - Patient declined No - Patient declined     Current Medications (verified) Outpatient Encounter Medications as of 10/12/2023  Medication Sig   alendronate  (FOSAMAX ) 70 MG tablet TAKE ONE TABLET BY MOUTH ONCE WEEKLY WITH A FULL GLASS OF WATER ON AN EMPTY  STOMACH   aspirin  81 MG EC tablet Take 1 tablet (81 mg total) by mouth daily. Swallow whole.   atorvastatin  (LIPITOR) 10 MG tablet TAKE ONE TABLET BY MOUTH ONCE A DAY   Calcium  Carb-Cholecalciferol (CALCIUM  600 + D PO) Take 1 tablet by mouth daily.   FLUoxetine  (PROZAC ) 10 MG capsule TAKE ONE CAPSULE BY MOUTH ONCE A DAY   lamoTRIgine  (LAMICTAL ) 200 MG tablet TAKE ONE TABLET BY MOUTH ONE  TIME DAILY   Multiple Vitamins-Minerals (MULTIVITAMIN WITH MINERALS) tablet Take 1 tablet by mouth daily.   OLANZapine  (ZYPREXA ) 2.5 MG tablet TAKE ONE TABLET BY MOUTH DAILY IN THE MORNING   No facility-administered encounter medications on file as of 10/12/2023.    Allergies (verified) Sulfa antibiotics   History: Past Medical History:  Diagnosis Date   Cataracts, bilateral    Closed fracture of upper end of left fibula 05/28/2021   Depression    Fracture of body of sternum, subsequent encounter for fracture with routine healing 05/28/2021   Glaucoma    History of kidney stones    H/O   Osteoporosis    Past Surgical History:  Procedure Laterality Date   ABDOMINAL HYSTERECTOMY     has 1 ovary left   BREAST BIOPSY Right 02/24/2018   affirm bx, coil clip, radial scar   BREAST EXCISIONAL BIOPSY Right 03/09/2018   Fibercystic change FOCAL USUAL DUCT HYPERPLASIA NEG MARGINS   BREAST LUMPECTOMY Right 03/09/2018   radial scar   BREAST LUMPECTOMY WITH NEEDLE LOCALIZATION Right 03/09/2018   Procedure: BREAST LUMPECTOMY WITH NEEDLE LOCALIZATION;  Surgeon: Marolyn Nest, MD;  Location: [facility];  Service: General;  Laterality: Right;   CATARACT EXTRACTION Bilateral    10/11/22 and 11/24/22   CHOLECYSTECTOMY N/A 11/21/2017   Procedure: LAPAROSCOPIC CHOLECYSTECTOMY WITH INTRAOPERATIVE CHOLANGIOGRAM;  Surgeon: Wonda Charlie BRAVO, MD;  Location: [facility];  Service: General;  Laterality: N/A;   ENDOSCOPIC RETROGRADE CHOLANGIOPANCREATOGRAPHY (ERCP) WITH PROPOFOL  N/A 11/20/2017   Procedure: ENDOSCOPIC RETROGRADE CHOLANGIOPANCREATOGRAPHY (ERCP) WITH PROPOFOL ;  Surgeon: Jinny Carmine, MD;  Location: [facility];  Service: Endoscopy;  Laterality: N/A;   HEMORRHOID SURGERY     Family History  Problem Relation Age of Onset   Osteoporosis Mother    Graves' disease Mother    Osteoporosis Father    Hernia Father    Heart disease Father    Kidney Stones Father    Hypotension Father     Osteoporosis Paternal Grandmother    Healthy Brother    Breast cancer Neg Hx    Social History   Socioeconomic History   Marital status: Married    Spouse name: belvie Orem   Number of children: 0   Years of education: bachelors degree   Highest education level: Bachelor's degree (e.g., BA, AB, BS)  Occupational History   Not on file  Tobacco Use   Smoking status: Former    Current packs/day: 0.00    Average packs/day: 1 pack/day for 35.0 years (35.0 ttl pk-yrs)    Types: Cigarettes    Start date: 03/03/1970    Quit date: 03/03/2005    Years since quitting: 18.6   Smokeless tobacco: Never  Vaping Use   Vaping status: Never Used  Substance and Sexual Activity   Alcohol use: Yes    Alcohol/week: 1.0 standard drink of alcohol    Types: 1 Cans of beer per week    Comment: 1 beer nightly   Drug use: No   Sexual activity: Yes    Partners: Male  Birth control/protection: Surgical, Post-menopausal  Other Topics Concern   Not on file  Social History Narrative   08/01/19   From[place] she came [place] settled [place]   Living: with husband Belvie (2006) - but also high school sweethearts   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      Family: mother in [place]  and brother in [place]  - good relationship      Enjoys: hike, gardening, watching movies      Exercise: walking 5 nights a week, stretches and weights in the morning   Diet: oatmeal breakfast, yogurt/banana/apple lunch, brown rice + veggies, veggies w/ pasta, pizza - chicken/salmon       Safety   Seat belts: Yes    Guns: no   Safe in relationships: Yes    Social Drivers of Corporate investment banker Strain: Low Risk  (03/10/2023)   Overall Financial Resource Strain (CARDIA)    Difficulty of Paying Living Expenses: Not hard at all  Food Insecurity: No Food Insecurity (10/12/2023)   Hunger Vital Sign    Worried About Running Out of Food in the Last Year: Never true    Ran Out of Food in the Last Year: Never true   Transportation Needs: No Transportation Needs (10/12/2023)   PRAPARE - Administrator, Civil Service (Medical): No    Lack of Transportation (Non-Medical): No  Physical Activity: Sufficiently Active (10/12/2023)   Exercise Vital Sign    Days of Exercise per Week: 5 days    Minutes of Exercise per Session: 30 min  Stress: No Stress Concern Present (10/12/2023)   [employer] of Occupational Health - Occupational Stress Questionnaire    Feeling of Stress: Not at all  Social Connections: Moderately Integrated (03/10/2023)   Social Connection and Isolation Panel    Frequency of Communication with Friends and Family: Once a week    Frequency of Social Gatherings with Friends and Family: Once a week    Attends Religious Services: More than 4 times per year    Active Member of [employer] or Organizations: Yes    Attends Engineer, structural: More than 4 times per year    Marital Status: Married    Tobacco Counseling Counseling given: Not Answered    Clinical Intake:  Pre-visit preparation completed: Yes  Pain : No/denies pain     BMI - recorded: 23.24 Nutritional Status: BMI of 19-24  Normal Nutritional Risks: None Diabetes: No  No results found for: HGBA1C   How often do you need to have someone help you when you read instructions, pamphlets, or other written materials from your doctor or pharmacy?: 1 - Never What is the last grade level you completed in school?: Degree  Interpreter Needed?: No  Information entered by :: Lyle Anderson LATHER   Activities of Daily Living     10/12/2023   11:12 AM  In your present state of health, do you have any difficulty performing the following activities:  Hearing? 0  Vision? 0  Difficulty concentrating or making decisions? 0  Walking or climbing stairs? 0  Dressing or bathing? 0  Doing errands, shopping? 0  Preparing Food and eating ? N  Using the Toilet? N  In the past six months, have you accidently  leaked urine? N  Do you have problems with loss of bowel control? N  Managing your Medications? N  Managing your Finances? N  Housekeeping or managing your Housekeeping? N    Patient Care Team: Corwin Antu, FNP  as PCP - General (Family Medicine) Alvia Norleen BIRCH, MD as Consulting Physician (Ophthalmology)  I have updated your Care Teams any recent Medical Services you may have received from other providers in the past year.     Assessment:   This is a routine wellness examination for Laelyn.  Hearing/Vision screen Hearing Screening - Comments:: Patient has some ringing in the ears  Vision Screening - Comments:: Patient wears glasses    Goals Addressed             This Visit's Progress    Patient Stated   On track    No new goals       Depression Screen     10/12/2023   11:14 AM 03/13/2023   10:12 AM 10/09/2022    8:35 AM 08/18/2022    9:18 AM 05/27/2022   10:50 AM 02/10/2022   11:18 AM 02/10/2022   10:45 AM  PHQ 2/9 Scores  PHQ - 2 Score 0 0 0 0 0 0 0  PHQ- 9 Score 0 0   0 0     Fall Risk     10/12/2023   11:11 AM 03/13/2023   10:11 AM 10/09/2022    8:38 AM 10/08/2022    2:01 PM 08/18/2022    9:18 AM  Fall Risk   Falls in the past year? 0 0 0 0 0  Number falls in past yr: 0 0 0 0 0  Injury with Fall? 0 0 0 0 0  Risk for fall due to : No Fall Risks No Fall Risks No Fall Risks    Follow up Falls evaluation completed Falls evaluation completed Falls prevention discussed;Falls evaluation completed  Falls evaluation completed;Education provided;Falls prevention discussed    MEDICARE RISK AT HOME:  Medicare Risk at Home Any stairs in or around the home?: Yes If so, are there any without handrails?: No Home free of loose throw rugs in walkways, pet beds, electrical cords, etc?: Yes Adequate lighting in your home to reduce risk of falls?: Yes Life alert?: No Use of a cane, walker or w/c?: No Grab bars in the bathroom?: No Shower chair or bench in shower?:  No Elevated toilet seat or a handicapped toilet?: No  TIMED UP AND GO:  Was the test performed?  No  Cognitive Function: 6CIT completed        10/12/2023   11:11 AM 10/09/2022    8:42 AM  6CIT Screen  What Year? 0 points 0 points  What month? 0 points 0 points  What time? 0 points 0 points  Count back from 20 0 points 0 points  Months in reverse 0 points 0 points  Repeat phrase 0 points 0 points  Total Score 0 points 0 points    Immunizations Immunization History  Administered Date(s) Administered   Fluad Quad(high Dose 65+) 01/10/2020, 01/04/2021, 12/28/2021   Influenza Inj Mdck Quad Pf 02/07/2017   Influenza,inj,Quad PF,6+ Mos 06/26/2009, 01/18/2015, 01/30/2016, 01/10/2019   Influenza-Unspecified 12/31/2022   PFIZER Comirnaty(Gray Top)Covid-19 Tri-Sucrose Vaccine 01/24/2022   PFIZER(Purple Top)SARS-COV-2 Vaccination 07/23/2019, 08/17/2019, 02/04/2020   Pfizer Covid-19 Vaccine Bivalent Booster 53yrs & up 02/05/2021   Pneumococcal Conjugate-13 01/10/2020   Pneumococcal Polysaccharide-23 02/10/2022   Tdap 07/04/2014   Zoster Recombinant(Shingrix) 08/03/2021, 10/06/2021   Zoster, Live 03/29/2015    Screening Tests Health Maintenance  Topic Date Due   COVID-19 Vaccine (6 - 2024-25 season) 12/21/2022   INFLUENZA VACCINE  11/20/2023   DTaP/Tdap/Td (2 - Td or Tdap) 07/03/2024  Medicare Annual Wellness (AWV)  10/11/2024   MAMMOGRAM  03/04/2025   Colonoscopy  03/25/2026   Pneumococcal Vaccine: 50+ Years  Completed   DEXA SCAN  Completed   Hepatitis C Screening  Completed   Zoster Vaccines- Shingrix  Completed   HPV VACCINES  Aged Out   Meningococcal B Vaccine  Aged Out    Health Maintenance  Health Maintenance Due  Topic Date Due   COVID-19 Vaccine (6 - 2024-25 season) 12/21/2022   Health Maintenance Items Addressed:   Additional Screening:  Vision Screening: Recommended annual ophthalmology exams for early detection of glaucoma and other disorders of the  eye. Would you like a referral to an eye doctor? No    Dental Screening: Recommended annual dental exams for proper oral hygiene  Community Resource Referral / Chronic Care Management: CRR required this visit?  No   CCM required this visit?  No   Plan:    I have personally reviewed and noted the following in the patient's chart:   Medical and social history Use of alcohol, tobacco or illicit drugs  Current medications and supplements including opioid prescriptions. Patient is not currently taking opioid prescriptions. Functional ability and status Nutritional status Physical activity Advanced directives List of other physicians Hospitalizations, surgeries, and ER visits in previous 12 months Vitals Screenings to include cognitive, depression, and falls Referrals and appointments  In addition, I have reviewed and discussed with patient certain preventive protocols, quality metrics, and best practice recommendations. A written personalized care plan for preventive services as well as general preventive health recommendations were provided to patient.   Lyle MARLA Right, [place]   10/12/2023   After Visit Summary: (MyChart) Due to this being a telephonic visit, the after visit summary with patients personalized plan was offered to patient via MyChart   Notes: Nothing significant to report at this time.

## 2023-10-12 NOTE — Patient Instructions (Signed)
 Ms. Zemanek , Thank you for taking time out of your busy schedule to complete your Annual Wellness Visit with me. I enjoyed our conversation and look forward to speaking with you again next year. I, as well as your care team,  appreciate your ongoing commitment to your health goals. Please review the following plan we discussed and let me know if I can assist you in the future. Your Game plan/ To Do List    Referrals: If you haven't heard from the office you've been referred to, please reach out to them at the phone provided.  none Follow up Visits: Next Medicare AWV with our clinical staff: 10/13/2024   Have you seen your provider in the last 6 months (3 months if uncontrolled diabetes)? No Next Office Visit with your provider: 03/14/2024  Clinician Recommendations:  Aim for 30 minutes of exercise or brisk walking, 6-8 glasses of water, and 5 servings of fruits and vegetables each day.       This is a list of the screening recommended for you and due dates:  Health Maintenance  Topic Date Due   COVID-19 Vaccine (6 - 2024-25 season) 12/21/2022   Flu Shot  11/20/2023   DTaP/Tdap/Td vaccine (2 - Td or Tdap) 07/03/2024   Medicare Annual Wellness Visit  10/11/2024   Mammogram  03/04/2025   Colon Cancer Screening  03/25/2026   Pneumococcal Vaccine for age over 31  Completed   DEXA scan (bone density measurement)  Completed   Hepatitis C Screening  Completed   Zoster (Shingles) Vaccine  Completed   HPV Vaccine  Aged Out   Meningitis B Vaccine  Aged Out    Advanced directives: has documents in her chart Advance Care Planning is important because it:  [x]  Makes sure you receive the medical care that is consistent with your values, goals, and preferences  [x]  It provides guidance to your family and loved ones and reduces their decisional burden about whether or not they are making the right decisions based on your wishes.  Follow the link provided in your after visit summary or read over  the paperwork we have mailed to you to help you started getting your Advance Directives in place. If you need assistance in completing these, please reach out to us  so that we can help you!  See attachments for Preventive Care and Fall Prevention Tips.

## 2023-10-27 ENCOUNTER — Other Ambulatory Visit: Payer: Self-pay | Admitting: Family

## 2023-11-07 ENCOUNTER — Other Ambulatory Visit: Payer: Self-pay | Admitting: Family

## 2023-11-07 DIAGNOSIS — M816 Localized osteoporosis [Lequesne]: Secondary | ICD-10-CM

## 2023-11-24 ENCOUNTER — Ambulatory Visit (INDEPENDENT_AMBULATORY_CARE_PROVIDER_SITE_OTHER)
Admission: RE | Admit: 2023-11-24 | Discharge: 2023-11-24 | Disposition: A | Discharge status: Home / Self Care | Source: Ambulatory Visit | Attending: Family

## 2023-11-24 ENCOUNTER — Ambulatory Visit: Payer: Self-pay | Admitting: *Deleted

## 2023-11-24 ENCOUNTER — Encounter: Payer: Self-pay | Admitting: Family

## 2023-11-24 ENCOUNTER — Ambulatory Visit: Payer: Self-pay | Admitting: Family

## 2023-11-24 ENCOUNTER — Ambulatory Visit (INDEPENDENT_AMBULATORY_CARE_PROVIDER_SITE_OTHER): Admitting: Family

## 2023-11-24 VITALS — BP 124/76 | HR 84 | Temp 98.4°F | Ht 61.5 in | Wt 128.4 lb

## 2023-11-24 DIAGNOSIS — S99912A Unspecified injury of left ankle, initial encounter: Secondary | ICD-10-CM

## 2023-11-24 DIAGNOSIS — S299XXA Unspecified injury of thorax, initial encounter: Secondary | ICD-10-CM | POA: Insufficient documentation

## 2023-11-24 DIAGNOSIS — R0781 Pleurodynia: Secondary | ICD-10-CM | POA: Diagnosis not present

## 2023-11-24 DIAGNOSIS — Z79899 Other long term (current) drug therapy: Secondary | ICD-10-CM

## 2023-11-24 DIAGNOSIS — M25572 Pain in left ankle and joints of left foot: Secondary | ICD-10-CM | POA: Diagnosis not present

## 2023-11-24 MED ORDER — OMEPRAZOLE 20 MG PO CPDR
20.0000 mg | DELAYED_RELEASE_CAPSULE | Freq: Every day | ORAL | 1 refills | Status: DC
Start: 2023-11-24 — End: 2023-12-16

## 2023-11-24 MED ORDER — ACETAMINOPHEN-CODEINE 300-30 MG PO TABS: 1.0000 | ORAL_TABLET | Freq: Four times a day (QID) | ORAL | 0 refills | Status: AC | PRN

## 2023-11-24 NOTE — Assessment & Plan Note (Signed)
 Stat left ankle xray  R/o acute fracture Ice/heat as tolerated Ankle compression recommended

## 2023-11-24 NOTE — Assessment & Plan Note (Signed)
 Chest and rib xray today to r/o acute fracture.  Incentive spirometer d/w pt and gave her one in office to utilize.  Printout given well on how to use.  Rx tylenol  codeine  for breakthrough pain Tylenol  prn but watch total tylenol  for all sources Can use ibuprofen however rarely, and risk for GI bleed discussed. Will give omeprazole  while needing to use, if she needs to use for stomach lining protection.  Pillow to relieve pressure with aggravating factors.  Ice/heat to site prn

## 2023-11-24 NOTE — Progress Notes (Signed)
 Established Patient Office Visit  Subjective:   Patient ID: Rhonda Reeves, female    DOB: 07/03/1955  Age: 68 y.o. MRN: [id number]  CC:  Chief Complaint  Patient presents with   Acute Visit    Had a fall 2 days ago. Now has pain in her L foot and L sided rib pain.    HPI: Rhonda Reeves is a 68 y.o. female presenting on 11/24/2023 for Acute Visit (Had a fall 2 days ago. Now has pain in her L foot and L sided rib pain.)  Clemens down the steps last week down the stairs and landed on her left arm that was folded across her chest. Since the fall she states she has point tenderness to left midline of rib aspect on her lateral side and hurts to breath/sneeze or take a deep breath. Has not really improved. Has noticed some left lower ankle pain and some bruising with swelling around the ankle. She can bear weight and doesn't have pain when she walks. She has used ice with some relief.   Takes ibuprofen with some mild relief.  Sleeps ok at night time as long a she doesn't lie on this side.   Can get up to 7/10 pain with sneezing or aggravating factors.          ROS: Negative unless specifically indicated above in HPI.   Relevant past medical history reviewed and updated as indicated.   Allergies and medications reviewed and updated.   Current Outpatient Medications:    acetaminophen -codeine  (TYLENOL  #3) 300-30 MG tablet, Take 1 tablet by mouth every 6 (six) hours as needed for up to 5 days for moderate pain (pain score 4-6)., Disp: 20 tablet, Rfl: 0   alendronate  (FOSAMAX ) 70 MG tablet, TAKE ONE TABLET BY MOUTH ONCE WEEKLY WITH A FULL GLASS OF WATER ON AN EMPTY STOMACH, Disp: 12 tablet, Rfl: 0   aspirin  81 MG EC tablet, Take 1 tablet (81 mg total) by mouth daily. Swallow whole., Disp: 90 tablet, Rfl: 3   atorvastatin  (LIPITOR) 10 MG tablet, TAKE ONE TABLET BY MOUTH ONCE A DAY, Disp: 90 tablet, Rfl: 3   Calcium  Carb-Cholecalciferol (CALCIUM  600 + D PO), Take 1 tablet by mouth daily.,  Disp: , Rfl:    FLUoxetine  (PROZAC ) 10 MG capsule, TAKE ONE CAPSULE BY MOUTH ONCE A DAY, Disp: 90 capsule, Rfl: 3   lamoTRIgine  (LAMICTAL ) 200 MG tablet, TAKE ONE TABLET BY MOUTH ONE TIME DAILY, Disp: 90 tablet, Rfl: 3   Multiple Vitamins-Minerals (MULTIVITAMIN WITH MINERALS) tablet, Take 1 tablet by mouth daily., Disp: , Rfl:    OLANZapine  (ZYPREXA ) 2.5 MG tablet, TAKE ONE TABLET BY MOUTH DAILY IN THE MORNING, Disp: 90 tablet, Rfl: 0   omeprazole  (PRILOSEC) 20 MG capsule, Take 1 capsule (20 mg total) by mouth daily., Disp: 30 capsule, Rfl: 1  Allergies  Allergen Reactions   Sulfa Antibiotics Nausea Only and Other (See Comments)    Unknown    Objective:   BP 124/76 (BP Location: Right Arm, Patient Position: Sitting, Cuff Size: Normal)   Pulse 84   Temp 98.4 F (36.9 C) (Temporal)   Ht 5' 1.5 (1.562 m)   Wt 128 lb 6.4 oz (58.2 kg)   SpO2 98%   BMI 23.87 kg/m    Physical Exam Vitals reviewed.  Constitutional:      General: She is not in acute distress.    Appearance: Normal appearance. She is normal weight. She is not ill-appearing, toxic-appearing or diaphoretic.  HENT:     Head: Normocephalic.  Cardiovascular:     Rate and Rhythm: Normal rate.     Pulses:          Dorsalis pedis pulses are 2+ on the left side.       Posterior tibial pulses are 2+ on the left side.  Pulmonary:     Effort: Pulmonary effort is normal.  Musculoskeletal:        General: Normal range of motion.     Left ankle: Swelling and ecchymosis present. Tenderness present over the lateral malleolus. Normal range of motion.  Feet:     Left foot:     Skin integrity: Skin integrity normal. No erythema (point tenderness mallelous).  Neurological:     General: No focal deficit present.     Mental Status: She is alert and oriented to person, place, and time. Mental status is at baseline.  Psychiatric:        Mood and Affect: Mood normal.        Behavior: Behavior normal.        Thought Content: Thought  content normal.        Judgment: Judgment normal.     Assessment & Plan:  Left ankle injury, initial encounter Assessment & Plan: Stat left ankle xray  R/o acute fracture Ice/heat as tolerated Ankle compression recommended   Orders: -     DG Ankle Complete Left; Future  Traumatic injury of rib Assessment & Plan: Chest and rib xray today to r/o acute fracture.  Incentive spirometer d/w pt and gave her one in office to utilize.  Printout given well on how to use.  Rx tylenol  codeine  for breakthrough pain Tylenol  prn but watch total tylenol  for all sources Can use ibuprofen however rarely, and risk for GI bleed discussed. Will give omeprazole  while needing to use, if she needs to use for stomach lining protection.  Pillow to relieve pressure with aggravating factors.  Ice/heat to site prn    Orders: -     DG Ribs Unilateral W/Chest Left -     Acetaminophen -Codeine ; Take 1 tablet by mouth every 6 (six) hours as needed for up to 5 days for moderate pain (pain score 4-6).  Dispense: 20 tablet; Refill: 0  High risk medication use -     Omeprazole ; Take 1 capsule (20 mg total) by mouth daily.  Dispense: 30 capsule; Refill: 1     Follow up plan: Return in about 3 weeks (around 12/15/2023) for f/u chest and ankle injury .  Ginger Patrick, FNP

## 2023-11-24 NOTE — Telephone Encounter (Signed)
 NOTED

## 2023-11-24 NOTE — Telephone Encounter (Signed)
 FYI Only or Action Required?: FYI only for provider.  Patient was last seen in primary care on 03/13/2023 by Corwin Antu, FNP.  Called Nurse Triage reporting Chest Injury. Pain in left ribs after a fall 2 days ago.   Missed bottom step.  No shortness of breath but just wants to have it checked.  Symptoms began several days ago. 2 days ago  Interventions attempted: Ice/heat application.  Symptoms are: unchanged.  Triage Disposition: See Physician Within 24 Hours  Patient/caregiver understands and will follow disposition?: Yes  Copied from CRM [id number]. Topic: Clinical - Red Word Triage >> Nov 24, 2023  7:59 AM Donna BRAVO wrote: Red Word that prompted transfer to Nurse Triage: patient husband Belvie calling, patient fell a couple of days ago, rib pain on left side, Reason for Disposition  [1] MODERATE pain (e.g., interferes with normal activities) AND [2] high-risk adult (e.g., age > 60 years, osteoporosis, chronic steroid use)  Answer Assessment - Initial Assessment Questions 1. MECHANISM: How did the injury happen?     Belvie husband calling in.   She fell 2 days ago.   She missed the bottom step and fell.   Husband with her when she fell.   She is having pain in left ribs.    Her ankle had bruising.  We iced it. It's ok and she is walking on it.    We were away so she didn't tell me about the rib pain until we got home.     2. ONSET: When did the injury happen? (.e.g., minutes, hours, days ago)     2 days ago Breathing is fine.    3. LOCATION: Where on the chest is the injury located?     Left chest ribs.   4. APPEARANCE: What does the injury look like?     Did not see any bruising over ribs. 5. BLEEDING: Is there any bleeding now? If Yes, ask: How long has it been bleeding?     No 6. SEVERITY: Any difficulty with breathing?     No problems with breathing. 7. SIZE: For cuts, bruises, or swelling, ask: How large is it? (e.g., inches or centimeters)     No  bruising 8. PAIN: Is there pain? If Yes, ask: How bad is the pain? (e.g., Scale 0-10; none, mild, moderate, severe)     Yes 9. TETANUS: For any breaks in the skin, ask: When was your last tetanus booster?     Not asked 10. PREGNANCY: Is there any chance you are pregnant? When was your last menstrual period?       N/A due to age  Protocols used: Chest Injury-A-AH

## 2023-12-15 ENCOUNTER — Encounter: Payer: Self-pay | Admitting: Family

## 2023-12-15 ENCOUNTER — Ambulatory Visit (INDEPENDENT_AMBULATORY_CARE_PROVIDER_SITE_OTHER): Admitting: Family

## 2023-12-15 VITALS — BP 120/74 | HR 72 | Temp 98.4°F | Wt 130.0 lb

## 2023-12-15 DIAGNOSIS — M25572 Pain in left ankle and joints of left foot: Secondary | ICD-10-CM | POA: Diagnosis not present

## 2023-12-15 MED ORDER — MELOXICAM 7.5 MG PO TABS: 7.5000 mg | ORAL_TABLET | Freq: Every day | ORAL | 1 refills | Status: AC

## 2023-12-15 NOTE — Patient Instructions (Signed)
 [facility] at [facility]. [place], [place] [address] [phone]

## 2023-12-15 NOTE — Progress Notes (Signed)
 Established Patient Office Visit  Subjective:      CC:  Chief Complaint  Patient presents with   Follow-up    HPI: Rhonda Reeves is a 68 y.o. female presenting on 12/15/2023 for Follow-up .  Discussed the use of AI scribe software for clinical note transcription with the patient, who gave verbal consent to proceed.  History of Present Illness Rhonda Reeves is a 68 year old female who presents for follow-up after a fall resulting in left rib and ankle injuries.  She experienced a fall down the steps prior to her last visit on August 5th, resulting in left rib and ankle injuries. Initially, she had point tenderness on the left side of her rib, causing pain with breathing or sneezing. There is significant improvement in her rib pain, which is now much better. She notes some stiffness when sleeping on that side, but it resolves with movement. She can sneeze and laugh without pain.  Regarding her left ankle, she reports persistent swelling, particularly noticeable by the end of the day. The swelling is less pronounced in the morning but increases with activity. She recalls that her previous x-ray did not show any fractures, but she was told there were bone spurs present. Pain is primarily in the area of the plantar spur, and swelling is exacerbated by walking. She manages the pain with Advil, which she finds effective, and uses ice for swelling. She has also tried Salonpas and lidocaine  patches, which provide some relief. She is currently on aspirin  and omeprazole .  She has a history of spurs identified about a year ago and has seen a podiatrist in the past. She has not received any steroid injections for the spurs. She uses supportive shoes and a brace for stability, which helps with pain but not swelling.         Social history:  Relevant past medical, surgical, family and social history reviewed and updated as indicated. Interim medical history since our last visit  reviewed.  Allergies and medications reviewed and updated.  DATA REVIEWED: CHART IN EPIC     ROS: Negative unless specifically indicated above in HPI.    Current Outpatient Medications:    alendronate  (FOSAMAX ) 70 MG tablet, TAKE ONE TABLET BY MOUTH ONCE WEEKLY WITH A FULL GLASS OF WATER ON AN EMPTY STOMACH, Disp: 12 tablet, Rfl: 0   aspirin  81 MG EC tablet, Take 1 tablet (81 mg total) by mouth daily. Swallow whole., Disp: 90 tablet, Rfl: 3   atorvastatin  (LIPITOR) 10 MG tablet, TAKE ONE TABLET BY MOUTH ONCE A DAY, Disp: 90 tablet, Rfl: 3   Calcium  Carb-Cholecalciferol (CALCIUM  600 + D PO), Take 1 tablet by mouth daily., Disp: , Rfl:    FLUoxetine  (PROZAC ) 10 MG capsule, TAKE ONE CAPSULE BY MOUTH ONCE A DAY, Disp: 90 capsule, Rfl: 3   lamoTRIgine  (LAMICTAL ) 200 MG tablet, TAKE ONE TABLET BY MOUTH ONE TIME DAILY, Disp: 90 tablet, Rfl: 3   meloxicam  (MOBIC ) 7.5 MG tablet, Take 1 tablet (7.5 mg total) by mouth daily., Disp: 30 tablet, Rfl: 1   Multiple Vitamins-Minerals (MULTIVITAMIN WITH MINERALS) tablet, Take 1 tablet by mouth daily., Disp: , Rfl:    OLANZapine  (ZYPREXA ) 2.5 MG tablet, TAKE ONE TABLET BY MOUTH DAILY IN THE MORNING, Disp: 90 tablet, Rfl: 0   omeprazole  (PRILOSEC) 20 MG capsule, Take 1 capsule (20 mg total) by mouth daily., Disp: 30 capsule, Rfl: 1        Objective:  BP 120/74 (BP Location: Right Arm, Patient Position: Sitting, Cuff Size: Normal)   Pulse 72   Temp 98.4 F (36.9 C) (Temporal)   Wt 130 lb (59 kg)   SpO2 98%   BMI 24.17 kg/m   Physical Exam EXTREMITIES: Swelling and tenderness in the left ankle.  Wt Readings from Last 3 Encounters:  12/15/23 130 lb (59 kg)  11/24/23 128 lb 6.4 oz (58.2 kg)  10/12/23 125 lb (56.7 kg)    Physical Exam Vitals reviewed.  Musculoskeletal:     Left ankle: Tenderness present over the lateral malleolus. Normal range of motion.     Left Achilles Tendon: Normal.          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 X-ray shows no fracture or dislocation, but mild tibial-talar degenerative spurring and small plantar calcaneal spur. Pain and swelling may be due to arthritis and spurs causing inflammation. Current management with ibuprofen is effective, but there is a risk of GI bleed due to concurrent aspirin  use. Meloxicam  is preferred due to its less aggressive effect on the stomach, especially with concurrent omeprazole  use. Symptoms may persist for up to six months due to a possible sprain. - Prescribe meloxicam  daily to reduce inflammation and pain. - Continue using Tylenol  for breakthrough pain. - Use lidocaine  patches and light application of Voltaren gel as needed. - Apply ice or heat based on comfort. - Wear supportive shoes and consider ortho inserts for flat shoes. - Perform foot exercises such as rolling foot on an ice bottle and stretching. - Avoid NSAIDs other than meloxicam . - If symptoms persist, consider referral to podiatrist for possible steroid injection.  Healing left rib injury (likely fracture) Improvement in left rib pain, likely due to a healing fracture. Initial pain with breathing and sneezing has resolved. Some stiffness remains when sleeping on the affected side, but overall condition is much improved.  Recording duration: 13 minutes      Return if symptoms worsen or fail to improve.     Ginger Patrick, MSN, APRN, FNP-C [facility]

## 2023-12-16 ENCOUNTER — Other Ambulatory Visit: Payer: Self-pay | Admitting: Family

## 2023-12-16 DIAGNOSIS — Z79899 Other long term (current) drug therapy: Secondary | ICD-10-CM

## 2023-12-28 DIAGNOSIS — Z09 Encounter for follow-up examination after completed treatment for conditions other than malignant neoplasm: Secondary | ICD-10-CM | POA: Diagnosis not present

## 2024-01-24 ENCOUNTER — Other Ambulatory Visit: Payer: Self-pay | Admitting: Family

## 2024-01-28 ENCOUNTER — Other Ambulatory Visit: Payer: Self-pay | Admitting: Family

## 2024-01-28 DIAGNOSIS — M816 Localized osteoporosis [Lequesne]: Secondary | ICD-10-CM

## 2024-02-04 ENCOUNTER — Other Ambulatory Visit: Payer: Self-pay | Admitting: Family

## 2024-02-04 DIAGNOSIS — Z1231 Encounter for screening mammogram for malignant neoplasm of breast: Secondary | ICD-10-CM

## 2024-02-22 DIAGNOSIS — H43812 Vitreous degeneration, left eye: Secondary | ICD-10-CM | POA: Diagnosis not present

## 2024-03-08 ENCOUNTER — Ambulatory Visit
Admission: RE | Admit: 2024-03-08 | Discharge: 2024-03-08 | Disposition: A | Discharge status: Home / Self Care | Source: Ambulatory Visit | Attending: Family | Admitting: Family

## 2024-03-08 DIAGNOSIS — Z1231 Encounter for screening mammogram for malignant neoplasm of breast: Secondary | ICD-10-CM | POA: Insufficient documentation

## 2024-03-10 ENCOUNTER — Ambulatory Visit: Payer: Self-pay | Admitting: Family

## 2024-03-14 ENCOUNTER — Encounter: Payer: Self-pay | Admitting: Family

## 2024-03-14 ENCOUNTER — Ambulatory Visit: Admitting: Family

## 2024-03-14 VITALS — BP 120/74 | HR 73 | Temp 98.4°F | Ht 61.5 in | Wt 124.8 lb

## 2024-03-14 DIAGNOSIS — I700 Atherosclerosis of aorta: Secondary | ICD-10-CM

## 2024-03-14 DIAGNOSIS — E782 Mixed hyperlipidemia: Secondary | ICD-10-CM | POA: Diagnosis not present

## 2024-03-14 DIAGNOSIS — H9191 Unspecified hearing loss, right ear: Secondary | ICD-10-CM | POA: Diagnosis not present

## 2024-03-14 DIAGNOSIS — Z0000 Encounter for general adult medical examination without abnormal findings: Secondary | ICD-10-CM | POA: Diagnosis not present

## 2024-03-14 DIAGNOSIS — H9313 Tinnitus, bilateral: Secondary | ICD-10-CM

## 2024-03-14 LAB — COMPREHENSIVE METABOLIC PANEL WITH GFR
ALT: 13 U/L (ref 0–35)
AST: 19 U/L (ref 0–37)
Albumin: 4.7 g/dL (ref 3.5–5.2)
Alkaline Phosphatase: 42 U/L (ref 39–117)
BUN: 9 mg/dL (ref 6–23)
CO2: 31 meq/L (ref 19–32)
Calcium: 9.3 mg/dL (ref 8.4–10.5)
Chloride: 103 meq/L (ref 96–112)
Creatinine, Ser: 0.96 mg/dL (ref 0.40–1.20)
GFR: 60.37 mL/min (ref >60.00)
Glucose, Bld: 89 mg/dL (ref 70–99)
Potassium: 4.1 meq/L (ref 3.5–5.1)
Sodium: 141 meq/L (ref 135–145)
Total Bilirubin: 0.6 mg/dL (ref 0.2–1.2)
Total Protein: 6.8 g/dL (ref 6.0–8.3)

## 2024-03-14 LAB — LIPID PANEL
Cholesterol: 171 mg/dL (ref 0–200)
HDL: 81.3 mg/dL (ref >39.00)
LDL Cholesterol: 74 mg/dL (ref 0–99)
NonHDL: 89.99
Total CHOL/HDL Ratio: 2
Triglycerides: 78 mg/dL (ref 0.0–149.0)
VLDL: 15.6 mg/dL (ref 0.0–40.0)

## 2024-03-14 NOTE — Progress Notes (Signed)
 Subjective:  Patient ID: Rhonda Reeves Class, female    DOB: Nov 03, 1955  Age: 68 y.o. MRN: [id number]  Patient Care Team: Corwin Antu, FNP as PCP - General (Family Medicine) Alvia Norleen BIRCH, MD as Consulting Physician (Ophthalmology)   CC:  Chief Complaint  Patient presents with   Annual Exam    Would like to be referred to Audiology, she is having issues with her hearing and constant ringing in her ears.    HPI Rhonda Reeves is a 68 y.o. female who presents today for an annual physical exam. She reports consuming a general diet. Walks often throughout the week 4 good days out of the work She generally feels well. She reports sleeping fairly well. She does have additional problems to discuss today.   Vision:Within last year Dental:Receives regular dental care  Mammogram: 111/8/25 Colonoscopy: 03/26/23 every three years  Bone density scan: 2.12.25  Pt is with acute concerns.   Discussed the use of AI scribe software for clinical note transcription with the patient, who gave verbal consent to proceed.  History of Present Illness Rhonda Reeves is a 68 year old female who presents with left hip pain and worsening hearing loss.  She has been experiencing left hip pain for the past one to two months, noticeable when leaning on the hip or moving the leg in certain ways. The pain is localized to the hip without radiation down the leg and is intermittent, influenced by activities such as sitting or walking. It is exacerbated by walking, especially after getting out of the car, when she holds onto the car door for support. She has a history of right-sided sciatica, but this pain is on the left. She occasionally takes Advil, approximately every couple of weeks, which she finds effective. She no longer takes meloxicam  regularly. No shooting pain down the leg from the hip.  She has a long-standing issue with tinnitus, which began in her twenties. The ringing is intermittent. Her hearing has  progressively worsened, particularly in the right ear, where she struggles to hear high-pitched sounds like crickets and birds. She has not consulted a specialist for this issue since her twenties.  She walks regularly, about four to five days a week. She quit smoking 18 years ago after smoking for 35 years. She gets up once a night to urinate. She is up to date with her eye and dental exams, as well as her mammogram and colonoscopy. She has an advanced directive on file from 2019.   Advanced Directives Patient does have advanced directives i. She  have a copy in the electronic medical record.   DEPRESSION SCREENING    03/14/2024   11:28 AM 10/12/2023   11:14 AM 03/13/2023   10:12 AM 10/09/2022    8:35 AM 08/18/2022    9:18 AM 05/27/2022   10:50 AM 02/10/2022   11:18 AM  PHQ 2/9 Scores  PHQ - 2 Score 0 0 0 0 0 0 0  PHQ- 9 Score 0 0  0    0  0      Data saved with a previous flowsheet row definition     ROS: Negative unless specifically indicated above in HPI.    Current Outpatient Medications:    alendronate  (FOSAMAX ) 70 MG tablet, take 1 tablet by mouth once a week with a full glass of water on an empty stomach, Disp: 12 tablet, Rfl: 0   aspirin  81 MG EC tablet, Take 1 tablet (81 mg total) by mouth  daily. Swallow whole., Disp: 90 tablet, Rfl: 3   atorvastatin  (LIPITOR) 10 MG tablet, TAKE ONE TABLET BY MOUTH ONCE A DAY, Disp: 90 tablet, Rfl: 3   Calcium  Carb-Cholecalciferol (CALCIUM  600 + D PO), Take 1 tablet by mouth daily., Disp: , Rfl:    FLUoxetine  (PROZAC ) 10 MG capsule, TAKE ONE CAPSULE BY MOUTH ONCE A DAY, Disp: 90 capsule, Rfl: 3   lamoTRIgine  (LAMICTAL ) 200 MG tablet, TAKE ONE TABLET BY MOUTH ONE TIME DAILY, Disp: 90 tablet, Rfl: 3   meloxicam  (MOBIC ) 7.5 MG tablet, Take 1 tablet (7.5 mg total) by mouth daily., Disp: 30 tablet, Rfl: 1   Multiple Vitamins-Minerals (MULTIVITAMIN WITH MINERALS) tablet, Take 1 tablet by mouth daily., Disp: , Rfl:    OLANZapine  (ZYPREXA ) 2.5 MG  tablet, TAKE ONE TABLET BY MOUTH DAILY IN THE MORNING, Disp: 90 tablet, Rfl: 0   omeprazole  (PRILOSEC) 20 MG capsule, TAKE 1 CAPSULE BY MOUTH EVERY DAY, Disp: 90 capsule, Rfl: 1    Objective:    BP 120/74 (BP Location: Left Arm, Patient Position: Sitting, Cuff Size: Normal)   Pulse 73   Temp 98.4 F (36.9 C) (Temporal)   Ht 5' 1.5 (1.562 m)   Wt 124 lb 12.8 oz (56.6 kg)   SpO2 98%   BMI 23.20 kg/m   BP Readings from Last 3 Encounters:  03/14/24 120/74  12/15/23 120/74  11/24/23 124/76      Physical Exam Vitals reviewed.  Constitutional:      General: She is not in acute distress.    Appearance: Normal appearance. She is normal weight. She is not ill-appearing.  HENT:     Head: Normocephalic.     Right Ear: Tympanic membrane normal.     Left Ear: Tympanic membrane normal.     Nose: Nose normal.     Mouth/Throat:     Mouth: Mucous membranes are moist.  Eyes:     Extraocular Movements: Extraocular movements intact.     Pupils: Pupils are equal, round, and reactive to light.  Cardiovascular:     Rate and Rhythm: Normal rate and regular rhythm.  Pulmonary:     Effort: Pulmonary effort is normal.     Breath sounds: Normal breath sounds.  Abdominal:     General: Abdomen is flat. Bowel sounds are normal.     Palpations: Abdomen is soft.     Tenderness: There is no guarding or rebound.  Musculoskeletal:        General: Normal range of motion.     Cervical back: Normal range of motion.     Lumbar back: Normal.     Left hip: No bony tenderness. Normal range of motion.     Comments: Tenderness left IT band with tightness  Skin:    General: Skin is warm.     Capillary Refill: Capillary refill takes less than 2 seconds.  Neurological:     General: No focal deficit present.     Mental Status: She is alert.  Psychiatric:        Mood and Affect: Mood normal.        Behavior: Behavior normal.        Thought Content: Thought content normal.        Judgment: Judgment  normal.       Results       Assessment & Plan:   Assessment and Plan Assessment & Plan Adult Wellness Visit Routine adult wellness visit with no acute concerns. She is up to date with  mammogram, colonoscopy, eye, and dental exams. Tetanus vaccination is due next year. She has an advanced directive on file. - Ordered yearly labs - Continue routine health maintenance schedule -Patient Counseling(The following topics were reviewed):  Preventative care handout given to pt  Health maintenance and immunizations reviewed. Please refer to Health maintenance section. Pt advised on safe sex, wearing seatbelts in car, and proper nutrition labwork ordered today for annual Dental health: Discussed importance of regular tooth brushing, flossing, and dental visits.   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 No history of injury or fall. No radiation of pain down the leg. Physical exam reveals tightness in the iliotibial band. NSAIDs are not preferred due to concurrent aspirin  use. - Recommended Tylenol  Arthritis for pain management - Advised use of Voltaren gel for topical pain relief - Suggested heat application to the affected area - Provided stretches for iliotibial band syndrome - Recommended massage of the iliotibial band area  Bilateral tinnitus and right-sided hearing loss Bilateral tinnitus since her 20s, with worsening right-sided hearing loss. Tinnitus is intermittent and not constant. Hearing loss is more pronounced in the right ear, with difficulty hearing high-pitched sounds. Referral to audiology is recommended for further evaluation and management. - Referred to audiology for comprehensive hearing evaluation          Follow-up: Return in about 1 year (around 03/14/2025) for f/u CPE.   Rhonda Patrick, FNP

## 2024-03-21 ENCOUNTER — Ambulatory Visit: Payer: Self-pay | Admitting: Family

## 2024-04-04 ENCOUNTER — Encounter: Payer: Self-pay | Admitting: Audiology

## 2024-04-04 ENCOUNTER — Ambulatory Visit: Attending: Family | Admitting: Audiology

## 2024-04-04 DIAGNOSIS — H903 Sensorineural hearing loss, bilateral: Secondary | ICD-10-CM | POA: Diagnosis present

## 2024-04-04 NOTE — Procedures (Signed)
°  Outpatient Audiology and [facility] [address] [place], [place]  [address] [phone]  AUDIOLOGICAL  EVALUATION  NAME: BAYLA MCGOVERN     DOB:   1955/06/19      MRN: [id number]                                                                                     DATE: 04/04/2024     REFERENT: Corwin Antu, FNP STATUS: Outpatient DIAGNOSIS: sensorineural hearing loss   History: Kenedee was seen for an audiological evaluation due to concerns regarding her hearing sensitivity. Avanell was accompanied to the appointment by her husband. Luvia reports decreased hearing occurring for 20+ years. Marliyah reports bilateral tinnitus starting in her 35s. Aailyah reports she previously utilized hearing aids with little success. Paulena denies otalgia and aural fullness. She reports a history of dizziness and vertigo. Lorre reports difficulty communicating with her husband and hearing the television. Angalena reports that she cannot hear birds and crickets.   Evaluation:  Otoscopy showed a clear view of the tympanic membranes, bilaterally Tympanometry results were consistent with normal middle ear function (Type A), bilaterally.  Audiometric testing was completed using Conventional Audiometry techniques with insert earphones and TDH headphones. Test results are consistent with normal hearing sensitivity sloping to profound sensorineural hearing loss in both ears. Speech Recognition Thresholds were obtained at 35 dB HL in the right ear and at 35  dB HL in the left ear. Word Recognition Testing was completed at 75 dB HL and Billiejean scored 76% in the right ear and 88% in the left ear.   Results:  The test results were reviewed with Velia and her husband. Test results are consistent with normal hearing sensitivity sloping to profound sensorineural hearing loss in both ears. Topanga will have hearing and communication difficulty in all listening environments. She will benefit from the use of good  communication strategies and the use of hearing aids. Rayshell was given a copy of her audiogram today. Staphanie was given a list of hearing aid providers in the area.   Recommendations: 1.   Use of hearing aids 2.   Use of tinnitus management strategies 3.   Monitor hearing sensitivity     35 minutes spent testing and counseling on results.   If you have any questions please feel free to contact me at [phone].  Darryle Posey Audiologist, Au.D., CCC-A 04/04/2024  3:11 PM  Cc: Corwin Antu, FNP

## 2024-04-13 ENCOUNTER — Other Ambulatory Visit: Payer: Self-pay | Admitting: Family

## 2024-04-13 DIAGNOSIS — F3341 Major depressive disorder, recurrent, in partial remission: Secondary | ICD-10-CM

## 2024-04-13 DIAGNOSIS — F3175 Bipolar disorder, in partial remission, most recent episode depressed: Secondary | ICD-10-CM

## 2024-04-18 ENCOUNTER — Other Ambulatory Visit: Payer: Self-pay | Admitting: Family

## 2024-04-18 DIAGNOSIS — M816 Localized osteoporosis [Lequesne]: Secondary | ICD-10-CM

## 2024-04-24 ENCOUNTER — Other Ambulatory Visit: Payer: Self-pay | Admitting: Family

## 2024-04-24 DIAGNOSIS — I700 Atherosclerosis of aorta: Secondary | ICD-10-CM

## 2024-04-27 ENCOUNTER — Encounter

## 2024-05-06 ENCOUNTER — Other Ambulatory Visit: Payer: Self-pay | Admitting: Family

## 2024-05-14 ENCOUNTER — Other Ambulatory Visit: Payer: Self-pay | Admitting: Family

## 2024-05-14 DIAGNOSIS — F3175 Bipolar disorder, in partial remission, most recent episode depressed: Secondary | ICD-10-CM

## 2024-05-14 DIAGNOSIS — F3341 Major depressive disorder, recurrent, in partial remission: Secondary | ICD-10-CM

## 2024-08-15 ENCOUNTER — Encounter (INDEPENDENT_AMBULATORY_CARE_PROVIDER_SITE_OTHER): Admitting: Ophthalmology

## 2024-10-13 ENCOUNTER — Ambulatory Visit
